# Patient Record
Sex: Female | Born: 1950 | Race: White | Hispanic: No | Smoking: Never smoker
Health system: Southern US, Community
[De-identification: ages and names within clinical notes are randomized; demographics above are authoritative.]

## PROBLEM LIST (undated history)

## (undated) DIAGNOSIS — I10 Essential (primary) hypertension: Secondary | ICD-10-CM

## (undated) DIAGNOSIS — Z8619 Personal history of other infectious and parasitic diseases: Secondary | ICD-10-CM

## (undated) DIAGNOSIS — K219 Gastro-esophageal reflux disease without esophagitis: Secondary | ICD-10-CM

## (undated) DIAGNOSIS — Z87442 Personal history of urinary calculi: Secondary | ICD-10-CM

## (undated) DIAGNOSIS — G43909 Migraine, unspecified, not intractable, without status migrainosus: Secondary | ICD-10-CM

## (undated) DIAGNOSIS — F32A Depression, unspecified: Secondary | ICD-10-CM

## (undated) HISTORY — DX: Migraine, unspecified, not intractable, without status migrainosus: G43.909

## (undated) HISTORY — PX: ABDOMINAL HYSTERECTOMY: SHX81

## (undated) HISTORY — DX: Personal history of other infectious and parasitic diseases: Z86.19

## (undated) HISTORY — PX: APPENDECTOMY: SHX54

## (undated) HISTORY — DX: Depression, unspecified: F32.A

## (undated) HISTORY — PX: OTHER SURGICAL HISTORY: SHX169

---

## 2004-12-08 LAB — HM PAP SMEAR: HM Pap smear: NEGATIVE

## 2009-04-20 ENCOUNTER — Emergency Department: Payer: Self-pay | Admitting: Emergency Medicine

## 2013-09-10 LAB — HEPATIC FUNCTION PANEL
ALT: 29 U/L (ref 7–35)
AST: 30 U/L (ref 13–35)

## 2013-09-10 LAB — CBC AND DIFFERENTIAL
HCT: 48 % — AB (ref 36–46)
HEMOGLOBIN: 16.1 g/dL — AB (ref 12.0–16.0)
Platelets: 295 10*3/uL (ref 150–399)
WBC: 8.4 10^3/mL

## 2013-09-10 LAB — LIPID PANEL
Cholesterol: 232 mg/dL — AB (ref 0–200)
HDL: 51 mg/dL (ref 35–70)
LDL Cholesterol: 136 mg/dL
Triglycerides: 224 mg/dL — AB (ref 40–160)

## 2013-09-10 LAB — BASIC METABOLIC PANEL
BUN: 17 mg/dL (ref 4–21)
CREATININE: 0.9 mg/dL (ref 0.5–1.1)
GLUCOSE: 102 mg/dL
POTASSIUM: 5.4 mmol/L — AB (ref 3.4–5.3)
SODIUM: 143 mmol/L (ref 137–147)

## 2015-01-16 ENCOUNTER — Other Ambulatory Visit: Payer: Self-pay | Admitting: Family Medicine

## 2015-01-16 MED ORDER — LOVASTATIN 40 MG PO TABS: ORAL_TABLET | ORAL | Status: DC

## 2015-01-16 NOTE — Telephone Encounter (Signed)
Pt stated that she forgot about needing to get her labs done. Pt stated she promises to get her labs done next week. Pt would like the Lovastatin 40 mg sent [place] and she would like to get the lab slip next week. Pt request a call back to let her know the medication was called in and when her slip is ready for pick up. Thanks TNP

## 2015-01-17 ENCOUNTER — Telehealth: Payer: Self-pay

## 2015-01-17 ENCOUNTER — Telehealth: Payer: Self-pay | Admitting: Family Medicine

## 2015-01-17 DIAGNOSIS — E785 Hyperlipidemia, unspecified: Secondary | ICD-10-CM

## 2015-01-17 NOTE — Telephone Encounter (Signed)
Need more info. What blood work?

## 2015-01-17 NOTE — Telephone Encounter (Signed)
Patient request a lab slip to get blood work done. Call pt when ready for pickup

## 2015-01-20 NOTE — Telephone Encounter (Signed)
Pt called to see if lab slip was ready for pick up. Pt stated her understanding was in order to continue taking Lovastatin she needed to have blood work done and she thought it was time for the blood work to be done. Pt would like to get this done tomorrow morning if possible. Thanks TNP

## 2015-01-27 NOTE — Telephone Encounter (Signed)
Has she run out of lovastatin yet? We only prescribed 3 months worth in January, If she is out then need to start back before checking blood work. If she has not run out then go ahead and order lipid panel and ALT for hyperlipidemia. Thanks.

## 2015-01-28 NOTE — Telephone Encounter (Signed)
LMTCB 01/28/2015    Thanks,   -Subhan Hoopes  

## 2015-02-05 NOTE — Telephone Encounter (Signed)
Tried calling patient. Left message to call back. 

## 2015-02-13 NOTE — Telephone Encounter (Signed)
Left message to call back. Letter mailed to patient home.

## 2015-02-13 NOTE — Telephone Encounter (Signed)
Patient returned call from 01/17/2015 message. Patient states she is requesting a lab slip to have her routine labs done. Patient states she has been taking Lovastatin, and has not been out of the medication. Per Dr. Maralyn Sago note on 01/17/2015. Lipid panel and ALT labs ordered and lab slip is at the front desk for pick up. Patient states she will have labs done next week.

## 2015-02-19 LAB — LIPID PANEL
CHOL/HDL RATIO: 4.3 ratio (ref 0.0–4.4)
Cholesterol, Total: 203 mg/dL — ABNORMAL HIGH (ref 100–199)
HDL: 47 mg/dL (ref >39)
LDL Calculated: 111 mg/dL — ABNORMAL HIGH (ref 0–99)
Triglycerides: 225 mg/dL — ABNORMAL HIGH (ref 0–149)
VLDL Cholesterol Cal: 45 mg/dL — ABNORMAL HIGH (ref 5–40)

## 2015-02-19 LAB — ALT: ALT: 26 IU/L (ref 0–32)

## 2015-04-07 ENCOUNTER — Other Ambulatory Visit: Payer: Self-pay | Admitting: Family Medicine

## 2015-04-07 NOTE — Telephone Encounter (Signed)
Please call in clonazepam  

## 2015-04-08 NOTE — Telephone Encounter (Signed)
Please see if  Clonazepam has been called in. I thought this was done yesterday. Thanks.

## 2015-04-08 NOTE — Telephone Encounter (Signed)
Rx called in to pharmacy. 

## 2015-05-19 ENCOUNTER — Other Ambulatory Visit: Payer: Self-pay | Admitting: Family Medicine

## 2015-06-17 ENCOUNTER — Encounter: Payer: Self-pay | Admitting: Family Medicine

## 2015-06-17 ENCOUNTER — Ambulatory Visit (INDEPENDENT_AMBULATORY_CARE_PROVIDER_SITE_OTHER): Payer: 59 | Admitting: Family Medicine

## 2015-06-17 VITALS — BP 134/70 | HR 66 | Temp 98.5°F | Resp 16 | Ht 60.0 in | Wt 161.0 lb

## 2015-06-17 DIAGNOSIS — R51 Headache: Secondary | ICD-10-CM

## 2015-06-17 DIAGNOSIS — J309 Allergic rhinitis, unspecified: Secondary | ICD-10-CM | POA: Insufficient documentation

## 2015-06-17 DIAGNOSIS — G4700 Insomnia, unspecified: Secondary | ICD-10-CM | POA: Diagnosis not present

## 2015-06-17 DIAGNOSIS — H409 Unspecified glaucoma: Secondary | ICD-10-CM | POA: Insufficient documentation

## 2015-06-17 DIAGNOSIS — R519 Headache, unspecified: Secondary | ICD-10-CM | POA: Insufficient documentation

## 2015-06-17 DIAGNOSIS — F329 Major depressive disorder, single episode, unspecified: Secondary | ICD-10-CM | POA: Insufficient documentation

## 2015-06-17 DIAGNOSIS — E785 Hyperlipidemia, unspecified: Secondary | ICD-10-CM

## 2015-06-17 DIAGNOSIS — F32A Depression, unspecified: Secondary | ICD-10-CM | POA: Insufficient documentation

## 2015-06-17 DIAGNOSIS — Z8669 Personal history of other diseases of the nervous system and sense organs: Secondary | ICD-10-CM | POA: Insufficient documentation

## 2015-06-17 MED ORDER — LOVASTATIN 40 MG PO TABS
ORAL_TABLET | ORAL | Status: DC
Start: 2015-06-17 — End: 2016-03-09

## 2015-06-17 MED ORDER — CLONAZEPAM 1 MG PO TABS: 1.0000 mg | ORAL_TABLET | Freq: Every evening | ORAL | Status: DC | PRN

## 2015-06-17 NOTE — Progress Notes (Signed)
Patient: Gabrielle Barnes Female    DOB: [birth year]   64 y.o.   MRN: [id number] Visit Date: 06/17/2015  Today's Provider: Lelon Huh, MD   Chief Complaint  Patient presents with  . Insomnia    follow up   Subjective:    HPI   Lipid/Cholesterol, Follow-up:   Last seen for this10 months ago.  Management changes since that visit include no changes. . Last Lipid Panel:    Component Value Date/Time   CHOL 203* 02/18/2015 0809   CHOL 232* 09/10/2013   TRIG 225* 02/18/2015 0809   HDL 47 02/18/2015 0809   HDL 51 09/10/2013   CHOLHDL 4.3 02/18/2015 0809   LDLCALC 111* 02/18/2015 0809   LDLCALC 136 09/10/2013    Risk factors for vascular disease include hypercholesterolemia  She reports good compliance with treatment. She is not having side effects.  Current symptoms include none and have been stable. Weight trend: stable Prior visit with dietician: no Current diet: in general, a "healthy" diet   Current exercise: none  Wt Readings from Last 3 Encounters:  06/17/15 161 lb (73.029 kg)  11/25/14 160 lb (72.576 kg)    ------------------------------------------------------------------- Follow up Insomnia: Last office visit was 10 months ago and no changes were made. Patient reports good compliance with treatment, good tolerance and good symptom control. She takes clonazepam most nights and states it usually works well.      No Known Allergies Previous Medications   ASPIRIN 81 MG TABLET    Take 1 tablet by mouth daily.   CALCIUM CARBONATE (CALCIUM 600 PO)    Take 1 tablet by mouth daily.   CLONAZEPAM (KLONOPIN) 1 MG TABLET    TAKE ONE TABLET BY MOUTH AT BEDTIME AS NEEDED   LOVASTATIN (MEVACOR) 40 MG TABLET    TAKE TWO TABLETS BY MOUTH EVERY NIGHT AT BEDTIME    Review of Systems  Constitutional: Negative for fever, chills, appetite change and fatigue.  Respiratory: Negative for chest tightness and shortness of breath.   Cardiovascular: Negative for chest pain  and palpitations.  Gastrointestinal: Negative for nausea, vomiting and abdominal pain.  Musculoskeletal: Positive for myalgias (shooting pain in right leg; now resolved after eating bananas).  Neurological: Negative for dizziness and weakness.    Social History  Substance Use Topics  . Smoking status: Never Smoker   . Smokeless tobacco: Not on file  . Alcohol Use: No   Objective:   BP 134/70 mmHg  Pulse 66  Temp(Src) 98.5 F (36.9 C) (Oral)  Resp 16  Ht 5' (1.524 m)  Wt 161 lb (73.029 kg)  BMI 31.44 kg/m2  SpO2 96%  Physical Exam  General appearance: alert, well developed, well nourished, cooperative and in no distress Head: Normocephalic, without obvious abnormality, atraumatic Lungs: Respirations even and unlabored Extremities: No gross deformities Skin: Skin color, texture, turgor normal. No rashes seen  Psych: Appropriate mood and affect. Neurologic: Mental status: Alert, oriented to person, place, and time, thought content appropriate.     Assessment & Plan:     1. HLD (hyperlipidemia) She is tolerating lovastatin well with no adverse effects.  Continue current medications.   - lovastatin (MEVACOR) 40 MG tablet; Take 2 tablets every evening  Dispense: 60 tablet; Refill: 8  2. Insomnia Doing well on clonazepam - clonazePAM (KLONOPIN) 1 MG tablet; Take 1 tablet (1 mg total) by mouth at bedtime as needed.  Dispense: 30 tablet; Refill: 5       Elenore Rota  Caryn Section, MD  [facility]

## 2016-03-09 ENCOUNTER — Other Ambulatory Visit: Payer: Self-pay | Admitting: Family Medicine

## 2016-03-09 DIAGNOSIS — E785 Hyperlipidemia, unspecified: Secondary | ICD-10-CM

## 2016-03-30 DIAGNOSIS — H40003 Preglaucoma, unspecified, bilateral: Secondary | ICD-10-CM | POA: Diagnosis not present

## 2016-09-09 ENCOUNTER — Other Ambulatory Visit: Payer: Self-pay | Admitting: Family Medicine

## 2016-09-09 DIAGNOSIS — E785 Hyperlipidemia, unspecified: Secondary | ICD-10-CM

## 2016-09-27 DIAGNOSIS — H2513 Age-related nuclear cataract, bilateral: Secondary | ICD-10-CM | POA: Diagnosis not present

## 2016-10-14 ENCOUNTER — Other Ambulatory Visit: Payer: Self-pay | Admitting: Family Medicine

## 2016-10-14 DIAGNOSIS — E781 Pure hyperglyceridemia: Secondary | ICD-10-CM

## 2016-10-14 MED ORDER — LOVASTATIN 40 MG PO TABS: ORAL_TABLET | ORAL | 0 refills | Status: DC

## 2016-10-14 NOTE — Telephone Encounter (Signed)
Pt contacted office for refill request on the following medications:  lovastatin (MEVACOR) 40 MG tablet.  [facility].  CB#[phone]/MW  Pt has appointment scheduled.  Pt does not want to com in until flu season is over/MW

## 2016-11-22 ENCOUNTER — Encounter: Payer: Self-pay | Admitting: Family Medicine

## 2016-11-22 ENCOUNTER — Ambulatory Visit (INDEPENDENT_AMBULATORY_CARE_PROVIDER_SITE_OTHER): Payer: PPO | Admitting: Family Medicine

## 2016-11-22 VITALS — BP 136/82 | HR 66 | Temp 99.0°F | Resp 16 | Wt 158.0 lb

## 2016-11-22 DIAGNOSIS — E2839 Other primary ovarian failure: Secondary | ICD-10-CM

## 2016-11-22 DIAGNOSIS — Z1231 Encounter for screening mammogram for malignant neoplasm of breast: Secondary | ICD-10-CM | POA: Diagnosis not present

## 2016-11-22 DIAGNOSIS — Z23 Encounter for immunization: Secondary | ICD-10-CM | POA: Diagnosis not present

## 2016-11-22 DIAGNOSIS — F5101 Primary insomnia: Secondary | ICD-10-CM

## 2016-11-22 DIAGNOSIS — E7800 Pure hypercholesterolemia, unspecified: Secondary | ICD-10-CM | POA: Diagnosis not present

## 2016-11-22 DIAGNOSIS — Z1239 Encounter for other screening for malignant neoplasm of breast: Secondary | ICD-10-CM

## 2016-11-22 MED ORDER — CLONAZEPAM 1 MG PO TABS: 1.0000 mg | ORAL_TABLET | Freq: Every evening | ORAL | 5 refills | Status: DC | PRN

## 2016-11-22 NOTE — Progress Notes (Signed)
Mammogram and bone density appointment scheduled at [facility] on 11/29/2016 at 9:10am. Patient advised.

## 2016-11-22 NOTE — Patient Instructions (Signed)
Pneumococcal Conjugate Vaccine (PCV13) What You Need to Know 1. Why get vaccinated? Vaccination can protect both children and adults from pneumococcal disease. Pneumococcal disease is caused by bacteria that can spread from person to person through close contact. It can cause ear infections, and it can also lead to more serious infections of the:  Lungs (pneumonia),  Blood (bacteremia), and  Covering of the brain and spinal cord (meningitis).  Pneumococcal pneumonia is most common among adults. Pneumococcal meningitis can cause deafness and brain damage, and it kills about 1 child in 10 who get it. Anyone can get pneumococcal disease, but children under 2 years of age and adults 65 years and older, people with certain medical conditions, and cigarette smokers are at the highest risk. Before there was a vaccine, the [place] saw:  more than 700 cases of meningitis,  about 13,000 blood infections,  about 5 million ear infections, and  about 200 deaths  in children under 5 each year from pneumococcal disease. Since vaccine became available, severe pneumococcal disease in these children has fallen by 88%. About 18,000 older adults die of pneumococcal disease each year in the [place]. Treatment of pneumococcal infections with penicillin and other drugs is not as effective as it used to be, because some strains of the disease have become resistant to these drugs. This makes prevention of the disease, through vaccination, even more important. 2. PCV13 vaccine Pneumococcal conjugate vaccine (called PCV13) protects against 13 types of pneumococcal bacteria. PCV13 is routinely given to children at 2, 4, 6, and 12-15 months of age. It is also recommended for children and adults 2 to 64 years of age with certain health conditions, and for all adults 65 years of age and older. Your doctor can give you details. 3. Some people should not get this vaccine Anyone who has ever had a  life-threatening allergic reaction to a dose of this vaccine, to an earlier pneumococcal vaccine called PCV7, or to any vaccine containing diphtheria toxoid (for example, DTaP), should not get PCV13. Anyone with a severe allergy to any component of PCV13 should not get the vaccine. Tell your doctor if the person being vaccinated has any severe allergies. If the person scheduled for vaccination is not feeling well, your healthcare provider might decide to reschedule the shot on another day. 4. Risks of a vaccine reaction With any medicine, including vaccines, there is a chance of reactions. These are usually mild and go away on their own, but serious reactions are also possible. Problems reported following PCV13 varied by age and dose in the series. The most common problems reported among children were:  About half became drowsy after the shot, had a temporary loss of appetite, or had redness or tenderness where the shot was given.  About 1 out of 3 had swelling where the shot was given.  About 1 out of 3 had a mild fever, and about 1 in 20 had a fever over 102.2F.  Up to about 8 out of 10 became fussy or irritable.  Adults have reported pain, redness, and swelling where the shot was given; also mild fever, fatigue, headache, chills, or muscle pain. Young children who get PCV13 along with inactivated flu vaccine at the same time may be at increased risk for seizures caused by fever. Ask your doctor for more information. Problems that could happen after any vaccine:  People sometimes faint after a medical procedure, including vaccination. Sitting or lying down for about 15 minutes can help prevent   fainting, and injuries caused by a fall. Tell your doctor if you feel dizzy, or have vision changes or ringing in the ears.  Some older children and adults get severe pain in the shoulder and have difficulty moving the arm where a shot was given. This happens very rarely.  Any medication can cause a  severe allergic reaction. Such reactions from a vaccine are very rare, estimated at about 1 in a million doses, and would happen within a few minutes to a few hours after the vaccination. As with any medicine, there is a very small chance of a vaccine causing a serious injury or death. The safety of vaccines is always being monitored. For more information, visit: www.cdc.gov/vaccinesafety/ 5. What if there is a serious reaction? What should I look for? Look for anything that concerns you, such as signs of a severe allergic reaction, very high fever, or unusual behavior. Signs of a severe allergic reaction can include hives, swelling of the face and throat, difficulty breathing, a fast heartbeat, dizziness, and weakness-usually within a few minutes to a few hours after the vaccination. What should I do?  If you think it is a severe allergic reaction or other emergency that can't wait, call 9-1-1 or get the person to the nearest hospital. Otherwise, call your doctor.  Reactions should be reported to the Vaccine Adverse Event Reporting System (VAERS). Your doctor should file this report, or you can do it yourself through the VAERS web site at www.vaers.hhs.gov, or by calling 1-800-822-7967. ? VAERS does not give medical advice. 6. The National Vaccine Injury Compensation Program The National Vaccine Injury Compensation Program (VICP) is a federal program that was created to compensate people who may have been injured by certain vaccines. Persons who believe they may have been injured by a vaccine can learn about the program and about filing a claim by calling 1-800-338-2382 or visiting the VICP website at www.hrsa.gov/vaccinecompensation. There is a time limit to file a claim for compensation. 7. How can I learn more?  Ask your healthcare provider. He or she can give you the vaccine package insert or suggest other sources of information.  Call your local or state health department.  Contact the  Centers for Disease Control and Prevention (CDC): ? Call 1-800-232-4636 (1-800-CDC-INFO) or ? Visit CDC's website at www.cdc.gov/vaccines Vaccine Information Statement, PCV13 Vaccine (06/13/2014) This information is not intended to replace advice given to you by your health care provider. Make sure you discuss any questions you have with your health care provider. Document Released: 05/23/2006 Document Revised: 04/15/2016 Document Reviewed: 04/15/2016 Elsevier Interactive Patient Education  2017 Elsevier Inc.  

## 2016-11-22 NOTE — Progress Notes (Signed)
Patient: Gabrielle Barnes Female    DOB: May 08, 1951   66 y.o.   MRN: [id number] Visit Date: 11/22/2016  Today's Provider: Lelon Huh, MD   Chief Complaint  Patient presents with  . Hyperlipidemia    follow up  . Insomnia    follow up   Subjective:    HPI  Lipid/Cholesterol, Follow-up:   Last seen for this more than 1 years ago.  Management changes since that visit include none. . Last Lipid Panel:    Component Value Date/Time   CHOL 203 (H) 02/18/2015 0809   TRIG 225 (H) 02/18/2015 0809   HDL 47 02/18/2015 0809   CHOLHDL 4.3 02/18/2015 0809   LDLCALC 111 (H) 02/18/2015 0809    Risk factors for vascular disease include hypercholesterolemia  She reports good compliance with treatment. She is not having side effects.  Current symptoms include none and have been stable. Weight trend: fluctuating a bit Prior visit with dietician: no Current diet: in general, a "healthy" diet   Current exercise: yard work  [employer] from Last 3 Encounters:  06/17/15 161 lb (73 kg)  11/25/14 160 lb (72.6 kg)    ------------------------------------------------------------------- Follow up Insomnia:  Patient was last seen for this problem over 1 year ago and no changes were made. Patient has been out of Clonazepam for the past 6 months. She reports that her sleeping has worsened. She wakes up several times during the night.     No Known Allergies   Current Outpatient Prescriptions:  .  aspirin 81 MG tablet, Take 1 tablet by mouth daily., Disp: , Rfl:  .  Calcium Carbonate (CALCIUM 600 PO), Take 1 tablet by mouth daily., Disp: , Rfl:  .  clonazePAM (KLONOPIN) 1 MG tablet, Take 1 tablet (1 mg total) by mouth at bedtime as needed., Disp: 30 tablet, Rfl: 5 .  lovastatin (MEVACOR) 40 MG tablet, TAKE 2 TABLETS BY MOUTH EACH EVENING, Disp: 60 tablet, Rfl: 0  Review of Systems  Constitutional: Negative for appetite change, chills, fatigue and fever.  Respiratory: Negative for  chest tightness.   Cardiovascular: Negative for palpitations.  Gastrointestinal: Negative for abdominal pain, nausea and vomiting.  Endocrine: Negative for cold intolerance, heat intolerance, polydipsia, polyphagia and polyuria.  Neurological: Negative for dizziness and weakness.    Social History  Substance Use Topics  . Smoking status: Never Smoker  . Smokeless tobacco: Never Used  . Alcohol use No   Objective:   BP 136/82 (BP Location: Left Arm, Patient Position: Sitting, Cuff Size: Normal)   Pulse 66   Temp 99 F (37.2 C) (Oral)   Resp 16   Wt 158 lb (71.7 kg)   SpO2 96% Comment: room air  BMI 30.86 kg/m  There were no vitals filed for this visit.   Physical Exam  General appearance: alert, well developed, well nourished, cooperative and in no distress Head: Normocephalic, without obvious abnormality, atraumatic Respiratory: Respirations even and unlabored, normal respiratory rate Extremities: No gross deformities Skin: Skin color, texture, turgor normal. No rashes seen  Psych: Appropriate mood and affect. Neurologic: Mental status: Alert, oriented to person, place, and time, thought content appropriate.     Assessment & Plan:     1. Pure hypercholesterolemia She is tolerating lovastatin well with no adverse effects.   - Lipid panel - Comprehensive metabolic panel  2. Need for pneumococcal vaccination She refused pneumococcal vaccine today.   3. Primary insomnia Needs refill clonazepam. counseled potential adverse effects  and avoid taking on regular basis.  - clonazePAM (KLONOPIN) 1 MG tablet; Take 1 tablet (1 mg total) by mouth at bedtime as needed.  Dispense: 30 tablet; Refill: 5  4. Estrogen deficiency  - DG Bone Density; Future  5. Breast cancer screening  - MM Digital Screening; Future       Lelon Huh, MD  [facility]

## 2016-11-23 ENCOUNTER — Other Ambulatory Visit: Payer: Self-pay

## 2016-11-23 DIAGNOSIS — E781 Pure hyperglyceridemia: Secondary | ICD-10-CM

## 2016-11-23 LAB — LIPID PANEL
CHOL/HDL RATIO: 4.2 ratio (ref 0.0–4.4)
Cholesterol, Total: 225 mg/dL — ABNORMAL HIGH (ref 100–199)
HDL: 53 mg/dL (ref >39)
LDL CALC: 141 mg/dL — AB (ref 0–99)
Triglycerides: 157 mg/dL — ABNORMAL HIGH (ref 0–149)
VLDL CHOLESTEROL CAL: 31 mg/dL (ref 5–40)

## 2016-11-23 LAB — COMPREHENSIVE METABOLIC PANEL
A/G RATIO: 1.3 (ref 1.2–2.2)
ALK PHOS: 76 IU/L (ref 39–117)
ALT: 20 IU/L (ref 0–32)
AST: 30 IU/L (ref 0–40)
Albumin: 4.4 g/dL (ref 3.6–4.8)
BILIRUBIN TOTAL: 0.3 mg/dL (ref 0.0–1.2)
BUN/Creatinine Ratio: 16 (ref 12–28)
BUN: 14 mg/dL (ref 8–27)
CHLORIDE: 102 mmol/L (ref 96–106)
CO2: 24 mmol/L (ref 18–29)
Calcium: 9.6 mg/dL (ref 8.7–10.3)
Creatinine, Ser: 0.89 mg/dL (ref 0.57–1.00)
GFR calc Af Amer: 79 mL/min/{1.73_m2} (ref >59)
GFR calc non Af Amer: 68 mL/min/{1.73_m2} (ref >59)
GLUCOSE: 114 mg/dL — AB (ref 65–99)
Globulin, Total: 3.3 g/dL (ref 1.5–4.5)
POTASSIUM: 4.7 mmol/L (ref 3.5–5.2)
Sodium: 146 mmol/L — ABNORMAL HIGH (ref 134–144)
Total Protein: 7.7 g/dL (ref 6.0–8.5)

## 2016-11-23 MED ORDER — LOVASTATIN 40 MG PO TABS: ORAL_TABLET | ORAL | 3 refills | Status: DC

## 2016-11-23 NOTE — Progress Notes (Signed)
Advised  ED 

## 2016-11-24 ENCOUNTER — Telehealth: Payer: Self-pay | Admitting: Family Medicine

## 2016-11-24 NOTE — Telephone Encounter (Signed)
Pt is requesting call to discuss labs.She wants to know what she needs to eat to lower her cholesterol

## 2016-11-25 NOTE — Telephone Encounter (Signed)
Returned call to pt. Will mail low-cholesterol diet to pt.

## 2016-11-29 DIAGNOSIS — M8588 Other specified disorders of bone density and structure, other site: Secondary | ICD-10-CM | POA: Diagnosis not present

## 2016-11-29 DIAGNOSIS — Z780 Asymptomatic menopausal state: Secondary | ICD-10-CM | POA: Diagnosis not present

## 2016-11-29 DIAGNOSIS — M810 Age-related osteoporosis without current pathological fracture: Secondary | ICD-10-CM | POA: Diagnosis not present

## 2016-11-29 DIAGNOSIS — Z1231 Encounter for screening mammogram for malignant neoplasm of breast: Secondary | ICD-10-CM | POA: Diagnosis not present

## 2016-11-29 DIAGNOSIS — E2839 Other primary ovarian failure: Secondary | ICD-10-CM | POA: Diagnosis not present

## 2016-11-29 LAB — HM MAMMOGRAPHY

## 2016-11-30 ENCOUNTER — Encounter: Payer: Self-pay | Admitting: *Deleted

## 2016-12-01 ENCOUNTER — Encounter: Payer: Self-pay | Admitting: Family Medicine

## 2016-12-01 DIAGNOSIS — M810 Age-related osteoporosis without current pathological fracture: Secondary | ICD-10-CM | POA: Insufficient documentation

## 2016-12-06 ENCOUNTER — Telehealth: Payer: Self-pay | Admitting: Family Medicine

## 2016-12-06 DIAGNOSIS — M810 Age-related osteoporosis without current pathological fracture: Secondary | ICD-10-CM

## 2016-12-06 DIAGNOSIS — M8580 Other specified disorders of bone density and structure, unspecified site: Secondary | ICD-10-CM

## 2016-12-06 NOTE — Telephone Encounter (Signed)
Pt is called to request the results from her bone density test.  CB#[phone]/MW

## 2016-12-06 NOTE — Telephone Encounter (Signed)
Called [facility] just now and requested bmd results, this was not in her chart yet-aa

## 2016-12-07 NOTE — Telephone Encounter (Signed)
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 Need to check TSH and vitamin D25-OH. Will probably need Fosamax, but need to see results of labs first    From DEXA 11-29-2016 FINDINGS: The bone mineral density in the spine measuring L1 to 4 measures 0.755 gm/cm2.TheZ score is -0.8 and the T score is -2.7.This value is below the fracture risk threshold.This represents a significant decrease of 9.1% when compared with the recent measurement of 0.830 gm/cm2 and a significant decrease of 7.7% when compared with a baseline measurement of 0.817 gm/cm2.  The total bone mineral density in the proximal left femur measures 0.893 gm/cm2.The Z score is 0.9 and the T score is -0.4.This represents a significant decrease of 4.1% when compared to the recent measurement of 0.931 gm/cm2 and an insignificant increase of 1.2% when compared with the baseline measurement of 0.883 gm/cm2.The femoral neck density is 0.621 gm/cm2, and the femoral neck T score is -2.1.The other T scores range from 0.3 to -0.4. Except for the femoral neck, these values are above the fracture risk thresholds.

## 2016-12-08 NOTE — Telephone Encounter (Signed)
Patient was busy. She will cb shortly.

## 2016-12-09 NOTE — Telephone Encounter (Signed)
Patient advised and verbally voiced understanding. Order for labs placed. Lab slip placed up front for pick up.

## 2016-12-11 ENCOUNTER — Telehealth: Payer: Self-pay | Admitting: Family Medicine

## 2016-12-11 NOTE — Telephone Encounter (Signed)
Pt called stating that she is supposed to get some lab work done,not sure what you wanted to order.She would like to get this done on Monday morning.I advised that you would not see message until Monday and slip may not be ready until later in the day

## 2016-12-13 NOTE — Telephone Encounter (Signed)
Patient was notified that her lab slip is ready to pick-up.

## 2016-12-17 ENCOUNTER — Encounter: Payer: Self-pay | Admitting: Family Medicine

## 2016-12-20 DIAGNOSIS — M810 Age-related osteoporosis without current pathological fracture: Secondary | ICD-10-CM | POA: Diagnosis not present

## 2016-12-20 DIAGNOSIS — M8580 Other specified disorders of bone density and structure, unspecified site: Secondary | ICD-10-CM | POA: Diagnosis not present

## 2016-12-21 ENCOUNTER — Telehealth: Payer: Self-pay | Admitting: *Deleted

## 2016-12-21 DIAGNOSIS — M810 Age-related osteoporosis without current pathological fracture: Secondary | ICD-10-CM

## 2016-12-21 LAB — VITAMIN D 25 HYDROXY (VIT D DEFICIENCY, FRACTURES): VIT D 25 HYDROXY: 41.3 ng/mL (ref 30.0–100.0)

## 2016-12-21 LAB — TSH: TSH: 3.46 u[IU]/mL (ref 0.450–4.500)

## 2016-12-21 MED ORDER — ALENDRONATE SODIUM 70 MG PO TABS: 70.0000 mg | ORAL_TABLET | ORAL | 11 refills | Status: DC

## 2016-12-21 NOTE — Telephone Encounter (Signed)
-----   Message from Birdie Sons, MD sent at 12/21/2016  7:49 AM EDT ----- Thyroid and vitamin d levels are normal. Need to go ahead and start alendronate 70mg  once a week for osteoporosis. #12. rf x 12. Need to take otc calcium with vitamin D supplement, such as Oscal-D every day. Need to exercise 150 minutes a week. Including some weight bearing exercises 3 days a week. Repeat BMD in 2 years.

## 2016-12-21 NOTE — Telephone Encounter (Signed)
Patient was notified of results. Expressed understanding. Rx sent to pharmacy. 

## 2017-03-28 DIAGNOSIS — H40003 Preglaucoma, unspecified, bilateral: Secondary | ICD-10-CM | POA: Diagnosis not present

## 2017-04-06 ENCOUNTER — Telehealth: Payer: Self-pay | Admitting: Family Medicine

## 2017-08-04 ENCOUNTER — Encounter: Payer: Self-pay | Admitting: Family Medicine

## 2017-08-04 ENCOUNTER — Ambulatory Visit: Payer: PPO | Admitting: Family Medicine

## 2017-08-04 VITALS — BP 168/92 | HR 70 | Temp 98.8°F | Resp 16 | Wt 160.0 lb

## 2017-08-04 DIAGNOSIS — S39012A Strain of muscle, fascia and tendon of lower back, initial encounter: Secondary | ICD-10-CM | POA: Diagnosis not present

## 2017-08-04 MED ORDER — CYCLOBENZAPRINE HCL 5 MG PO TABS: 5.0000 mg | ORAL_TABLET | Freq: Three times a day (TID) | ORAL | 0 refills | Status: DC | PRN

## 2017-08-04 NOTE — Progress Notes (Signed)
Subjective:     Patient ID: Gabrielle Barnes, female   DOB: Jan 29, 1951, 66 y.o.   MRN: [id number] Chief Complaint  Patient presents with  . Back Pain    Pt reports that 5 days ago she was in her attic getting a box out of the attic. She woke up the next day with her back hurting and feeling like it is spasming. She reports that any kind of movement makes it worse. Her husband had to put her socks and shoes on for her this morning. She has taken Tylenol and used aspercream for the pain without much relief.    HPI States pain is triggered by changing positions and is not radiating.No previous hx of back injury or surgery. Accompanied by her husband today.  Review of Systems     Objective:   Physical Exam  Constitutional: She appears well-developed and well-nourished. She appears distressed (moderate back pain when changing positions).  Musculoskeletal:  Muscle strength in lower extremities 5/5. SLR's to 90 degrees without radiation of back pain. Localized to her right SI area.       Assessment:    1. Strain of lumbar region, initial encounter; cyclobenzaprine 5 mg. 3 x day prn #21     Plan:    Discussed use of Aleve, Tylenol, and heat. Work excuse for 12/27-12/29/18.

## 2017-08-04 NOTE — Patient Instructions (Signed)
Discussed use of two Aleve twice daily with food. May also take extra strength Tylenol up to 3000 mg/day. Use warm compresses for 20 minutes several x day

## 2017-09-24 ENCOUNTER — Encounter: Payer: Self-pay | Admitting: Family Medicine

## 2017-09-24 ENCOUNTER — Ambulatory Visit (INDEPENDENT_AMBULATORY_CARE_PROVIDER_SITE_OTHER): Payer: PPO | Admitting: Family Medicine

## 2017-09-24 VITALS — BP 144/78 | HR 79 | Temp 99.2°F | Resp 16 | Wt 156.0 lb

## 2017-09-24 DIAGNOSIS — R05 Cough: Secondary | ICD-10-CM

## 2017-09-24 DIAGNOSIS — R059 Cough, unspecified: Secondary | ICD-10-CM

## 2017-09-24 DIAGNOSIS — J40 Bronchitis, not specified as acute or chronic: Secondary | ICD-10-CM

## 2017-09-24 MED ORDER — HYDROCODONE-HOMATROPINE 5-1.5 MG/5ML PO SYRP: 5.0000 mL | ORAL_SOLUTION | Freq: Three times a day (TID) | ORAL | 0 refills | Status: DC | PRN

## 2017-09-24 MED ORDER — AZITHROMYCIN 250 MG PO TABS: ORAL_TABLET | ORAL | 0 refills | Status: AC

## 2017-09-24 NOTE — Patient Instructions (Signed)

## 2017-09-24 NOTE — Progress Notes (Signed)
Patient: Gabrielle Barnes Female    DOB: [birth year]   67 y.o.   MRN: [id number] Visit Date: 09/24/2017  Today's Provider: Lelon Huh, MD   Chief Complaint  Patient presents with  . URI   Subjective:    URI   This is a new problem. Episode onset: x 1 week. The problem has been unchanged. Maximum temperature: undocumented. Associated symptoms include congestion, coughing (dry), ear pain (bilateral), headaches, a plugged ear sensation (bilateral) and sinus pain. Pertinent negatives include no abdominal pain, chest pain, diarrhea, dysuria, nausea, neck pain, rhinorrhea, sneezing, sore throat, swollen glands, vomiting or wheezing. Treatments tried: Delsym, Tylenol. The treatment provided no relief.  States cough has been keeping her up all night      No Known Allergies   Current Outpatient Medications:  .  alendronate (FOSAMAX) 70 MG tablet, Take 1 tablet (70 mg total) by mouth every 7 (seven) days. Take with a full glass of water on an empty stomach., Disp: 4 tablet, Rfl: 11 .  aspirin 81 MG tablet, Take 1 tablet by mouth daily., Disp: , Rfl:  .  Calcium Carbonate (CALCIUM 600 PO), Take 1 tablet by mouth daily., Disp: , Rfl:  .  lovastatin (MEVACOR) 40 MG tablet, TAKE 2 TABLETS BY MOUTH EACH EVENING, Disp: 180 tablet, Rfl: 3 .  clonazePAM (KLONOPIN) 1 MG tablet, Take 1 tablet (1 mg total) by mouth at bedtime as needed. (Patient not taking: Reported on 09/24/2017), Disp: 30 tablet, Rfl: 5  Review of Systems  HENT: Positive for congestion, ear pain (bilateral) and sinus pain. Negative for rhinorrhea, sneezing and sore throat.   Respiratory: Positive for cough (dry). Negative for wheezing.   Cardiovascular: Negative for chest pain.  Gastrointestinal: Negative for abdominal pain, diarrhea, nausea and vomiting.  Genitourinary: Negative for dysuria.  Musculoskeletal: Negative for neck pain.  Neurological: Positive for headaches.    Social History   Tobacco Use  . Smoking  status: Never Smoker  . Smokeless tobacco: Never Used  Substance Use Topics  . Alcohol use: No    Alcohol/week: 0.0 oz   Objective:   BP (!) 144/78 (BP Location: Right Arm, Patient Position: Sitting, Cuff Size: Large)   Pulse 79   Temp 99.2 F (37.3 C) (Oral)   Resp 16   Wt 156 lb (70.8 kg)   SpO2 95%   BMI 30.47 kg/m  Vitals:   09/24/17 1101  BP: (!) 144/78  Pulse: 79  Resp: 16  Temp: 99.2 F (37.3 C)  TempSrc: Oral  SpO2: 95%  Weight: 156 lb (70.8 kg)     Physical Exam  General Appearance:    Alert, cooperative, no distress  HENT:   left TM fluid noted, neck without nodes, throat normal without erythema or exudate, sinuses nontender and nasal mucosa pale and congested  Eyes:    PERRL, conjunctiva/corneas clear, EOM's intact       Lungs:     Occasional expiratory wheeze, no rales,  respirations unlabored  Heart:    Regular rate and rhythm  Neurologic:   Awake, alert, oriented x 3. No apparent focal neurological           defect.           Assessment & Plan:     1. Cough  - HYDROcodone-homatropine (HYCODAN) 5-1.5 MG/5ML syrup; Take 5 mLs by mouth every 8 (eight) hours as needed.  Dispense: 100 mL; Refill: 0  2. Bronchitis  - azithromycin (ZITHROMAX) 250  MG tablet; 2 by mouth today, then 1 daily for 4 days  Dispense: 6 tablet; Refill: 0  Recommended flu vaccine which she declined.       Lelon Huh, MD  [facility]

## 2017-09-26 DIAGNOSIS — H40003 Preglaucoma, unspecified, bilateral: Secondary | ICD-10-CM | POA: Diagnosis not present

## 2017-10-04 ENCOUNTER — Telehealth: Payer: Self-pay | Admitting: Family Medicine

## 2017-10-04 DIAGNOSIS — R059 Cough, unspecified: Secondary | ICD-10-CM

## 2017-10-04 DIAGNOSIS — R05 Cough: Secondary | ICD-10-CM

## 2017-10-04 MED ORDER — HYDROCODONE-HOMATROPINE 5-1.5 MG/5ML PO SYRP: 5.0000 mL | ORAL_SOLUTION | Freq: Three times a day (TID) | ORAL | 0 refills | Status: AC | PRN

## 2017-10-04 MED ORDER — DOXYCYCLINE HYCLATE 100 MG PO TABS: 100.0000 mg | ORAL_TABLET | Freq: Two times a day (BID) | ORAL | 0 refills | Status: AC

## 2017-10-04 NOTE — Telephone Encounter (Signed)
Have sent prescription for cough syrup and a different antibiotic to [facility].

## 2017-10-04 NOTE — Telephone Encounter (Signed)
Advised patient as below.  

## 2017-10-04 NOTE — Telephone Encounter (Signed)
Please review. Thanks!  

## 2017-10-04 NOTE — Telephone Encounter (Signed)
Patient states she was seen Feb. 16th.    She is still coughing "her head off" and wheezing when she goes to bed.    She wants another refill of Hycodan.  [facility] [place]  Please let patient know when or if this is done.

## 2017-10-26 ENCOUNTER — Other Ambulatory Visit: Payer: Self-pay | Admitting: Family Medicine

## 2017-10-26 DIAGNOSIS — M810 Age-related osteoporosis without current pathological fracture: Secondary | ICD-10-CM

## 2017-10-26 NOTE — Telephone Encounter (Signed)
[facility] for following medications:alendronate (FOSAMAX) 70 MG tablet      Please advise,Thanks [place]

## 2017-10-27 MED ORDER — ALENDRONATE SODIUM 70 MG PO TABS: 70.0000 mg | ORAL_TABLET | ORAL | 11 refills | Status: DC

## 2017-11-10 NOTE — Telephone Encounter (Signed)
complete

## 2017-11-23 ENCOUNTER — Other Ambulatory Visit: Payer: Self-pay | Admitting: Family Medicine

## 2017-11-23 DIAGNOSIS — E781 Pure hyperglyceridemia: Secondary | ICD-10-CM

## 2018-02-14 ENCOUNTER — Other Ambulatory Visit: Payer: Self-pay | Admitting: Family Medicine

## 2018-02-14 DIAGNOSIS — E781 Pure hyperglyceridemia: Secondary | ICD-10-CM

## 2018-02-14 NOTE — Telephone Encounter (Signed)
LM with Jeneen Rinks that patient needs to call to schedule for an appointment this month to follow-up and labs.  Thanks,  -Joseline

## 2018-02-14 NOTE — Telephone Encounter (Signed)
Have sent refill, but patient is due for follow up and labs, and needs to schedule this month.

## 2018-02-28 NOTE — Progress Notes (Signed)
Patient: Gabrielle Barnes Female    DOB: 05-Apr-1951   67 y.o.   MRN: [id number] Visit Date: 03/01/2018  Today's Provider: Lelon Huh, MD   Chief Complaint  Patient presents with  . Hyperlipidemia  . Insomnia  . Osteoporosis   Subjective:    HPI   Lipid/Cholesterol, Follow-up:   Last seen for this 3 months ago.  Management since that visit includes; labs checked. Recommended avoiding saturated fats and continue Lovastatin 40 mg.  Last Lipid Panel: Lab Results  Component Value Date   CHOL 225 (H) 11/22/2016   HDL 53 11/22/2016   LDLCALC 141 (H) 11/22/2016   TRIG 157 (H) 11/22/2016   CHOLHDL 4.2 11/22/2016    She reports good compliance with treatment. She is not having side effects.   Wt Readings from Last 3 Encounters:  03/01/18 156 lb 12.8 oz (71.1 kg)  09/24/17 156 lb (70.8 kg)  08/04/17 160 lb (72.6 kg)    ------------------------------------------------------------------------  Follow  Up insomnia Has taken clonazepam in the past, which has been effective, and well tolerated. She has not had to take recently and not had to have refill since last year.   Osteoporosis From 12/21/2016-started alendronate 70 mg weekly. Patient reports good compliance with treatment plan. Is having no adverse from alendronate.   Seborhea She reports she occasionally has very itch ears and was prescribed mometasone by her ENT a few years a go. She requests refill for medication.   She is also overdue for colon cancer screening and does not want a colonoscopy.   No Known Allergies   Current Outpatient Medications:  .  alendronate (FOSAMAX) 70 MG tablet, Take 1 tablet (70 mg total) by mouth every 7 (seven) days. Take with a full glass of water on an empty stomach., Disp: 4 tablet, Rfl: 11 .  aspirin 81 MG tablet, Take 1 tablet by mouth daily., Disp: , Rfl:  .  Calcium Carbonate (CALCIUM 600 PO), Take 1 tablet by mouth daily., Disp: , Rfl:  .  lovastatin (MEVACOR) 40 MG  tablet, TAKE 2 TABLETS BY MOUTH EVERY EVENING, Disp: 180 tablet, Rfl: 0 .  clonazePAM (KLONOPIN) 1 MG tablet, Take 1 tablet (1 mg total) by mouth at bedtime as needed. (Patient not taking: Reported on 09/24/2017), Disp: 30 tablet, Rfl: 5 .  dorzolamide-timolol (COSOPT) 22.3-6.8 MG/ML ophthalmic solution, INSTILL 1 DROP INTO BOTH EYES 2 TIMES DAILY, Disp: , Rfl: 4  Review of Systems  Constitutional: Negative for appetite change, chills, fatigue and fever.  Respiratory: Negative for chest tightness and shortness of breath.   Cardiovascular: Negative for chest pain and palpitations.  Gastrointestinal: Negative for abdominal pain, nausea and vomiting.  Neurological: Negative for dizziness and weakness.    Social History   Tobacco Use  . Smoking status: Never Smoker  . Smokeless tobacco: Never Used  Substance Use Topics  . Alcohol use: No    Alcohol/week: 0.0 oz   Objective:   BP (!) 142/74 (BP Location: Left Arm, Patient Position: Sitting, Cuff Size: Normal)   Pulse (!) 57   Temp 98.6 F (37 C) (Oral)   Resp 14   Ht 5' (1.524 m)   Wt 156 lb 12.8 oz (71.1 kg)   SpO2 99%   BMI 30.62 kg/m  Vitals:   03/01/18 0900  BP: (!) 142/74  Pulse: (!) 57  Resp: 14  Temp: 98.6 F (37 C)  TempSrc: Oral  SpO2: 99%  Weight: 156 lb 12.8 oz (71.1 kg)  Height: 5' (1.524 m)     Physical Exam  General appearance: alert, well developed, well nourished, cooperative and in no distress Head: Normocephalic, without obvious abnormality, atraumatic Respiratory: Respirations even and unlabored, normal respiratory rate Extremities: No gross deformities Skin: Skin color, texture, turgor normal. No rashes seen  Psych: Appropriate mood and affect. Neurologic: Mental status: Alert, oriented to person, place, and time, thought content appropriate.     Assessment & Plan:     1. Pure hypercholesterolemia She is tolerating lovastatin well with no adverse effects.   - Comprehensive metabolic panel -  Lipid panel  2. Osteoporosis, unspecified osteoporosis type, unspecified pathological fracture presence Tolerating alendronate well and taking consistently. BMD in 2020 - VITAMIN D 25 Hydroxy (Vit-D Deficiency, Fractures)  3. Seborrheic dermatitis Responds well to - mometasone (ELOCON) 0.1 % lotion; Apply topically daily. To ears, as needed  Dispense: 30 mL; Refill: 1, which was originally prescribed by ENT and is refilled today.   4. Need for hepatitis C screening test  - Hepatitis C Antibody  5. Colon cancer screening Counseled on various screening methods and she agrees to do Cologuard today.  - Cologuard  6. Insomnia, unspecified type Has taken clonazepam occasionally in the past, but not need to take at this time  Counseled patient on recommendations for Shingles vaccine and pneumonia vaccine, which she declined today, but advised she get from pharmacy if she changes her mind.        Lelon Huh, MD  [facility]

## 2018-03-01 ENCOUNTER — Ambulatory Visit: Payer: PPO | Admitting: Family Medicine

## 2018-03-01 ENCOUNTER — Encounter: Payer: Self-pay | Admitting: Family Medicine

## 2018-03-01 VITALS — BP 142/74 | HR 57 | Temp 98.6°F | Resp 14 | Ht 60.0 in | Wt 156.8 lb

## 2018-03-01 DIAGNOSIS — E7800 Pure hypercholesterolemia, unspecified: Secondary | ICD-10-CM | POA: Diagnosis not present

## 2018-03-01 DIAGNOSIS — M810 Age-related osteoporosis without current pathological fracture: Secondary | ICD-10-CM

## 2018-03-01 DIAGNOSIS — Z1159 Encounter for screening for other viral diseases: Secondary | ICD-10-CM | POA: Diagnosis not present

## 2018-03-01 DIAGNOSIS — Z1211 Encounter for screening for malignant neoplasm of colon: Secondary | ICD-10-CM

## 2018-03-01 DIAGNOSIS — G4700 Insomnia, unspecified: Secondary | ICD-10-CM | POA: Diagnosis not present

## 2018-03-01 DIAGNOSIS — L219 Seborrheic dermatitis, unspecified: Secondary | ICD-10-CM

## 2018-03-01 MED ORDER — MOMETASONE FUROATE 0.1 % EX SOLN: Freq: Every day | CUTANEOUS | 1 refills | Status: DC

## 2018-03-01 NOTE — Patient Instructions (Signed)
   The CDC recommends two doses of Shingrix (the shingles vaccine) separated by 2 to 6 months for adults age 67 years and older. I recommend checking with your insurance plan regarding coverage for this vaccine.    You are also due for a pneumonia vaccine which is covered 100% by your Medicare plan

## 2018-03-02 ENCOUNTER — Telehealth: Payer: Self-pay

## 2018-03-02 LAB — COMPREHENSIVE METABOLIC PANEL
ALBUMIN: 4.3 g/dL (ref 3.6–4.8)
ALT: 20 IU/L (ref 0–32)
AST: 20 IU/L (ref 0–40)
Albumin/Globulin Ratio: 1.4 (ref 1.2–2.2)
Alkaline Phosphatase: 65 IU/L (ref 39–117)
BUN / CREAT RATIO: 18 (ref 12–28)
BUN: 15 mg/dL (ref 8–27)
Bilirubin Total: 0.3 mg/dL (ref 0.0–1.2)
CALCIUM: 9.3 mg/dL (ref 8.7–10.3)
CO2: 25 mmol/L (ref 20–29)
CREATININE: 0.82 mg/dL (ref 0.57–1.00)
Chloride: 103 mmol/L (ref 96–106)
GFR calc Af Amer: 86 mL/min/{1.73_m2} (ref >59)
GFR, EST NON AFRICAN AMERICAN: 74 mL/min/{1.73_m2} (ref >59)
GLOBULIN, TOTAL: 3 g/dL (ref 1.5–4.5)
Glucose: 97 mg/dL (ref 65–99)
Potassium: 4.4 mmol/L (ref 3.5–5.2)
SODIUM: 142 mmol/L (ref 134–144)
TOTAL PROTEIN: 7.3 g/dL (ref 6.0–8.5)

## 2018-03-02 LAB — HEPATITIS C ANTIBODY: Hep C Virus Ab: <0.1 s/co ratio (ref 0.0–0.9)

## 2018-03-02 LAB — LIPID PANEL
Chol/HDL Ratio: 5.3 ratio — ABNORMAL HIGH (ref 0.0–4.4)
Cholesterol, Total: 240 mg/dL — ABNORMAL HIGH (ref 100–199)
HDL: 45 mg/dL (ref >39)
LDL Calculated: 123 mg/dL — ABNORMAL HIGH (ref 0–99)
TRIGLYCERIDES: 358 mg/dL — AB (ref 0–149)
VLDL Cholesterol Cal: 72 mg/dL — ABNORMAL HIGH (ref 5–40)

## 2018-03-02 LAB — VITAMIN D 25 HYDROXY (VIT D DEFICIENCY, FRACTURES): Vit D, 25-Hydroxy: 32.8 ng/mL (ref 30.0–100.0)

## 2018-03-02 NOTE — Telephone Encounter (Signed)
-----   Message from Birdie Sons, MD sent at 03/02/2018  9:49 AM EDT ----- Cholesterol is up a bit to 240. Rest of labs are normal. Cut back on saturated fats in diet. Continue current dose of lovastatin.  Check yearly.

## 2018-03-02 NOTE — Telephone Encounter (Signed)
Attempted to contact patient. No answer nor voicemail.  

## 2018-03-06 NOTE — Telephone Encounter (Signed)
LMTCB

## 2018-03-06 NOTE — Telephone Encounter (Signed)
Patient advised.

## 2018-03-13 DIAGNOSIS — Z1211 Encounter for screening for malignant neoplasm of colon: Secondary | ICD-10-CM | POA: Diagnosis not present

## 2018-03-13 DIAGNOSIS — Z1212 Encounter for screening for malignant neoplasm of rectum: Secondary | ICD-10-CM | POA: Diagnosis not present

## 2018-03-14 LAB — COLOGUARD: COLOGUARD: NEGATIVE

## 2018-03-27 DIAGNOSIS — H40003 Preglaucoma, unspecified, bilateral: Secondary | ICD-10-CM | POA: Diagnosis not present

## 2018-04-05 DIAGNOSIS — M4712 Other spondylosis with myelopathy, cervical region: Secondary | ICD-10-CM | POA: Diagnosis not present

## 2018-05-16 ENCOUNTER — Other Ambulatory Visit: Payer: Self-pay | Admitting: Family Medicine

## 2018-05-16 DIAGNOSIS — E781 Pure hyperglyceridemia: Secondary | ICD-10-CM

## 2018-09-25 DIAGNOSIS — H40003 Preglaucoma, unspecified, bilateral: Secondary | ICD-10-CM | POA: Diagnosis not present

## 2018-09-30 ENCOUNTER — Other Ambulatory Visit: Payer: Self-pay | Admitting: Family Medicine

## 2018-09-30 DIAGNOSIS — M810 Age-related osteoporosis without current pathological fracture: Secondary | ICD-10-CM

## 2018-10-03 ENCOUNTER — Other Ambulatory Visit: Payer: Self-pay | Admitting: Family Medicine

## 2018-10-03 DIAGNOSIS — M810 Age-related osteoporosis without current pathological fracture: Secondary | ICD-10-CM

## 2018-10-03 MED ORDER — ALENDRONATE SODIUM 70 MG PO TABS: 70.0000 mg | ORAL_TABLET | ORAL | 3 refills | Status: DC

## 2018-10-03 NOTE — Telephone Encounter (Signed)
Pt needing a refill on:  alendronate (FOSAMAX) 70 MG tablet pt is out.  Will get back to office when she gets done with all the cancer center appts with husband.  [facility] at:  [id number] [place] [place] - [address] [phone] (Phone) [phone] (Fax)   Thanks, [employer]

## 2018-10-11 ENCOUNTER — Telehealth: Payer: Self-pay

## 2018-10-11 MED ORDER — HYDROCODONE-HOMATROPINE 5-1.5 MG/5ML PO SYRP: 5.0000 mL | ORAL_SOLUTION | Freq: Three times a day (TID) | ORAL | 0 refills | Status: DC | PRN

## 2018-10-11 NOTE — Telephone Encounter (Signed)
Pt states she has had a cold for about a week.  She denies fever, chills, shortness of breath, wheezing.  She [facility].  Pt states she cannot come in for an appointment her husband was diagnosed with cancer.    Please advise.   Thanks,   -Mickel Baas

## 2019-01-02 ENCOUNTER — Other Ambulatory Visit: Payer: Self-pay

## 2019-01-02 DIAGNOSIS — M810 Age-related osteoporosis without current pathological fracture: Secondary | ICD-10-CM

## 2019-01-02 DIAGNOSIS — F5101 Primary insomnia: Secondary | ICD-10-CM

## 2019-01-03 MED ORDER — CLONAZEPAM 1 MG PO TABS: 1.0000 mg | ORAL_TABLET | Freq: Every evening | ORAL | 5 refills | Status: DC | PRN

## 2019-01-03 NOTE — Telephone Encounter (Signed)
Got an alert that Actonel is available at a lower copay than Fosamax. Please check with patient and see if she has been paying more for Fosamax, if so I can change prescription to Actonel which works just as well.

## 2019-01-09 MED ORDER — ALENDRONATE SODIUM 70 MG PO TABS: 70.0000 mg | ORAL_TABLET | ORAL | 3 refills | Status: DC

## 2019-01-09 NOTE — Telephone Encounter (Signed)
Pt states she has enough Fosamax to last three months, but would like to change to Actonel when her current prescription runs out.   Thanks,   -Mickel Baas

## 2019-05-27 ENCOUNTER — Other Ambulatory Visit: Payer: Self-pay | Admitting: Family Medicine

## 2019-05-27 DIAGNOSIS — E781 Pure hyperglyceridemia: Secondary | ICD-10-CM

## 2019-06-29 ENCOUNTER — Other Ambulatory Visit: Payer: Self-pay

## 2019-07-02 ENCOUNTER — Other Ambulatory Visit: Payer: Self-pay | Admitting: Family Medicine

## 2019-07-02 DIAGNOSIS — M810 Age-related osteoporosis without current pathological fracture: Secondary | ICD-10-CM

## 2019-07-02 DIAGNOSIS — F5101 Primary insomnia: Secondary | ICD-10-CM

## 2019-07-02 NOTE — Telephone Encounter (Signed)
Klonopin is not a delegated medication, and Fosamax has suggestions so I will forward both to practice.

## 2019-07-02 NOTE — Telephone Encounter (Signed)
Medication Refill - Medication: clonazePAM (KLONOPIN) 1 MG tablet  alendronate (FOSAMAX) 70 MG tablet     Has the patient contacted their pharmacy? Yes - no refills left.  Pt states that her spouse just died and she would like to know if she can get a 90 day supply of this so she isn't having to go in and out. (Agent: If no, request that the patient contact the pharmacy for the refill.) (Agent: If yes, when and what did the pharmacy advise?)  Preferred Pharmacy (with phone number or street name):  [id number] [place], [address] [phone] (Phone) [phone] (Fax)   Agent: Please be advised that RX refills may take up to 3 business days. We ask that you follow-up with your pharmacy.

## 2019-07-03 ENCOUNTER — Other Ambulatory Visit: Payer: Self-pay | Admitting: Family Medicine

## 2019-07-03 DIAGNOSIS — F5101 Primary insomnia: Secondary | ICD-10-CM

## 2019-07-03 MED ORDER — ALENDRONATE SODIUM 70 MG PO TABS: 70.0000 mg | ORAL_TABLET | ORAL | 3 refills | Status: DC

## 2019-07-03 MED ORDER — CLONAZEPAM 1 MG PO TABS: 1.0000 mg | ORAL_TABLET | Freq: Every evening | ORAL | 5 refills | Status: DC | PRN

## 2019-07-03 NOTE — Telephone Encounter (Signed)
Requested medication (s) are due for refill today: yes  Requested medication (s) are on the active medication list:yes  Last refill: 05/30/2019  Future visit scheduled: no  Notes to clinic: not delegated   Requested Prescriptions  Pending Prescriptions Disp Refills   clonazePAM (KLONOPIN) 1 MG tablet [Pharmacy Med Name: CLONAZEPAM 1 MG TABLET] 30 tablet     Sig: Take 1 tablet (1 mg total) by mouth at bedtime as needed.     Not Delegated - Psychiatry:  Anxiolytics/Hypnotics Failed - 07/03/2019 10:27 AM      Failed - This refill cannot be delegated      Failed - Urine Drug Screen completed in last 360 days.      Failed - Valid encounter within last 6 months    Recent Outpatient Visits          1 year ago Pure hypercholesterolemia   [name], MD   1 year ago Cough   [name], MD   1 year ago Strain of lumbar region, initial encounter   [name], [name]   2 years ago Pure hypercholesterolemia   [name], MD   4 years ago HLD (hyperlipidemia)   [name], Kirstie Peri, MD

## 2019-08-21 ENCOUNTER — Other Ambulatory Visit: Payer: Self-pay | Admitting: Family Medicine

## 2019-08-21 DIAGNOSIS — E781 Pure hyperglyceridemia: Secondary | ICD-10-CM

## 2019-08-21 NOTE — Telephone Encounter (Signed)
Requested medication (s) are due for refill today: yes  Requested medication (s) are on the active medication list: yes  Last refill:  05/27/2019  Future visit scheduled: no  Notes to clinic:  no valid encounter within last 12 months Review for refill   Requested Prescriptions  Pending Prescriptions Disp Refills   lovastatin (MEVACOR) 40 MG tablet [Pharmacy Med Name: LOVASTATIN 40 MG TABLET] 180 tablet 0    Sig: TAKE 2 TABLETS BY MOUTH EVERY EVENING      Cardiovascular:  Antilipid - Statins Failed - 08/21/2019  1:50 AM      Failed - Total Cholesterol in normal range and within 360 days    Cholesterol, Total  Date Value Ref Range Status  03/01/2018 240 (H) 100 - 199 mg/dL Final          Failed - LDL in normal range and within 360 days    LDL Calculated  Date Value Ref Range Status  03/01/2018 123 (H) 0 - 99 mg/dL Final          Failed - HDL in normal range and within 360 days    HDL  Date Value Ref Range Status  03/01/2018 45 >39 mg/dL Final          Failed - Triglycerides in normal range and within 360 days    Triglycerides  Date Value Ref Range Status  03/01/2018 358 (H) 0 - 149 mg/dL Final          Failed - Valid encounter within last 12 months    Recent Outpatient Visits           1 year ago Pure hypercholesterolemia   [facility] Birdie Sons, MD   1 year ago Cough   [facility] Birdie Sons, MD   2 years ago Strain of lumbar region, initial encounter   [facility] [place], [name]   2 years ago Pure hypercholesterolemia   [facility] Birdie Sons, MD   4 years ago HLD (hyperlipidemia)   [place], Kirstie Peri, MD              Passed - Patient is not pregnant

## 2019-08-27 ENCOUNTER — Other Ambulatory Visit: Payer: Self-pay | Admitting: Family Medicine

## 2019-08-27 DIAGNOSIS — E781 Pure hyperglyceridemia: Secondary | ICD-10-CM

## 2019-09-11 DIAGNOSIS — H40003 Preglaucoma, unspecified, bilateral: Secondary | ICD-10-CM | POA: Diagnosis not present

## 2019-09-12 ENCOUNTER — Telehealth: Payer: Self-pay | Admitting: Family Medicine

## 2019-09-12 DIAGNOSIS — F5101 Primary insomnia: Secondary | ICD-10-CM

## 2019-09-12 MED ORDER — CLONAZEPAM 1 MG PO TABS: 1.0000 mg | ORAL_TABLET | Freq: Every evening | ORAL | 1 refills | Status: DC | PRN

## 2019-09-12 NOTE — Telephone Encounter (Signed)
Pt stated her clonazePAM (KLONOPIN) 1 MG tablet Is working and would like to know if she can take more. Scheduled first available appt on 09/24/19 but would like to know what Dr. Caryn Section recommends until then. She cannot sleep

## 2019-09-12 NOTE — Telephone Encounter (Signed)
error 

## 2019-09-12 NOTE — Telephone Encounter (Signed)
Patient of Dr. Caryn Section please review. KW

## 2019-09-13 NOTE — Telephone Encounter (Signed)
Patient would like for the nurse to call her regarding how much medication she can take for sleeping.  Please call patient to discuss at [phone]

## 2019-09-13 NOTE — Telephone Encounter (Signed)
Patient wants to know if she can take more than one tablet at bedtime to help her fall asleep. She states the current dose is not effective in helping her fall alseep. Please advise.

## 2019-09-13 NOTE — Telephone Encounter (Signed)
Refill has been sent.  °

## 2019-09-14 NOTE — Telephone Encounter (Addendum)
The maximum dose is 2mg . She could take 2 of the 1mg  tablets, but it's habit forming so she shouldn't take 2 every night. She could try taking 5-10mg  melatonin every night. It's not habit forming, and it does not interact with the clonazepam, so she could still take the clonazepam prn.

## 2019-09-14 NOTE — Telephone Encounter (Signed)
Patient advised as below. Patient verbalizes understanding and is in agreement with treatment plan.  

## 2019-09-24 ENCOUNTER — Ambulatory Visit (INDEPENDENT_AMBULATORY_CARE_PROVIDER_SITE_OTHER): Payer: PPO | Admitting: Family Medicine

## 2019-09-24 ENCOUNTER — Encounter: Payer: Self-pay | Admitting: Family Medicine

## 2019-09-24 DIAGNOSIS — F321 Major depressive disorder, single episode, moderate: Secondary | ICD-10-CM | POA: Diagnosis not present

## 2019-09-24 DIAGNOSIS — L219 Seborrheic dermatitis, unspecified: Secondary | ICD-10-CM

## 2019-09-24 DIAGNOSIS — K219 Gastro-esophageal reflux disease without esophagitis: Secondary | ICD-10-CM

## 2019-09-24 DIAGNOSIS — F5102 Adjustment insomnia: Secondary | ICD-10-CM | POA: Diagnosis not present

## 2019-09-24 MED ORDER — MOMETASONE FUROATE 0.1 % EX SOLN: Freq: Every day | CUTANEOUS | 1 refills | Status: DC

## 2019-09-24 MED ORDER — FAMOTIDINE 20 MG PO TABS: 20.0000 mg | ORAL_TABLET | Freq: Two times a day (BID) | ORAL | 2 refills | Status: DC

## 2019-09-24 MED ORDER — TRAZODONE HCL 100 MG PO TABS: 100.0000 mg | ORAL_TABLET | Freq: Every day | ORAL | 1 refills | Status: DC

## 2019-09-24 NOTE — Progress Notes (Signed)
Patient: Gabrielle Barnes Female    DOB: [birth year]   69 y.o.   MRN: [id number] Visit Date: 09/24/2019  Today's Provider: Lelon Huh, MD   Chief Complaint  Patient presents with  . Hyperlipidemia   Subjective:    Virtual Visit via Telephone Note  I connected with [name] on 09/24/19 at 10:00 AM EST by telephone and verified that I am speaking with the correct person using two identifiers.  Location: Patient: home Provider: [facility]   I discussed the limitations, risks, security and privacy concerns of performing an evaluation and management service by telephone and the availability of in person appointments. I also discussed with the patient that there may be a patient responsible charge related to this service. The patient expressed understanding and agreed to proceed.      HPI  Follow up for Insomnia:  The patient was last seen for this 1 years ago. Changes made at last visit include none.  She reports good compliance with treatment. She feels that condition is Worse. She states the current dose is not effective in helping her fall asleep. Patient has tried taking Melatonin along with Clonazepam, which she states helped some. Her husband passed away a few months ago and she has been somewhat depressed wince then.  She is not having side effects.   She also reports that she has been having 'indigestion' consisting of sensation of stomach contents rising into her chest after eating.   ------------------------------------------------------------------------------------  Lipid/Cholesterol, Follow-up:   Last seen for this more than 1 year ago.  Management changes since that visit include none; patient was advised to cut back on saturated fats in diet. . Last Lipid Panel:    Component Value Date/Time   CHOL 240 (H) 03/01/2018 0929   TRIG 358 (H) 03/01/2018 0929   HDL 45 03/01/2018 0929   CHOLHDL 5.3 (H) 03/01/2018 0929   LDLCALC 123 (H) 03/01/2018 [id number]     Risk factors for vascular disease include hypercholesterolemia  She reports good compliance with treatment. She is not having side effects.  Current symptoms include none and have been stable. Weight trend: fluctuating a bit Prior visit with dietician: no Current diet: in general, an "unhealthy" diet Current exercise: none  Wt Readings from Last 3 Encounters:  03/01/18 156 lb 12.8 oz (71.1 kg)  09/24/17 156 lb (70.8 kg)  08/04/17 160 lb (72.6 kg)    -------------------------------------------------------------------  No Known Allergies   Current Outpatient Medications:  .  alendronate (FOSAMAX) 70 MG tablet, Take 1 tablet (70 mg total) by mouth once a week. Take with a full glass of water on an empty stomach., Disp: 12 tablet, Rfl: 3 .  aspirin 81 MG tablet, Take 1 tablet by mouth daily., Disp: , Rfl:  .  Calcium Carbonate (CALCIUM 600 PO), Take 1 tablet by mouth daily., Disp: , Rfl:  .  clonazePAM (KLONOPIN) 1 MG tablet, Take 1 tablet (1 mg total) by mouth at bedtime as needed., Disp: 30 tablet, Rfl: 1 .  dorzolamide-timolol (COSOPT) 22.3-6.8 MG/ML ophthalmic solution, INSTILL 1 DROP INTO BOTH EYES 2 TIMES DAILY, Disp: , Rfl: 4 .  lovastatin (MEVACOR) 40 MG tablet, TAKE 2 TABLETS BY MOUTH EVERY EVENING, Disp: 180 tablet, Rfl: 3 .  mometasone (ELOCON) 0.1 % lotion, Apply topically daily. To ears, as needed (Patient not taking: Reported on 09/24/2019), Disp: 30 mL, Rfl: 1  Review of Systems  Constitutional: Negative for appetite change, chills, fatigue and fever.  Respiratory: Negative for chest tightness and shortness of breath.   Cardiovascular: Negative for chest pain and palpitations.  Gastrointestinal: Negative for abdominal pain, nausea and vomiting.  Neurological: Negative for dizziness and weakness.    Social History   Tobacco Use  . Smoking status: Never Smoker  . Smokeless tobacco: Never Used  Substance Use Topics  . Alcohol use: No    Alcohol/week: 0.0  standard drinks      Objective:   There were no vitals taken for this visit. There were no vitals filed for this visit.There is no height or weight on file to calculate BMI.   Physical Exam  Awake, alert, oriented x 3. In no apparent distress       Assessment & Plan     1. Adjustment insomnia add- traZODone (DESYREL) 100 MG tablet; Take 1 tablet (100 mg total) by mouth at bedtime.  Dispense: 30 tablet; Refill: 1  Advised to work on weaning clonazepam once she starts sleeping betterl   2. Depression, major, single episode, moderate (HCC) start- traZODone (DESYREL) 100 MG tablet; Take 1 tablet (100 mg total) by mouth at bedtime.  Dispense: 30 tablet; Refill: 1  3. Gastroesophageal reflux disease without esophagitis start- famotidine (PEPCID) 20 MG tablet; Take 1 tablet (20 mg total) by mouth 2 (two) times daily.  Dispense: 60 tablet; Refill: 2  4. Seborrheic dermatitis refill- mometasone (ELOCON) 0.1 % lotion; Apply topically daily. To ears, as needed  Dispense: 30 mL; Refill: 1  Follow up phone visit in 3-4 weeks.   She is due for routine labs but is afraid to leave the house. Advised she can come in later in the Spring and encouraged to get Covid vaccine   Follow Up Instructions:    I discussed the assessment and treatment plan with the patient. The patient was provided an opportunity to ask questions and all were answered. The patient agreed with the plan and demonstrated an understanding of the instructions.   The patient was advised to call back or seek an in-person evaluation if the symptoms worsen or if the condition fails to improve as anticipated.  I provided 12 minutes of non-face-to-face time during this encounter.    Lelon Huh, MD  [facility]

## 2019-10-17 ENCOUNTER — Other Ambulatory Visit: Payer: Self-pay | Admitting: Family Medicine

## 2019-10-17 DIAGNOSIS — F321 Major depressive disorder, single episode, moderate: Secondary | ICD-10-CM

## 2019-10-17 DIAGNOSIS — F5102 Adjustment insomnia: Secondary | ICD-10-CM

## 2019-10-17 NOTE — Telephone Encounter (Signed)
Requested medication (s) are due for refill today: Yes  Requested medication (s) are on the active medication list: Yes  Last refill:  09/24/19  Future visit scheduled: Yes  Notes to clinic:  Pharmacy asking for diagnosis code.    Requested Prescriptions  Pending Prescriptions Disp Refills   traZODone (DESYREL) 100 MG tablet [Pharmacy Med Name: TRAZODONE 100 MG TABLET] 90 tablet 1    Sig: TAKE 1 TABLET BY MOUTH EVERYDAY AT BEDTIME      Psychiatry: Antidepressants - Serotonin Modulator Failed - 10/17/2019  2:33 PM      Failed - Completed PHQ-2 or PHQ-9 in the last 360 days.      Failed - Valid encounter within last 6 months    Recent Outpatient Visits           3 weeks ago Adjustment insomnia   [facility] Birdie Sons, MD   1 year ago Pure hypercholesterolemia   [facility] Birdie Sons, MD   2 years ago Cough   [facility] Birdie Sons, MD   2 years ago Strain of lumbar region, initial encounter   [place], [name]   2 years ago Pure hypercholesterolemia   [place], Kirstie Peri, MD       Future Appointments             In 5 days Fisher, Kirstie Peri, MD [facility], [place]

## 2019-10-22 ENCOUNTER — Ambulatory Visit (INDEPENDENT_AMBULATORY_CARE_PROVIDER_SITE_OTHER): Payer: PPO | Admitting: Family Medicine

## 2019-10-22 ENCOUNTER — Encounter: Payer: Self-pay | Admitting: Family Medicine

## 2019-10-22 DIAGNOSIS — F321 Major depressive disorder, single episode, moderate: Secondary | ICD-10-CM | POA: Diagnosis not present

## 2019-10-22 DIAGNOSIS — F5102 Adjustment insomnia: Secondary | ICD-10-CM

## 2019-10-22 MED ORDER — TRAZODONE HCL 100 MG PO TABS: 100.0000 mg | ORAL_TABLET | Freq: Every day | ORAL | Status: DC

## 2019-10-22 NOTE — Patient Instructions (Signed)
.   Please review the attached list of medications and notify my office if there are any errors.   . Please bring all of your medications to every appointment so we can make sure that our medication list is the same as yours.   

## 2019-10-22 NOTE — Progress Notes (Signed)
Virtual Visit via Telephone Note   This visit type was conducted due to national recommendations for restrictions regarding the COVID-19 Pandemic (e.g. social distancing) in an effort to limit this patient's exposure and mitigate transmission in our community.  Due to her co-morbid illnesses, this patient is at least at moderate risk for complications without adequate follow up.  This format is felt to be most appropriate for this patient at this time.  The patient did not have access to video technology or had technical difficulties with video requiring transitioning to audio format only (telephone). No physical exam could be performed with this format other than conversation with the patient.  Location: Patient: home Provider: bfp   Patient: Gabrielle Barnes   DOB: [birth year]   69 y.o. Female  MRN: [id number] Visit Date: 10/22/2019  Today's Provider: Lelon Huh, MD  Subjective:    Chief Complaint  Patient presents with  . Depression  . Insomnia   HPI  Follow up for Depression:  The patient was last seen for this 1 months ago. Changes made at last visit include started Trazodone 100 mg.  She reports good compliance with treatment. She feels that condition is waxing and waning. Patient states she has days when she cries a lot. . She is not having side effects.   ------------------------------------------------------------------------------------  Follow up for Insomnia:  The patient was last seen for this 1 months ago. Changes made at last visit include started Trazodone 100 mg.  She reports good compliance with treatment. She feels that condition is slightly improved.. Sleeps well about 4-5 nights a week, but poorly 2-3 nights a week.  She is not having side effects.   ------------------------------------------------------------------------------------       Medications: Outpatient Medications Prior to Visit  Medication Sig Dispense Refill  . alendronate  (FOSAMAX) 70 MG tablet Take 1 tablet (70 mg total) by mouth once a week. Take with a full glass of water on an empty stomach. 12 tablet 3  . aspirin 81 MG tablet Take 1 tablet by mouth daily.    . Calcium Carbonate (CALCIUM 600 PO) Take 1 tablet by mouth daily.    . clonazePAM (KLONOPIN) 1 MG tablet Take 1 tablet (1 mg total) by mouth at bedtime as needed. 30 tablet 1  . dorzolamide-timolol (COSOPT) 22.3-6.8 MG/ML ophthalmic solution INSTILL 1 DROP INTO BOTH EYES 2 TIMES DAILY  4  . famotidine (PEPCID) 20 MG tablet Take 1 tablet (20 mg total) by mouth 2 (two) times daily. 60 tablet 2  . lovastatin (MEVACOR) 40 MG tablet TAKE 2 TABLETS BY MOUTH EVERY EVENING 180 tablet 3  . mometasone (ELOCON) 0.1 % lotion Apply topically daily. To ears, as needed 30 mL 1  . traZODone (DESYREL) 100 MG tablet TAKE 1 TABLET BY MOUTH EVERYDAY AT BEDTIME 90 tablet 1   No facility-administered medications prior to visit.    Review of Systems  Constitutional: Negative.   Respiratory: Negative.   Cardiovascular: Negative.   Musculoskeletal: Negative.   Psychiatric/Behavioral: Positive for sleep disturbance.       Depression          Objective:    There were no vitals taken for this visit.   Physical Exam   Awake, alert, oriented x 3. In no apparent distress  No results found for any visits on 10/22/19.    Assessment & Plan:    1. Adjustment insomnia Slowly improving, advised may take additional 1/2 tablet as needed -  traZODone (DESYREL) 100 MG tablet; Take 1-1.5 tablets (100-150 mg total) by mouth at bedtime.  2. Depression, major, single episode, moderate (HCC) continue- traZODone (DESYREL) 100 MG tablet; Take 1-1.5 tablets (100-150 mg total) by mouth at bedtime.  Call if symptoms change or if not continued improvement.      I discussed the assessment and treatment plan with the patient. The patient was provided an opportunity to ask questions and all were answered. The patient agreed with  the plan and demonstrated an understanding of the instructions.   The patient was advised to call back or seek an in-person evaluation if the symptoms worsen or if the condition fails to improve as anticipated.  I provided 12 minutes of non-face-to-face time during this encounter.    Lelon Huh, MD  [facility] [phone] (phone) [phone] (fax)  [place]

## 2019-12-17 ENCOUNTER — Other Ambulatory Visit: Payer: Self-pay | Admitting: Family Medicine

## 2019-12-17 DIAGNOSIS — K219 Gastro-esophageal reflux disease without esophagitis: Secondary | ICD-10-CM

## 2019-12-17 NOTE — Telephone Encounter (Signed)
Requested Prescriptions  Pending Prescriptions Disp Refills  . famotidine (PEPCID) 20 MG tablet [Pharmacy Med Name: FAMOTIDINE 20 MG TABLET] 180 tablet     Sig: TAKE 1 TABLET BY MOUTH TWICE A DAY     Gastroenterology:  H2 Antagonists Passed - 12/17/2019  1:23 AM      Passed - Valid encounter within last 12 months    Recent Outpatient Visits          1 month ago Adjustment insomnia   [facility] Birdie Sons, MD   2 months ago Adjustment insomnia   [facility] Birdie Sons, MD   1 year ago Pure hypercholesterolemia   [facility] Birdie Sons, MD   2 years ago Cough   [facility] Birdie Sons, MD   2 years ago Strain of lumbar region, initial encounter   [place], [name]

## 2020-03-26 ENCOUNTER — Other Ambulatory Visit: Payer: Self-pay | Admitting: Family Medicine

## 2020-03-26 DIAGNOSIS — F5102 Adjustment insomnia: Secondary | ICD-10-CM

## 2020-03-26 DIAGNOSIS — F321 Major depressive disorder, single episode, moderate: Secondary | ICD-10-CM

## 2020-03-26 MED ORDER — TRAZODONE HCL 100 MG PO TABS: 100.0000 mg | ORAL_TABLET | Freq: Every day | ORAL | Status: DC

## 2020-03-26 NOTE — Telephone Encounter (Signed)
traZODone (DESYREL) 100 MG tablet     Patient requesting refill.    Pharmacy:  [facility] [place], [place] AT [facility] [address] [facility] Phone:  [phone]  Fax:  [phone]

## 2020-03-27 ENCOUNTER — Other Ambulatory Visit: Payer: Self-pay | Admitting: Family Medicine

## 2020-03-27 DIAGNOSIS — F321 Major depressive disorder, single episode, moderate: Secondary | ICD-10-CM

## 2020-03-27 DIAGNOSIS — F5102 Adjustment insomnia: Secondary | ICD-10-CM

## 2020-04-11 ENCOUNTER — Encounter: Payer: Self-pay | Admitting: Physician Assistant

## 2020-04-11 ENCOUNTER — Other Ambulatory Visit: Payer: Self-pay

## 2020-04-11 ENCOUNTER — Ambulatory Visit
Admission: RE | Admit: 2020-04-11 | Discharge: 2020-04-11 | Disposition: A | Discharge status: Home / Self Care | Payer: PPO | Source: Ambulatory Visit | Attending: Physician Assistant | Admitting: Physician Assistant

## 2020-04-11 ENCOUNTER — Ambulatory Visit (INDEPENDENT_AMBULATORY_CARE_PROVIDER_SITE_OTHER): Payer: PPO | Admitting: Physician Assistant

## 2020-04-11 ENCOUNTER — Ambulatory Visit
Admission: RE | Admit: 2020-04-11 | Discharge: 2020-04-11 | Disposition: A | Discharge status: Home / Self Care | Payer: PPO | Attending: Physician Assistant | Admitting: Physician Assistant

## 2020-04-11 VITALS — BP 145/80 | HR 73 | Temp 98.6°F | Resp 16 | Wt 156.6 lb

## 2020-04-11 DIAGNOSIS — M545 Low back pain, unspecified: Secondary | ICD-10-CM

## 2020-04-11 DIAGNOSIS — M62838 Other muscle spasm: Secondary | ICD-10-CM

## 2020-04-11 MED ORDER — KETOROLAC TROMETHAMINE 60 MG/2ML IM SOLN
60.0000 mg | Freq: Once | INTRAMUSCULAR | Status: AC
  Administered 2020-04-11: 60 mg via INTRAMUSCULAR

## 2020-04-11 MED ORDER — MELOXICAM 7.5 MG PO TABS: 7.5000 mg | ORAL_TABLET | Freq: Every day | ORAL | 0 refills | Status: DC

## 2020-04-11 MED ORDER — BACLOFEN 10 MG PO TABS: 10.0000 mg | ORAL_TABLET | Freq: Three times a day (TID) | ORAL | 0 refills | Status: DC

## 2020-04-11 NOTE — Patient Instructions (Addendum)
May use lidocaine patch (salonpas) OTC  Heating pad ok Menthol topical ok to use; if using lidocaine patch avoid use of menthol cream Meloxicam can be taken once or twice daily (anti-inflammatory) Baclofen (muscle relaxer) can be used up to three times per day. OK to break in half if makes drowsy   Back Exercises These exercises help to make your trunk and back strong. They also help to keep the lower back flexible. Doing these exercises can help to prevent back pain or lessen existing pain.  If you have back pain, try to do these exercises 2-3 times each day or as told by your doctor.  As you get better, do the exercises once each day. Repeat the exercises more often as told by your doctor.  To stop back pain from coming back, do the exercises once each day, or as told by your doctor. Exercises Single knee to chest Do these steps 3-5 times in a row for each leg: 1. Lie on your back on a firm bed or the floor with your legs stretched out. 2. Bring one knee to your chest. 3. Grab your knee or thigh with both hands and hold them it in place. 4. Pull on your knee until you feel a gentle stretch in your lower back or buttocks. 5. Keep doing the stretch for 10-30 seconds. 6. Slowly let go of your leg and straighten it. Pelvic tilt Do these steps 5-10 times in a row: 1. Lie on your back on a firm bed or the floor with your legs stretched out. 2. Bend your knees so they point up to the ceiling. Your feet should be flat on the floor. 3. Tighten your lower belly (abdomen) muscles to press your lower back against the floor. This will make your tailbone point up to the ceiling instead of pointing down to your feet or the floor. 4. Stay in this position for 5-10 seconds while you gently tighten your muscles and breathe evenly. Cat-cow Do these steps until your lower back bends more easily: 1. Get on your hands and knees on a firm surface. Keep your hands under your shoulders, and keep your knees  under your hips. You may put padding under your knees. 2. Let your head hang down toward your chest. Tighten (contract) the muscles in your belly. Point your tailbone toward the floor so your lower back becomes rounded like the back of a cat. 3. Stay in this position for 5 seconds. 4. Slowly lift your head. Let the muscles of your belly relax. Point your tailbone up toward the ceiling so your back forms a sagging arch like the back of a cow. 5. Stay in this position for 5 seconds.  Press-ups Do these steps 5-10 times in a row: 1. Lie on your belly (face-down) on the floor. 2. Place your hands near your head, about shoulder-width apart. 3. While you keep your back relaxed and keep your hips on the floor, slowly straighten your arms to raise the top half of your body and lift your shoulders. Do not use your back muscles. You may change where you place your hands in order to make yourself more comfortable. 4. Stay in this position for 5 seconds. 5. Slowly return to lying flat on the floor.  Bridges Do these steps 10 times in a row: 1. Lie on your back on a firm surface. 2. Bend your knees so they point up to the ceiling. Your feet should be flat on the floor. Your  arms should be flat at your sides, next to your body. 3. Tighten your butt muscles and lift your butt off the floor until your waist is almost as high as your knees. If you do not feel the muscles working in your butt and the back of your thighs, slide your feet 1-2 inches farther away from your butt. 4. Stay in this position for 3-5 seconds. 5. Slowly lower your butt to the floor, and let your butt muscles relax. If this exercise is too easy, try doing it with your arms crossed over your chest. Belly crunches Do these steps 5-10 times in a row: 1. Lie on your back on a firm bed or the floor with your legs stretched out. 2. Bend your knees so they point up to the ceiling. Your feet should be flat on the floor. 3. Cross your arms over  your chest. 4. Tip your chin a little bit toward your chest but do not bend your neck. 5. Tighten your belly muscles and slowly raise your chest just enough to lift your shoulder blades a tiny bit off of the floor. Avoid raising your body higher than that, because it can put too much stress on your low back. 6. Slowly lower your chest and your head to the floor. Back lifts Do these steps 5-10 times in a row: 1. Lie on your belly (face-down) with your arms at your sides, and rest your forehead on the floor. 2. Tighten the muscles in your legs and your butt. 3. Slowly lift your chest off of the floor while you keep your hips on the floor. Keep the back of your head in line with the curve in your back. Look at the floor while you do this. 4. Stay in this position for 3-5 seconds. 5. Slowly lower your chest and your face to the floor. Contact a doctor if:  Your back pain gets a lot worse when you do an exercise.  Your back pain does not get better 2 hours after you exercise. If you have any of these problems, stop doing the exercises. Do not do them again unless your doctor says it is okay. Get help right away if:  You have sudden, very bad back pain. If this happens, stop doing the exercises. Do not do them again unless your doctor says it is okay. This information is not intended to replace advice given to you by your health care provider. Make sure you discuss any questions you have with your health care provider. Document Revised: 04/20/2018 Document Reviewed: 04/20/2018 Elsevier Patient Education  2020 [employer].

## 2020-04-11 NOTE — Progress Notes (Signed)
Established patient visit   Patient: Gabrielle Barnes   DOB: 1951-07-26   69 y.o. Female  MRN: [id number] Visit Date: 04/11/2020  Today's healthcare provider: Mar Daring, PA-C   Chief Complaint  Patient presents with  . Back Pain   Subjective    Back Pain This is a new problem. The current episode started 1 to 4 weeks ago (Last Thursday). The problem occurs constantly. The problem has been gradually worsening since onset. The pain is present in the lumbar spine. The quality of the pain is described as aching and stabbing. The pain does not radiate. The pain is at a severity of 10/10. The pain is severe. The pain is worse during the night. The symptoms are aggravated by sitting, position, standing and lying down. Stiffness is present in the morning. Pertinent negatives include no bladder incontinence, bowel incontinence, leg pain, numbness, tingling or weakness. Risk factors include history of osteoporosis. She has tried analgesics and NSAIDs (Aleve and sore muscle pain gel) for the symptoms. The treatment provided no relief.     Patient Active Problem List   Diagnosis Date Noted  . 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 Microhematuria 04/20/2020  . Osteoporosis 12/01/2016  . Allergic rhinitis 06/17/2015  . Depression 06/17/2015  . Glaucoma 06/17/2015  . 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 Headache 06/17/2015  . HLD (hyperlipidemia) 06/17/2015  . Insomnia 06/17/2015   Past Medical History:  Diagnosis Date  . History of chicken pox   . History of measles   . Migraine        Medications: Outpatient Medications Prior to Visit  Medication Sig  . alendronate (FOSAMAX) 70 MG tablet Take 1 tablet (70 mg total) by mouth once a week. Take with a full glass of water on an empty stomach.  Marland Kitchen aspirin 81 MG tablet Take 1 tablet by mouth daily.  . Calcium Carbonate (CALCIUM 600 PO) Take 1 tablet by mouth daily.  . dorzolamide-timolol (COSOPT) 22.3-6.8 MG/ML ophthalmic solution  INSTILL 1 DROP INTO BOTH EYES 2 TIMES DAILY  . famotidine (PEPCID) 20 MG tablet TAKE 1 TABLET BY MOUTH TWICE A DAY  . lovastatin (MEVACOR) 40 MG tablet TAKE 2 TABLETS BY MOUTH EVERY EVENING  . mometasone (ELOCON) 0.1 % lotion Apply topically daily. To ears, as needed  . traZODone (DESYREL) 100 MG tablet TAKE 1 TABLET BY MOUTH EVERY NIGHT AT BEDTIME  . clonazePAM (KLONOPIN) 1 MG tablet Take 1 tablet (1 mg total) by mouth at bedtime as needed. (Patient not taking: Reported on 04/11/2020)   No facility-administered medications prior to visit.    Review of Systems  Constitutional: Negative.   Respiratory: Negative.   Cardiovascular: Negative.   Gastrointestinal: Negative for bowel incontinence.  Genitourinary: Negative.  Negative for bladder incontinence.  Musculoskeletal: Positive for back pain.  Neurological: Negative for tingling, weakness and numbness.    Last CBC Lab Results  Component Value Date   WBC 8.4 09/10/2013   HGB 16.1 (A) 09/10/2013   HCT 48 (A) 09/10/2013   PLT 295 16/60/6301   Last metabolic panel Lab Results  Component Value Date   GLUCOSE 97 03/01/2018   NA 142 03/01/2018   K 4.4 03/01/2018   CL 103 03/01/2018   CO2 25 03/01/2018   BUN 15 03/01/2018   CREATININE 0.82 03/01/2018   GFRNONAA 74 03/01/2018   GFRAA 86 03/01/2018   CALCIUM 9.3 03/01/2018   PROT 7.3 03/01/2018   ALBUMIN 4.3 03/01/2018   LABGLOB 3.0 03/01/2018  AGRATIO 1.4 03/01/2018   BILITOT 0.3 03/01/2018   ALKPHOS 65 03/01/2018   AST 20 03/01/2018   ALT 20 03/01/2018   Last lipids Lab Results  Component Value Date   CHOL 240 (H) 03/01/2018   HDL 45 03/01/2018   LDLCALC 123 (H) 03/01/2018   TRIG 358 (H) 03/01/2018   CHOLHDL 5.3 (H) 03/01/2018      Objective    BP (!) 145/80 (BP Location: Right Arm, Patient Position: Sitting, Cuff Size: Large)   Pulse 73   Temp 98.6 F (37 C) (Oral)   Resp 16   Wt 156 lb 9.6 oz (71 kg)   BMI 30.58 kg/m  BP Readings from Last 3 Encounters:    04/18/20 (!) 192/95  04/11/20 (!) 145/80  03/01/18 (!) 142/74   Wt Readings from Last 3 Encounters:  04/18/20 156 lb (70.8 kg)  04/11/20 156 lb 9.6 oz (71 kg)  03/01/18 156 lb 12.8 oz (71.1 kg)      Physical Exam Vitals reviewed.  Constitutional:      Appearance: Normal appearance. She is well-developed.  HENT:     Head: Normocephalic and atraumatic.  Pulmonary:     Effort: Pulmonary effort is normal. No respiratory distress.  Musculoskeletal:     Cervical back: Normal range of motion and neck supple.     Thoracic back: Normal.     Lumbar back: Spasms and tenderness present. No bony tenderness. Decreased range of motion. Negative right straight leg raise test and negative left straight leg raise test.  Neurological:     Mental Status: She is alert.  Psychiatric:        Mood and Affect: Mood normal.        Behavior: Behavior normal.        Thought Content: Thought content normal.        Judgment: Judgment normal.    CLINICAL DATA:  Low back pain.  Symptoms beginning 1 week ago.  EXAM: LUMBAR SPINE - COMPLETE 4+ VIEW  COMPARISON:  None.  FINDINGS: Five lumbar type vertebral bodies. Minimal convex left lumbar spine curvature. Sacroiliac joints are symmetric. Aortic atherosclerosis. Maintenance of vertebral body height and alignment. Relatively mild facet arthropathy for age most apparent at L5-S1. Hyperattenuation within both renal collecting systems.  IMPRESSION: Relatively mild lumbar spondylosis, without acute osseous abnormality.  Hyperattenuation within both renal collecting systems. Correlate with recent contrast administration. If no recent contrast administration, findings would be suspicious for bilateral staghorn type calculi.   Electronically Signed   By: Gabrielle Barnes M.D.   On: 04/12/2020 12:41   No results found for any visits on 04/11/20.  Assessment & Plan     1. Acute left-sided low back pain without sciatica Will get xray as  below to r/o bony abnormality. Will start meloxicam as below for anti-inflammatory. Baclofen for prn use for muscle spasm. Toradol shot given today in the office.   *XRAY was essentially unremarkable but did note to have bilateral staghorn renal calculi. Referral to Urology placed* - DG Lumbar Spine Complete; Future - ketorolac (TORADOL) injection 60 mg - meloxicam (MOBIC) 7.5 MG tablet; Take 1 tablet (7.5 mg total) by mouth daily.  Dispense: 30 tablet; Refill: 0  2. Muscle spasm See above medical treatment plan. - baclofen (LIORESAL) 10 MG tablet; Take 1 tablet (10 mg total) by mouth 3 (three) times daily.  Dispense: 30 each; Refill: 0   Return if symptoms worsen or fail to improve.      Artist Pais  Dorothy Puffer, PA-C, have reviewed all documentation for this visit. The documentation on 04/22/20 for the exam, diagnosis, procedures, and orders are all accurate and complete.   [place]  [facility] [phone] (phone) [phone] (fax)  [place]

## 2020-04-15 ENCOUNTER — Encounter: Payer: Self-pay | Admitting: Physician Assistant

## 2020-04-15 ENCOUNTER — Telehealth: Payer: Self-pay

## 2020-04-15 DIAGNOSIS — N200 Calculus of kidney: Secondary | ICD-10-CM

## 2020-04-15 NOTE — Telephone Encounter (Signed)
-----   Message from Mar Daring, [place] sent at 04/15/2020 12:08 PM EDT ----- Back xray is fairly normal and even shows you have only mild arthritic changes for your age. Also it mentions the kidneys appearing more prominent. Have you had any contrast for anything recently? If not it is possible you have bilateral kidney stones. Would recommend referral to urology if no contrast given recently.

## 2020-04-15 NOTE — Telephone Encounter (Signed)
Spoke with patient and daughter Gabrielle Barnes on the speaker and advised as directed below. Per patient and daughter no recent contrast given recently. Don't have a prefer urologist but will call tomorrow or message in my chart if they do have one.

## 2020-04-16 NOTE — Telephone Encounter (Signed)
See my chart message

## 2020-04-18 ENCOUNTER — Ambulatory Visit: Payer: PPO | Admitting: Urology

## 2020-04-18 ENCOUNTER — Other Ambulatory Visit: Payer: Self-pay

## 2020-04-18 ENCOUNTER — Encounter: Payer: Self-pay | Admitting: Urology

## 2020-04-18 VITALS — BP 192/95 | HR 61 | Ht 60.0 in | Wt 156.0 lb

## 2020-04-18 DIAGNOSIS — R3129 Other microscopic hematuria: Secondary | ICD-10-CM

## 2020-04-18 DIAGNOSIS — N200 Calculus of kidney: Secondary | ICD-10-CM

## 2020-04-18 LAB — URINALYSIS, COMPLETE
Bilirubin, UA: NEGATIVE
Glucose, UA: NEGATIVE
Ketones, UA: NEGATIVE
Nitrite, UA: NEGATIVE
Specific Gravity, UA: 1.02 (ref 1.005–1.030)
Urobilinogen, Ur: 0.2 mg/dL (ref 0.2–1.0)
pH, UA: 6.5 (ref 5.0–7.5)

## 2020-04-18 LAB — MICROSCOPIC EXAMINATION: WBC, UA: >30 /hpf — AB (ref 0–5)

## 2020-04-18 NOTE — Progress Notes (Signed)
04/18/2020 10:46 AM   Gabrielle Barnes 1951-07-04 [id number]  Referring provider: Mar Daring, PA-C [place],  [place] [address]  Chief Complaint  Patient presents with  . Nephrolithiasis    HPI: Gabrielle Barnes is a 69 y.o. female seen at the request of Fenton Malling, PA-C for evaluation of nephrolithiasis.   Saw her PCP 04/11/2020 with a 1 week history of right low back pain  She has noted some paresthesias of the right lower leg  Pain is located in the right low back and buttock region  Her pain was felt to be musculoskeletal in etiology and a lumbar spine series was ordered which incidentally showed bilateral staghorn calculi  Denies flank pain, history stone disease or prior history of urologic problems  No bothersome lower urinary tract symptoms or history recurrent UTI   PMH: Past Medical History:  Diagnosis Date  . History of chicken pox   . History of measles   . Migraine     Surgical History: No past surgical history on file.  Home Medications:  Allergies as of 04/18/2020   No Known Allergies     Medication List       Accurate as of April 18, 2020 10:46 AM. If you have any questions, ask your nurse or doctor.        alendronate 70 MG tablet Commonly known as: FOSAMAX Take 1 tablet (70 mg total) by mouth once a week. Take with a full glass of water on an empty stomach.   aspirin 81 MG tablet Take 1 tablet by mouth daily.   baclofen 10 MG tablet Commonly known as: LIORESAL Take 1 tablet (10 mg total) by mouth 3 (three) times daily.   CALCIUM 600 PO Take 1 tablet by mouth daily.   clonazePAM 1 MG tablet Commonly known as: KLONOPIN Take 1 tablet (1 mg total) by mouth at bedtime as needed.   dorzolamide-timolol 22.3-6.8 MG/ML ophthalmic solution Commonly known as: COSOPT INSTILL 1 DROP INTO BOTH EYES 2 TIMES DAILY   famotidine 20 MG tablet Commonly known as: PEPCID TAKE 1 TABLET BY MOUTH TWICE A  DAY   lovastatin 40 MG tablet Commonly known as: MEVACOR TAKE 2 TABLETS BY MOUTH EVERY EVENING   meloxicam 7.5 MG tablet Commonly known as: MOBIC Take 1 tablet (7.5 mg total) by mouth daily.   mometasone 0.1 % lotion Commonly known as: ELOCON Apply topically daily. To ears, as needed   traZODone 100 MG tablet Commonly known as: DESYREL TAKE 1 TABLET BY MOUTH EVERY NIGHT AT BEDTIME       Allergies: No Known Allergies  Family History: Family History  Problem Relation Age of Onset  . Dementia Mother   . Stroke Father   . Hypertension Father   . Cancer Neg Hx   . Diabetes Neg Hx     Social History:  reports that she has never smoked. She has never used smokeless tobacco. She reports that she does not drink alcohol and does not use drugs.   Physical Exam: BP (!) 192/95   Pulse 61   Ht 5' (1.524 m)   Wt 156 lb (70.8 kg)   BMI 30.47 kg/m   Constitutional:  Alert and oriented, No acute distress. HEENT: [place] AT, moist mucus membranes.  Trachea midline, no masses. Cardiovascular: No clubbing, cyanosis, or edema. Respiratory: Normal respiratory effort, no increased work of breathing. Skin: No rashes, bruises or suspicious lesions. Neurologic: Grossly intact, no focal deficits, moving all  4 extremities. Psychiatric: Normal mood and affect.  Laboratory Data:  Urinalysis Dipstick 3+ blood/3+ leukocytes/nitrite negative Microscopy >30 WBC/11-30 RBC/moderate bacteria  Pertinent Imaging: Lumbar spine series reviewed and consistent with complete, bilateral staghorn calculi  Assessment & Plan:    1.  Bilateral staghorn calculi  Discussed x-ray findings in detail  Standard treatment of PCNL was discussed and would require one side at a time with the possibility of multiple staged procedures per side  Schedule CT abdomen pelvis for further delineation of anatomy  2.  Pyuria/microhematuria  Urine culture ordered   Abbie Sons, [place] [address], [place], [place] [address] [phone]

## 2020-04-20 ENCOUNTER — Encounter: Payer: Self-pay | Admitting: Urology

## 2020-04-20 DIAGNOSIS — N200 Calculus of kidney: Secondary | ICD-10-CM | POA: Insufficient documentation

## 2020-04-20 DIAGNOSIS — R3129 Other microscopic hematuria: Secondary | ICD-10-CM | POA: Insufficient documentation

## 2020-04-22 LAB — CULTURE, URINE COMPREHENSIVE

## 2020-04-23 DIAGNOSIS — H40003 Preglaucoma, unspecified, bilateral: Secondary | ICD-10-CM | POA: Diagnosis not present

## 2020-05-02 ENCOUNTER — Other Ambulatory Visit: Payer: Self-pay

## 2020-05-02 ENCOUNTER — Ambulatory Visit
Admission: RE | Admit: 2020-05-02 | Discharge: 2020-05-02 | Disposition: A | Discharge status: Home / Self Care | Payer: PPO | Source: Ambulatory Visit | Attending: Urology | Admitting: Urology

## 2020-05-02 DIAGNOSIS — N200 Calculus of kidney: Secondary | ICD-10-CM | POA: Diagnosis not present

## 2020-05-02 DIAGNOSIS — I700 Atherosclerosis of aorta: Secondary | ICD-10-CM | POA: Diagnosis not present

## 2020-05-02 DIAGNOSIS — Z90710 Acquired absence of both cervix and uterus: Secondary | ICD-10-CM | POA: Diagnosis not present

## 2020-05-02 DIAGNOSIS — K388 Other specified diseases of appendix: Secondary | ICD-10-CM | POA: Diagnosis not present

## 2020-05-02 DIAGNOSIS — R3129 Other microscopic hematuria: Secondary | ICD-10-CM | POA: Insufficient documentation

## 2020-05-02 LAB — POCT I-STAT CREATININE: Creatinine, Ser: 1.2 mg/dL — ABNORMAL HIGH (ref 0.44–1.00)

## 2020-05-02 MED ORDER — IOHEXOL 300 MG/ML  SOLN
100.0000 mL | Freq: Once | INTRAMUSCULAR | Status: AC | PRN
  Administered 2020-05-02: 100 mL via INTRAVENOUS

## 2020-05-04 ENCOUNTER — Other Ambulatory Visit: Payer: Self-pay | Admitting: Physician Assistant

## 2020-05-04 DIAGNOSIS — M545 Low back pain, unspecified: Secondary | ICD-10-CM

## 2020-05-04 NOTE — Telephone Encounter (Signed)
Requested medications are due for refill today?  Yes  Requested medications are on active medication list? Yes  Last Refill:  04/11/2020  # 30 with no refills  Future visit scheduled? No  Notes to Clinic:  Medication failed Rx refill protocol due to no valid Hgb within the past 360 days.  Last Hgb was performed on 09/10/2013.

## 2020-05-05 ENCOUNTER — Telehealth: Payer: Self-pay | Admitting: *Deleted

## 2020-05-05 ENCOUNTER — Other Ambulatory Visit: Payer: Self-pay | Admitting: Urology

## 2020-05-05 DIAGNOSIS — K388 Other specified diseases of appendix: Secondary | ICD-10-CM

## 2020-05-05 NOTE — Telephone Encounter (Signed)
Notified patient as instructed, patient pleased. Discussed follow-up appointments, patient agrees  

## 2020-05-05 NOTE — Telephone Encounter (Signed)
-----   Message from Nori Riis, PA-C sent at 05/05/2020  8:46 AM EDT ----- I have spoken to Mrs. 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  I will be referring her to a general surgeon.  She also needs an appointment with Dr. Erlene Quan to discuss treatment of her staghorn stones.

## 2020-05-05 NOTE — Progress Notes (Unsigned)
I have placed a referral for her to general surgery for the mucocele of the appendix.  She is coming in to sign a DPR, so that we can discuss her medical information with her daughters.  She will also need an appointment to discuss treatment for her staghorns with Dr. Erlene Quan.

## 2020-05-08 ENCOUNTER — Ambulatory Visit (INDEPENDENT_AMBULATORY_CARE_PROVIDER_SITE_OTHER): Payer: PPO | Admitting: Urology

## 2020-05-08 ENCOUNTER — Encounter: Payer: Self-pay | Admitting: Urology

## 2020-05-08 ENCOUNTER — Other Ambulatory Visit: Payer: Self-pay

## 2020-05-08 VITALS — BP 176/87 | HR 69 | Ht 60.0 in | Wt 156.0 lb

## 2020-05-08 DIAGNOSIS — K388 Other specified diseases of appendix: Secondary | ICD-10-CM

## 2020-05-08 DIAGNOSIS — N200 Calculus of kidney: Secondary | ICD-10-CM | POA: Diagnosis not present

## 2020-05-08 LAB — URINALYSIS, COMPLETE
Bilirubin, UA: NEGATIVE
Glucose, UA: NEGATIVE
Ketones, UA: NEGATIVE
Nitrite, UA: POSITIVE — AB
Specific Gravity, UA: 1.02 (ref 1.005–1.030)
Urobilinogen, Ur: 0.2 mg/dL (ref 0.2–1.0)
pH, UA: 7.5 (ref 5.0–7.5)

## 2020-05-08 LAB — MICROSCOPIC EXAMINATION
RBC, Urine: >30 /hpf — AB (ref 0–2)
WBC, UA: >30 /hpf — AB (ref 0–5)

## 2020-05-08 NOTE — Patient Instructions (Signed)
Percutaneous Nephrolithotomy Percutaneous nephrolithotomy is a procedure to remove kidney stones. Kidney stones are deposits that form inside your kidneys and can cause pain. You may need this procedure if:  You have large kidney stones. Kidney stones that are bigger than 2 cm (0.78 in.) wide may require this procedure.  Your kidney stones are oddly shaped.  Other treatments have not been successful in helping the kidney stones to pass.  You have developed an infection due to the kidney stones. Tell a health care provider about:  Any allergies you have.  All medicines you are taking, including vitamins, herbs, eye drops, creams, and over-the-counter medicines.  Any problems you or family members have had with anesthetic medicines.  Any blood disorders you have.  Any surgeries you have had.  Any medical conditions you have.  Whether you are pregnant or may be pregnant.  Whether you use any tobacco products, including cigarettes, chewing tobacco, or e-cigarettes. What are the risks? Generally, this is a safe procedure. However, problems may occur, including:  Infection.  Bleeding. This may include blood in your urine.  Allergic reactions to medicines.  Damage to other structures or organs.  Kidney damage.  Holes in the kidney. These often heal on their own.  Numbness or tingling in the affected area.  Inability to remove all the stones. You may need a different procedure to complete treatment. What happens before the procedure? Staying hydrated Follow instructions from your health care provider about hydration, which may include:  Up to 2 hours before the procedure - you may continue to drink clear liquids, such as water, clear fruit juice, black coffee, and plain tea.  Eating and drinking restrictions Follow instructions from your health care provider about eating and drinking, which may include:  8 hours before the procedure - stop eating heavy meals or foods,  such as meat, fried foods, or fatty foods.  6 hours before the procedure - stop eating light meals or foods, such as toast or cereal.  6 hours before the procedure - stop drinking milk or drinks that contain milk.  2 hours before the procedure - stop drinking clear liquids. Medicines Ask your health care provider about:  Changing or stopping your regular medicines. This is especially important if you are taking diabetes medicines or blood thinners.  Taking medicines such as aspirin and ibuprofen. These medicines can thin your blood. Do not take these medicines unless your health care provider tells you to take them.  Taking over-the-counter medicines, vitamins, herbs, and supplements. Tests You may have tests, including:  Blood tests.  Urine tests.  Tests to check how your heart is working.  Imaging studies. These are used to identify: ? The size and number (stone burden) of the kidney stones. ? The position of the kidney stones. General instructions  Plan to have someone take you home from the hospital or clinic.  Plan to have a responsible adult care for you for at least 24 hours after you leave the hospital or clinic. This is important.  Ask your health care provider how your surgical site will be marked or identified.  Ask your health care provider what steps will be taken to help prevent infection. These may include: ? Removing hair at the surgery site. ? Washing skin with a germ-killing soap. ? Taking antibiotic medicine. What happens during the procedure?   An IV will be inserted into one of your veins.  The site of the procedure will be marked.  You will be   given one or more of the following: ? A medicine to help you relax (sedative). ? A medicine to numb the area (local anesthetic). ? A medicine to make you fall asleep (general anesthetic). ? A medicine that is injected into your spine to numb the area below and slightly above the injection site (spinal  anesthetic). ? A medicine that is injected into an area of your body to numb everything below the injection site (regional anesthetic).  A thin tube (urinary catheter) will be put in your bladder to drain urine during and after the procedure.  Your surgeon will make a small cut (incision) in your lower back.  A tube will be inserted through the incision into your kidney.  Each kidney stone will be removed through this tube. Larger stones may need to be broken up with a high-intensity light beam (laser) or other tools.  After all of the stones have been removed, your health care provider may put in tubes to drain your bladder. Based on your condition: ? An internal tube, called a stent, may be put in your ureter. This will help drain urine from your kidney to your bladder. ? A surgical drain (nephrostomy tube) may be put in your kidney. The tube comes out through the incision in your lower back. This will help to drain urine or any fluid that builds up while your kidney heals.  Part of the incision may be closed with stitches (sutures).  A bandage (dressing) will be placed over the incision area. The procedure may vary among health care providers and hospitals. What happens after the procedure?  Your blood pressure, heart rate, breathing rate, and blood oxygen level will be monitored until you leave the hospital or clinic.  You may be given medicine for pain.  You will be shown how to do breathing exercises, such as coughing and breathing deeply. These will help to prevent pneumonia.  You will be encouraged to walk. Walking helps to prevent blood clots.  Your stent and urinary catheter will be removed after 1-2 days if there is only a small amount of blood in your urine.  You will be taught how to care for the catheter or nephrostomy tube, if you have them.  Do not drive for 24 hours if you were given a sedative during your procedure. Summary  Percutaneous nephrolithotomy is a  procedure to remove kidney stones.  Ask your health care provider about changing or stopping your regular medicines.  Before surgery, follow instructions from your health care provider about eating and drinking.  Plan to have someone take you home from the hospital or clinic. This information is not intended to replace advice given to you by your health care provider. Make sure you discuss any questions you have with your health care provider. Document Revised: 09/21/2018 Document Reviewed: 02/15/2018 Elsevier Patient Education  2020 [employer]  

## 2020-05-08 NOTE — Progress Notes (Signed)
05/08/2020 4:36 PM   SAMANVI CUCCIA 1951/05/05 [id number]  Referring provider: Birdie Sons, [address] [place],  [place] [address]  Chief Complaint  Patient presents with  . Other    HPI: 69 year old female who presents today for further discussion of management of bilateral full staghorn renal calculi.  She was initially seen and evaluated for flank pain and gross hematuria. She underwent CT urogram which showed full bilateral staghorn renal calculi without evidence of urinary obstruction.  Notably, she is had several UTIs primarily of which is Proteus.  She is not currently having any flank pain and is otherwise asymptomatic.  No personal history of kidney stones or renal stone surgery.  No significant urinary symptoms today including no dysuria or ongoing gross hematuria. No recent flank pain episodes.  She does take 81 mg of aspirin daily for prevention. She is otherwise reasonably healthy.  She has also have incidental abdominal appendiceal mucocele for which she is scheduled to be seen and evaluated by Dr. Christian Mate next week.   PMH: Past Medical History:  Diagnosis Date  . History of chicken pox   . History of measles   . Migraine     Surgical History: No past surgical history on file.  Home Medications:  Allergies as of 05/08/2020   No Known Allergies     Medication List       Accurate as of May 08, 2020  4:36 PM. If you have any questions, ask your nurse or doctor.        alendronate 70 MG tablet Commonly known as: FOSAMAX Take 1 tablet (70 mg total) by mouth once a week. Take with a full glass of water on an empty stomach.   aspirin 81 MG tablet Take 1 tablet by mouth daily.   baclofen 10 MG tablet Commonly known as: LIORESAL Take 1 tablet (10 mg total) by mouth 3 (three) times daily.   CALCIUM 600 PO Take 1 tablet by mouth daily.   clonazePAM 1 MG tablet Commonly known as: KLONOPIN Take 1 tablet (1 mg  total) by mouth at bedtime as needed.   dorzolamide-timolol 22.3-6.8 MG/ML ophthalmic solution Commonly known as: COSOPT INSTILL 1 DROP INTO BOTH EYES 2 TIMES DAILY   famotidine 20 MG tablet Commonly known as: PEPCID TAKE 1 TABLET BY MOUTH TWICE A DAY   lovastatin 40 MG tablet Commonly known as: MEVACOR TAKE 2 TABLETS BY MOUTH EVERY EVENING   meloxicam 7.5 MG tablet Commonly known as: MOBIC TAKE 1 TABLET BY MOUTH EVERY DAY   mometasone 0.1 % lotion Commonly known as: ELOCON Apply topically daily. To ears, as needed   traZODone 100 MG tablet Commonly known as: DESYREL TAKE 1 TABLET BY MOUTH EVERY NIGHT AT BEDTIME       Allergies: No Known Allergies  Family History: Family History  Problem Relation Age of Onset  . Dementia Mother   . Stroke Father   . Hypertension Father   . Cancer Neg Hx   . Diabetes Neg Hx     Social History:  reports that she has never smoked. She has never used smokeless tobacco. She reports that she does not drink alcohol and does not use drugs.   Physical Exam: BP (!) 176/87   Pulse 69   Ht 5' (1.524 m)   Wt 156 lb (70.8 kg)   BMI 30.47 kg/m   Constitutional:  Alert and oriented, No acute distress. HEENT: [place] AT, moist mucus membranes.  Trachea  midline, no masses. Cardiovascular: No clubbing, cyanosis, or edema. Respiratory: Normal respiratory effort, no increased work of breathing. Skin: No rashes, bruises or suspicious lesions. Neurologic: Grossly intact, no focal deficits, moving all 4 extremities. Psychiatric: Normal mood and affect.  Laboratory Data: Lab Results  Component Value Date   WBC 8.4 09/10/2013   HGB 16.1 (A) 09/10/2013   HCT 48 (A) 09/10/2013   PLT 295 09/10/2013    Lab Results  Component Value Date   CREATININE 1.20 (H) 05/02/2020    Pertinent Imaging:  CT HEMATURIA WORKUP  Narrative CLINICAL DATA:  Bilateral renal staghorn calculi.  EXAM: CT ABDOMEN AND PELVIS WITHOUT AND WITH  CONTRAST  TECHNIQUE: Multidetector CT imaging of the abdomen and pelvis was performed following the standard protocol before and following the bolus administration of intravenous contrast.  CONTRAST:  196mL OMNIPAQUE IOHEXOL 300 MG/ML  SOLN  COMPARISON:  None.  FINDINGS: Lower Chest: No acute findings.  Hepatobiliary: No hepatic masses identified. Gallbladder is unremarkable. No evidence of biliary ductal dilatation.  Pancreas:  No mass or inflammatory changes.  Spleen: Within normal limits in size and appearance.  Adrenals/Urinary Tract: Staghorn calculi are seen throughout the renal collecting systems bilaterally. No evidence of ureteral calculi or hydronephrosis. No masses seen involving the kidneys, ureters, or bladder.  Stomach/Bowel: A large mucocele of the appendix is seen which measures 11.4 x 4.7 cm. No associated soft tissue masses visualized by CT. No evidence of bowel obstruction, inflammatory process or abnormal fluid collections.  Vascular/Lymphatic: No pathologically enlarged lymph nodes. No abdominal aortic aneurysm. Aortic atherosclerosis noted.  Reproductive: Prior hysterectomy noted. Adnexal regions are unremarkable in appearance.  Other:  None.  Musculoskeletal:  No suspicious bone lesions identified.  46  IMPRESSION: Bilateral staghorn calculi throughout the renal collecting systems. No evidence of ureteral calculi or hydronephrosis.  No radiographic evidence of urinary tract neoplasm.  Large mucocele of the appendix. Surgical consultation is recommended.  Aortic Atherosclerosis (ICD10-I70.0).   Electronically Signed By: Marlaine Hind M.D. On: 05/04/2020 10:29  CT scan was personally reviewed today. Agree with radiologic interpretation.   Assessment & Plan:    1. Staghorn calculus Pathophysiology as well as natural history of bilateral staghorn calculi were reviewed today. Pictures were drawn. At this point time she has no urinary  obstruction. Suspect these are likely struvite stone in the setting of Proteus.  We discussed the option of PCNL at length today. We will work on 1 renal unit at a time, arbitrarily starting on the left. She understands since the staghorn is in fact full, this may require multiple staged procedures versus dual access. We discussed the procedure at length including interventional radiology role in obtaining renal axis, preoperative intraoperative and postoperative course. We discussed the risk of the procedure including risk of needing multiple procedures, bleeding, and risk of blood transfusion, risk of damage surrounding structures, damage to the kidney, infection amongst others. All questions were answered.  She like to pursue left PCNL's. We discussed that currently Covid inpatient since this is limiting her ability to perform elective procedures which are nonemergent require overnight admission. As such, this will likely need to wait until later in October early November.  She also mentions today that she has a appendix mucocele for which she may need surgical resection. I will touch base with Dr. Christian Mate after her evaluation to discuss the urgency and timing of each of these procedures in order to most efficiently optimize her care. She is agreeable this plan. All questions  were answered today.  - Urinalysis, Complete  2. Mucocele of appendix Will discuss with Dr. Christian Mate regarding the timing and order of intervention.   Hollice Espy, MD  [facility] [address] [place], [place] [address] [phone]

## 2020-05-13 ENCOUNTER — Ambulatory Visit (INDEPENDENT_AMBULATORY_CARE_PROVIDER_SITE_OTHER): Payer: PPO | Admitting: Surgery

## 2020-05-13 ENCOUNTER — Encounter: Payer: Self-pay | Admitting: Surgery

## 2020-05-13 ENCOUNTER — Other Ambulatory Visit: Payer: Self-pay

## 2020-05-13 VITALS — BP 194/113 | HR 60 | Temp 98.2°F | Resp 12 | Ht 60.0 in | Wt 154.0 lb

## 2020-05-13 DIAGNOSIS — K388 Other specified diseases of appendix: Secondary | ICD-10-CM

## 2020-05-13 NOTE — Patient Instructions (Signed)
Our surgery scheduler will contact you to schedule your surgery. Please have the blue sheet available when she calls you.   Call the office if you have any questions or concerns.  Laparoscopic Appendectomy, Adult  A laparoscopic appendectomy is a surgery to take out the appendix. The appendix is a finger-like structure that is attached to the large intestine. In this surgery, the appendix is removed through three small incisions with the help of a thin, lighted tube that has a camera (laparoscope). This procedure may be done to prevent an inflamed appendix from bursting (rupturing). It may also be done to treat the infection from an appendix that has already ruptured. It is usually done right after inflammation of the appendix (appendicitis) is diagnosed. This is a minimally invasive surgery. It usually results in less pain, fewer problems, and a quicker recovery than surgery done through a large incision. Tell a health care provider about:  Any allergies you have.  All medicines you are taking, including vitamins, herbs, eye drops, creams, and over-the-counter medicines.  Use of steroids (by mouth or creams).  Any problems you or family members have had with anesthetic medicines.  Any blood disorders you have.  Any surgeries you have had.  Any medical conditions you have.  Whether you are pregnant or may be pregnant. What are the risks? Generally, this is a safe procedure. However, problems may occur, including:  Infection.  Bleeding.  Allergic reactions to medicines.  Damage to other structures or organs.  The formation of pus (abscesses).  Long-lasting pain or scarring at the incision sites or inside the abdomen.  Blood clots in the legs. What happens before the procedure? Eating and drinking restrictions Follow instructions from your health care provider about eating and drinking restrictions. You may be asked not to eat or drink as soon as the diagnosis of appendicitis  is made. Medicines Ask your health care provider about:  Changing or stopping your regular medicines. This is especially important if you are taking diabetes medicines or blood thinners.  Taking medicines such as aspirin and ibuprofen. These medicines can thin your blood. Do not take these medicines unless your health care provider tells you to take them.  Taking over-the-counter medicines, vitamins, herbs, and supplements. General instructions  Plan to have someone take you home from the hospital.  If you will be going home right after the procedure, plan to have someone with you for 24 hours.  You may be given antibiotic medicine to help prevent infection or to treat existing inflammation or infection.  Ask your health care provider how your surgical site will be marked or identified.  Ask your health care provider what steps will be taken to help prevent infection. These may include: ? Removing hair at the surgery site. ? Washing skin with a germ-killing soap. ? Taking antibiotic medicine. What happens during the procedure?   An IV will be inserted into one of your veins.  You will be given one or more of the following: ? A medicine to help you relax (sedative). ? A medicine to numb the area (local anesthetic). ? A medicine to make you fall asleep (general anesthetic).  A thin, flexible tube (catheter) may be put into your bladder to drain urine.  A tube may be passed through your nose and into your stomach (NG tube, or nasogastric tube) to drain any stomach contents.  Your surgeon will make three small incisions near your belly button (navel).  Air-like gas will be used to  fill your abdomen. The gas will make your abdomen expand. This helps the surgeon see clearly and gives him or her more room to work.  A laparoscope will be passed through one of the incisions.  Other long, thin surgical instruments will be passed through the other incisions.  The appendix will be  located and removed through one of the incisions.  The abdomen may be washed out to remove bacteria.  The incisions will be closed with stitches (sutures), staples, or adhesive strips.  A bandage (dressing) may be used to cover the incisions.  If a tube was inserted into your bladder or stomach, it will be removed. The procedure may vary among health care providers and hospitals. What happens after the procedure?  Your blood pressure, heart rate, breathing rate, and blood oxygen level will be monitored until you leave the hospital.  You will be given medicines as needed to control pain.  Do not drive for 24 hours if you were given a sedative during your procedure.  If your appendix did not rupture, you may be able to go home the same day after your surgery.  If your appendix ruptured: ? You will get antibiotic medicine through an IV line. ? You may be sent home with a temporary drain. Summary  A laparoscopic appendectomy is a surgery to take out the appendix. The appendix is removed through three small incisions with the help of a thin, lighted tube that has a camera.  This is a safe procedure, but there are some risks, including bleeding, infection, allergic reaction to medicines, or damage to other organs.  You may be asked not to eat or drink as soon as a diagnosis of appendicitis is made.  After the procedure, your blood pressure, heart rate, breathing rate, and blood oxygen level will be monitored until you leave the hospital. This information is not intended to replace advice given to you by your health care provider. Make sure you discuss any questions you have with your health care provider. Document Revised: 01/26/2018 Document Reviewed: 01/26/2018 Elsevier Patient Education  2020 [employer].

## 2020-05-13 NOTE — Progress Notes (Signed)
Patient ID: Gabrielle Barnes, female   DOB: March 15, 1951, 69 y.o.   MRN: [id number]  Chief Complaint: Mucocele of the appendix  History of Present Illness Gabrielle Barnes is a 69 y.o. 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  Initially had work-up for acute onset of lumbar back pain, CT urogram confirmed bilateral staghorn calculi.  The CT also identified the appendiceal mass.  Past Medical History Past Medical History:  Diagnosis Date   History of chicken pox    History of measles    Migraine       Past Surgical History:  Procedure Laterality Date   ABDOMINAL HYSTERECTOMY     CESAREAN SECTION      No Known Allergies  Current Outpatient Medications  Medication Sig Dispense Refill   alendronate (FOSAMAX) 70 MG tablet Take 1 tablet (70 mg total) by mouth once a week. Take with a full glass of water on an empty stomach. 12 tablet 3   aspirin 81 MG tablet Take 1 tablet by mouth daily.     Calcium Carbonate (CALCIUM 600 PO) Take 1 tablet by mouth daily.     dorzolamide-timolol (COSOPT) 22.3-6.8 MG/ML ophthalmic solution INSTILL 1 DROP INTO BOTH EYES 2 TIMES DAILY  4   famotidine (PEPCID) 20 MG tablet TAKE 1 TABLET BY MOUTH TWICE A DAY 180 tablet 0   lovastatin (MEVACOR) 40 MG tablet TAKE 2 TABLETS BY MOUTH EVERY EVENING 180 tablet 3   mometasone (ELOCON) 0.1 % lotion Apply topically daily. To ears, as needed 30 mL 1   traZODone (DESYREL) 100 MG tablet TAKE 1 TABLET BY MOUTH EVERY NIGHT AT BEDTIME 90 tablet 0   baclofen (LIORESAL) 10 MG tablet Take 1 tablet (10 mg total) by mouth 3 (three) times daily. (Patient not taking: Reported on 05/13/2020) 30 each 0   clonazePAM (KLONOPIN) 1 MG tablet Take 1 tablet (1 mg total) by mouth at bedtime as needed. (Patient not taking: Reported on 05/13/2020) 30 tablet 1   meloxicam (MOBIC) 7.5 MG tablet TAKE 1 TABLET BY MOUTH EVERY DAY (Patient not taking: Reported on 05/13/2020) 30 tablet 2   No current  facility-administered medications for this visit.    Family History Family History  Problem Relation Age of Onset   Dementia Mother    Stroke Father    Hypertension Father    Cancer Neg Hx    Diabetes Neg Hx       Social History Social History   Tobacco Use   Smoking status: Never Smoker   Smokeless tobacco: Never Used  Substance Use Topics   Alcohol use: No    Alcohol/week: 0.0 standard drinks   Drug use: No        Review of Systems  Constitutional: Negative.   HENT: Negative.   Eyes: Negative.   Respiratory: Negative.   Cardiovascular: Negative.   Gastrointestinal: Negative.   Genitourinary: Negative for dysuria, flank pain and hematuria.  Skin: Negative.   Neurological: Negative.   Psychiatric/Behavioral: Negative.       Physical Exam Blood pressure (!) 194/113, pulse 60, temperature 98.2 F (36.8 C), resp. rate 12, height 5' (1.524 m), weight 154 lb (69.9 kg), SpO2 94 %. Last Weight  Most recent update: 05/13/2020  1:27 PM    Weight  69.9 kg (154 lb)             CONSTITUTIONAL: Well developed, and nourished, appropriately responsive and aware without distress.   EYES: Sclera  non-icteric.   EARS, NOSE, MOUTH AND THROAT: Mask worn.  Hearing is intact to voice.  NECK: Trachea is midline, and there is no jugular venous distension.  LYMPH NODES:  Lymph nodes in the neck are not enlarged. RESPIRATORY:  Lungs are clear, and breath sounds are equal bilaterally. Normal respiratory effort without pathologic use of accessory muscles. CARDIOVASCULAR: Heart is regular in rate and rhythm. GI: The abdomen is soft, nontender, and nondistended. There were no palpable masses. I did not appreciate hepatosplenomegaly. There were normal bowel sounds. MUSCULOSKELETAL:  Symmetrical muscle tone appreciated in all four extremities.    SKIN: Skin turgor is normal. No pathologic skin lesions appreciated.  NEUROLOGIC:  Motor and sensation appear grossly normal.  Cranial nerves  are grossly without defect. PSYCH:  Alert and oriented to person, place and time. Affect is appropriate for situation.  Data Reviewed I have personally reviewed what is currently available of the patient's imaging, recent labs and medical records.   Labs:   CMP Latest Ref Rng & Units 05/02/2020 03/01/2018 11/22/2016  Glucose 65 - 99 mg/dL - 97 114(H)  BUN 8 - 27 mg/dL - 15 14  Creatinine 0.44 - 1.00 mg/dL 1.20(H) 0.82 0.89  Sodium 134 - 144 mmol/L - 142 146(H)  Potassium 3.5 - 5.2 mmol/L - 4.4 4.7  Chloride 96 - 106 mmol/L - 103 102  CO2 20 - 29 mmol/L - 25 24  Calcium 8.7 - 10.3 mg/dL - 9.3 9.6  Total Protein 6.0 - 8.5 g/dL - 7.3 7.7  Total Bilirubin 0.0 - 1.2 mg/dL - 0.3 0.3  Alkaline Phos 39 - 117 IU/L - 65 76  AST 0 - 40 IU/L - 20 30  ALT 0 - 32 IU/L - 20 20     Imaging: Radiology review:     CLINICAL DATA:  Bilateral renal staghorn calculi.   EXAM: CT ABDOMEN AND PELVIS WITHOUT AND WITH CONTRAST   TECHNIQUE: Multidetector CT imaging of the abdomen and pelvis was performed following the standard protocol before and following the bolus administration of intravenous contrast.   CONTRAST:  148mL OMNIPAQUE IOHEXOL 300 MG/ML  SOLN   COMPARISON:  None.   FINDINGS: Lower Chest: No acute findings.   Hepatobiliary: No hepatic masses identified. Gallbladder is unremarkable. No evidence of biliary ductal dilatation.   Pancreas:  No mass or inflammatory changes.   Spleen: Within normal limits in size and appearance.   Adrenals/Urinary Tract: Staghorn calculi are seen throughout the renal collecting systems bilaterally. No evidence of ureteral calculi or hydronephrosis. No masses seen involving the kidneys, ureters, or bladder.   Stomach/Bowel: A large mucocele of the appendix is seen which measures 11.4 x 4.7 cm. No associated soft tissue masses visualized by CT. No evidence of bowel obstruction, inflammatory process or abnormal fluid collections.     Vascular/Lymphatic: No pathologically enlarged lymph nodes. No abdominal aortic aneurysm. Aortic atherosclerosis noted.   Reproductive: Prior hysterectomy noted. Adnexal regions are unremarkable in appearance.   Other:  None.   Musculoskeletal:  No suspicious bone lesions identified.   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 No evidence of ureteral calculi or hydronephrosis.   No radiographic evidence of urinary tract neoplasm.   Large mucocele of the appendix. Surgical consultation is recommended.   Aortic Atherosclerosis (ICD10-I70.0).     Electronically Signed   By: Marlaine Hind M.D.   On: 05/04/2020 10:29   Within last 24 hrs: No results found.  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  Risks of laparoscopic surgery discussed with the patient and her daughter which include but not limited to anesthesia, bleeding, infection, the need for a partial colectomy, cystic rupture, recurrence etc.  I believe she understands and desires to proceed.  Believe her questions were adequately answered without any guarantees ever expressed or implied.  Face-to-face time spent with the patient and accompanying care providers(if present) was 45 minutes, with more than 50% of the time spent counseling, educating, and coordinating care of the patient.      Ronny Bacon M.D., FACS 05/13/2020, 2:27 PM

## 2020-05-15 ENCOUNTER — Telehealth: Payer: Self-pay

## 2020-05-15 NOTE — Telephone Encounter (Signed)
Patient's daughter Magda Paganini called wanting to know why there has been a change in plans as far as patient having appendectomy first now vs what was discussed at appointment that stone should be done first? She is unclear as to why this has changed. Please advise

## 2020-05-15 NOTE — Telephone Encounter (Signed)
Discussed with Dr. Christian Mate as planned (this was what was discussed during our clinic visit).  He feels that this is the best sequence of procedures.    Given the patient positioning and other factors, the cannot be done at the same time.  We will plan on tackling the abdominal surgery first and then address the kidneys in a staged fashion.  Hollice Espy, MD

## 2020-05-19 ENCOUNTER — Telehealth: Payer: Self-pay | Admitting: Surgery

## 2020-05-19 NOTE — Telephone Encounter (Signed)
Spoke with daughter, Lolita Patella.  They have been informed of Pre-Admission date/time, COVID Testing date and Surgery date.  Surgery Date: 06/13/20 Preadmission Testing Date: 06/02/20 (phone 1p-5p) Covid Testing Date: 06/11/20 - patient advised to go to the [facility] ([place]) between 8a-1p   Patient has been made aware to call [phone], between 1-3:00pm the day before surgery, to find out what time to arrive for surgery.

## 2020-05-19 NOTE — Telephone Encounter (Signed)
Dr Erlene Quan, We have the go ahead to schedule observation cases in the OR. How long should we wait to schedule a PCNL after the surgery with Dr Christian Mate which is scheduled on 06/13/2020? Does she need an appointment with you prior to scheduling surgery?

## 2020-05-21 ENCOUNTER — Inpatient Hospital Stay: Admit: 2020-05-21 | Payer: PPO | Admitting: Surgery

## 2020-05-21 ENCOUNTER — Other Ambulatory Visit: Payer: Self-pay | Admitting: Radiology

## 2020-05-21 SURGERY — APPENDECTOMY, ROBOT-ASSISTED, LAPAROSCOPIC
Anesthesia: General | Laterality: Right

## 2020-05-21 NOTE — Telephone Encounter (Signed)
Please have her follow-up with me in late November and tentatively schedule her for early to mid December.  Hollice Espy, MD

## 2020-06-02 ENCOUNTER — Encounter
Admission: RE | Admit: 2020-06-02 | Discharge: 2020-06-02 | Disposition: A | Discharge status: Home / Self Care | Payer: PPO | Source: Ambulatory Visit | Attending: Surgery | Admitting: Surgery

## 2020-06-02 ENCOUNTER — Other Ambulatory Visit: Payer: Self-pay

## 2020-06-02 DIAGNOSIS — Z01818 Encounter for other preprocedural examination: Secondary | ICD-10-CM | POA: Diagnosis not present

## 2020-06-02 HISTORY — DX: Personal history of urinary calculi: Z87.442

## 2020-06-02 HISTORY — DX: Gastro-esophageal reflux disease without esophagitis: K21.9

## 2020-06-02 NOTE — Patient Instructions (Addendum)
Your procedure is scheduled on: Friday 11/5 Report to Day Surgery. To find out your arrival time please call [phone] between 1PM - 3PM on Thurs 11/4 .  Remember: Instructions that are not followed completely may result in serious medical risk,  up to and including death, or upon the discretion of your surgeon and anesthesiologist your  surgery may need to be rescheduled.     _X__ 1. Do not eat food after midnight the night before your procedure.                 No chewing gum or hard candies. You may drink clear liquids up to 2 hours                 before you are scheduled to arrive for your surgery- DO not drink clear                 liquids within 2 hours of the start of your surgery.                 Clear Liquids include:  water, apple juice without pulp, clear Gatorade, G2 or                  Gatorade Zero (avoid Red/Purple/Blue), Black Coffee or Tea (Do not add                 anything to coffee or tea). _____2.   Complete the "Ensure Clear Pre-surgery Clear Carbohydrate Drink" provided to you, 2 hours before arrival. **If you       are diabetic you will be provided with an alternative drink, Gatorade Zero or G2.  __X__2.  On the morning of surgery brush your teeth with toothpaste and water, you                may rinse your mouth with mouthwash if you wish.  Do not swallow any toothpaste of mouthwash.     ___ 3.  No Alcohol for 24 hours before or after surgery.   ___ 4.  Do Not Smoke or use e-cigarettes For 24 Hours Prior to Your Surgery.                 Do not use any chewable tobacco products for at least 6 hours prior to                 Surgery.  ___  5.  Do not use any recreational drugs (marijuana, cocaine, heroin, ecstasy, MDMA or other)                For at least one week prior to your surgery.  Combination of these drugs with anesthesia                May have life threatening results.  ____  6.  Bring all medications with you on the day of  surgery if instructed.   _x___  7.  Notify your doctor if there is any change in your medical condition      (cold, fever, infections).     Do not wear jewelry, make-up, hairpins, clips or nail polish. Do not wear lotions, powders, or perfumes. You may wear deodorant. Do not shave 48 hours prior to surgery.  Do not bring valuables to the hospital.    [facility] is not responsible for any belongings or valuables.  Contacts, dentures or bridgework may not be worn into surgery. Leave your suitcase in the car.  After surgery it may be brought to your room. For patients admitted to the hospital, discharge time is determined by your treatment team.   Patients discharged the day of surgery will not be allowed to drive home.   Make arrangements for someone to be with you for the first 24 hours of your Same Day Discharge.    Please read over the following fact sheets that you were given:    __x__ Take these medicines the morning of surgery with A SIP OF WATER:    1. dorzolamide-timolol (COSOPT) 22.3-6.8 MG/ML ophthalmic solution  2. famotidine (PEPCID) 20 MG tablet the night before and the morning of surgery  3.   4.  5.  6.  ____ Fleet Enema (as directed)   _x___ Use CHG Soap (or wipes) as directed  ____ Use Benzoyl Peroxide Gel as instructed  ____ Use inhalers on the day of surgery  ____ Stop metformin 2 days prior to surgery    ____ Take 1/2 of usual insulin dose the night before surgery. No insulin the morning          of surgery.   __x__ Stopped aspirin already  __x__ Stop Anti-inflammatories   no ibuprofen aleve or aspirin products  After 10/29   May take tylenol    ____ Stop supplements until after surgery.    ____ Bring C-Pap to the hospital.    If you have any questions regarding your pre-procedure instructions,  Please call Pre-admit Testing at [place]

## 2020-06-03 ENCOUNTER — Encounter
Admission: RE | Admit: 2020-06-03 | Discharge: 2020-06-03 | Disposition: A | Discharge status: Home / Self Care | Payer: PPO | Source: Ambulatory Visit | Attending: Surgery | Admitting: Surgery

## 2020-06-03 ENCOUNTER — Ambulatory Visit: Payer: Self-pay | Admitting: Surgery

## 2020-06-03 DIAGNOSIS — K388 Other specified diseases of appendix: Secondary | ICD-10-CM

## 2020-06-03 DIAGNOSIS — Z01818 Encounter for other preprocedural examination: Secondary | ICD-10-CM | POA: Insufficient documentation

## 2020-06-03 DIAGNOSIS — Z136 Encounter for screening for cardiovascular disorders: Secondary | ICD-10-CM | POA: Diagnosis not present

## 2020-06-03 LAB — COMPREHENSIVE METABOLIC PANEL
ALT: 17 U/L (ref 0–44)
AST: 20 U/L (ref 15–41)
Albumin: 4.1 g/dL (ref 3.5–5.0)
Alkaline Phosphatase: 51 U/L (ref 38–126)
Anion gap: 8 (ref 5–15)
BUN: 16 mg/dL (ref 8–23)
CO2: 29 mmol/L (ref 22–32)
Calcium: 9.3 mg/dL (ref 8.9–10.3)
Chloride: 105 mmol/L (ref 98–111)
Creatinine, Ser: 0.96 mg/dL (ref 0.44–1.00)
GFR, Estimated: >60 mL/min (ref >60)
Glucose, Bld: 118 mg/dL — ABNORMAL HIGH (ref 70–99)
Potassium: 4 mmol/L (ref 3.5–5.1)
Sodium: 142 mmol/L (ref 135–145)
Total Bilirubin: 0.5 mg/dL (ref 0.3–1.2)
Total Protein: 7.9 g/dL (ref 6.5–8.1)

## 2020-06-03 LAB — CBC WITH DIFFERENTIAL/PLATELET
Abs Immature Granulocytes: 0.03 10*3/uL (ref 0.00–0.07)
Basophils Absolute: 0.1 10*3/uL (ref 0.0–0.1)
Basophils Relative: 1 %
Eosinophils Absolute: 0 10*3/uL (ref 0.0–0.5)
Eosinophils Relative: 0 %
HCT: 43.3 % (ref 36.0–46.0)
Hemoglobin: 14 g/dL (ref 12.0–15.0)
Immature Granulocytes: 0 %
Lymphocytes Relative: 25 %
Lymphs Abs: 2.2 10*3/uL (ref 0.7–4.0)
MCH: 27.9 pg (ref 26.0–34.0)
MCHC: 32.3 g/dL (ref 30.0–36.0)
MCV: 86.4 fL (ref 80.0–100.0)
Monocytes Absolute: 0.5 10*3/uL (ref 0.1–1.0)
Monocytes Relative: 6 %
Neutro Abs: 5.8 10*3/uL (ref 1.7–7.7)
Neutrophils Relative %: 68 %
Platelets: 293 10*3/uL (ref 150–400)
RBC: 5.01 MIL/uL (ref 3.87–5.11)
RDW: 12.9 % (ref 11.5–15.5)
WBC: 8.5 10*3/uL (ref 4.0–10.5)
nRBC: 0 % (ref 0.0–0.2)

## 2020-06-03 LAB — TYPE AND SCREEN
ABO/RH(D): O POS
Antibody Screen: NEGATIVE

## 2020-06-04 ENCOUNTER — Other Ambulatory Visit: Payer: PPO

## 2020-06-05 ENCOUNTER — Other Ambulatory Visit: Payer: Self-pay | Admitting: Family Medicine

## 2020-06-09 DIAGNOSIS — U071 COVID-19: Secondary | ICD-10-CM

## 2020-06-09 HISTORY — DX: COVID-19: U07.1

## 2020-06-11 ENCOUNTER — Other Ambulatory Visit: Payer: Self-pay

## 2020-06-11 ENCOUNTER — Other Ambulatory Visit
Admission: RE | Admit: 2020-06-11 | Discharge: 2020-06-11 | Disposition: A | Discharge status: Home / Self Care | Payer: PPO | Source: Ambulatory Visit | Attending: Surgery | Admitting: Surgery

## 2020-06-11 ENCOUNTER — Other Ambulatory Visit: Payer: PPO

## 2020-06-11 DIAGNOSIS — D490 Neoplasm of unspecified behavior of digestive system: Secondary | ICD-10-CM | POA: Diagnosis present

## 2020-06-11 DIAGNOSIS — Z20822 Contact with and (suspected) exposure to covid-19: Secondary | ICD-10-CM | POA: Diagnosis present

## 2020-06-11 DIAGNOSIS — Z90710 Acquired absence of both cervix and uterus: Secondary | ICD-10-CM | POA: Diagnosis not present

## 2020-06-11 DIAGNOSIS — D121 Benign neoplasm of appendix: Secondary | ICD-10-CM | POA: Diagnosis not present

## 2020-06-11 DIAGNOSIS — E785 Hyperlipidemia, unspecified: Secondary | ICD-10-CM | POA: Diagnosis present

## 2020-06-11 DIAGNOSIS — Z87442 Personal history of urinary calculi: Secondary | ICD-10-CM | POA: Diagnosis not present

## 2020-06-11 DIAGNOSIS — K219 Gastro-esophageal reflux disease without esophagitis: Secondary | ICD-10-CM | POA: Diagnosis present

## 2020-06-11 DIAGNOSIS — T8131XA Disruption of external operation (surgical) wound, not elsewhere classified, initial encounter: Secondary | ICD-10-CM | POA: Diagnosis not present

## 2020-06-11 DIAGNOSIS — M810 Age-related osteoporosis without current pathological fracture: Secondary | ICD-10-CM | POA: Diagnosis present

## 2020-06-11 DIAGNOSIS — Z8249 Family history of ischemic heart disease and other diseases of the circulatory system: Secondary | ICD-10-CM | POA: Diagnosis not present

## 2020-06-11 DIAGNOSIS — Y838 Other surgical procedures as the cause of abnormal reaction of the patient, or of later complication, without mention of misadventure at the time of the procedure: Secondary | ICD-10-CM | POA: Diagnosis not present

## 2020-06-11 DIAGNOSIS — Z01812 Encounter for preprocedural laboratory examination: Secondary | ICD-10-CM | POA: Insufficient documentation

## 2020-06-11 DIAGNOSIS — K388 Other specified diseases of appendix: Secondary | ICD-10-CM | POA: Diagnosis present

## 2020-06-11 DIAGNOSIS — H409 Unspecified glaucoma: Secondary | ICD-10-CM | POA: Diagnosis present

## 2020-06-11 DIAGNOSIS — Z823 Family history of stroke: Secondary | ICD-10-CM | POA: Diagnosis not present

## 2020-06-11 LAB — SARS CORONAVIRUS 2 (TAT 6-24 HRS): SARS Coronavirus 2: NEGATIVE

## 2020-06-12 LAB — CEA: CEA: 12.4 ng/mL — ABNORMAL HIGH (ref 0.0–4.7)

## 2020-06-12 MED ORDER — CHLORHEXIDINE GLUCONATE 0.12 % MT SOLN: 15.0000 mL | Freq: Once | OROMUCOSAL | Status: DC

## 2020-06-12 MED ORDER — CELECOXIB 200 MG PO CAPS: 200.0000 mg | ORAL_CAPSULE | ORAL | Status: AC

## 2020-06-12 MED ORDER — ALVIMOPAN 12 MG PO CAPS: 12.0000 mg | ORAL_CAPSULE | ORAL | Status: AC

## 2020-06-12 MED ORDER — SODIUM CHLORIDE 0.9 % IV SOLN
1.0000 g | INTRAVENOUS | Status: AC
  Administered 2020-06-13: 1 g via INTRAVENOUS
  Filled 2020-06-12: qty 1

## 2020-06-12 MED ORDER — LACTATED RINGERS IV SOLN: INTRAVENOUS | Status: DC

## 2020-06-12 MED ORDER — ACETAMINOPHEN 500 MG PO TABS: 1000.0000 mg | ORAL_TABLET | ORAL | Status: AC

## 2020-06-12 MED ORDER — ORAL CARE MOUTH RINSE: 15.0000 mL | Freq: Once | OROMUCOSAL | Status: DC

## 2020-06-12 MED ORDER — GABAPENTIN 300 MG PO CAPS: 300.0000 mg | ORAL_CAPSULE | ORAL | Status: AC

## 2020-06-12 MED ORDER — BUPIVACAINE LIPOSOME 1.3 % IJ SUSP: 20.0000 mL | Freq: Once | INTRAMUSCULAR | Status: DC

## 2020-06-13 ENCOUNTER — Other Ambulatory Visit: Payer: Self-pay

## 2020-06-13 ENCOUNTER — Inpatient Hospital Stay
Admission: RE | Admit: 2020-06-13 | Discharge: 2020-06-16 | DRG: 330 | Disposition: A | Discharge status: Home / Self Care | Payer: PPO | Attending: Surgery | Admitting: Surgery

## 2020-06-13 ENCOUNTER — Inpatient Hospital Stay: Payer: PPO | Admitting: Urgent Care

## 2020-06-13 ENCOUNTER — Encounter
Admission: RE | Disposition: A | Discharge status: Home / Self Care | Payer: Self-pay | Source: Home / Self Care | Attending: Surgery

## 2020-06-13 ENCOUNTER — Encounter: Payer: Self-pay | Admitting: Surgery

## 2020-06-13 DIAGNOSIS — K388 Other specified diseases of appendix: Secondary | ICD-10-CM | POA: Diagnosis not present

## 2020-06-13 DIAGNOSIS — Z90710 Acquired absence of both cervix and uterus: Secondary | ICD-10-CM

## 2020-06-13 DIAGNOSIS — Z823 Family history of stroke: Secondary | ICD-10-CM | POA: Diagnosis not present

## 2020-06-13 DIAGNOSIS — E785 Hyperlipidemia, unspecified: Secondary | ICD-10-CM | POA: Diagnosis present

## 2020-06-13 DIAGNOSIS — K219 Gastro-esophageal reflux disease without esophagitis: Secondary | ICD-10-CM | POA: Diagnosis present

## 2020-06-13 DIAGNOSIS — Z20822 Contact with and (suspected) exposure to covid-19: Secondary | ICD-10-CM | POA: Diagnosis present

## 2020-06-13 DIAGNOSIS — T8131XA Disruption of external operation (surgical) wound, not elsewhere classified, initial encounter: Secondary | ICD-10-CM | POA: Diagnosis not present

## 2020-06-13 DIAGNOSIS — Y838 Other surgical procedures as the cause of abnormal reaction of the patient, or of later complication, without mention of misadventure at the time of the procedure: Secondary | ICD-10-CM | POA: Diagnosis not present

## 2020-06-13 DIAGNOSIS — Z87442 Personal history of urinary calculi: Secondary | ICD-10-CM

## 2020-06-13 DIAGNOSIS — M810 Age-related osteoporosis without current pathological fracture: Secondary | ICD-10-CM | POA: Diagnosis present

## 2020-06-13 DIAGNOSIS — Z8249 Family history of ischemic heart disease and other diseases of the circulatory system: Secondary | ICD-10-CM

## 2020-06-13 DIAGNOSIS — H409 Unspecified glaucoma: Secondary | ICD-10-CM | POA: Diagnosis present

## 2020-06-13 DIAGNOSIS — D490 Neoplasm of unspecified behavior of digestive system: Secondary | ICD-10-CM | POA: Diagnosis present

## 2020-06-13 HISTORY — PX: XI ROBOTIC LAPAROSCOPIC ASSISTED APPENDECTOMY: SHX6877

## 2020-06-13 LAB — ABO/RH: ABO/RH(D): O POS

## 2020-06-13 SURGERY — APPENDECTOMY, ROBOT-ASSISTED, LAPAROSCOPIC
Anesthesia: General | Laterality: Right

## 2020-06-13 MED ORDER — GABAPENTIN 300 MG PO CAPS
ORAL_CAPSULE | ORAL | Status: AC
  Administered 2020-06-13: 300 mg via ORAL
  Filled 2020-06-13: qty 1

## 2020-06-13 MED ORDER — ROCURONIUM BROMIDE 100 MG/10ML IV SOLN
INTRAVENOUS | Status: DC | PRN
  Administered 2020-06-13: 10 mg via INTRAVENOUS
  Administered 2020-06-13: 50 mg via INTRAVENOUS
  Administered 2020-06-13 (×2): 20 mg via INTRAVENOUS

## 2020-06-13 MED ORDER — TRAZODONE HCL 50 MG PO TABS: 50.0000 mg | ORAL_TABLET | Freq: Every day | ORAL | Status: DC

## 2020-06-13 MED ORDER — FENTANYL CITRATE (PF) 250 MCG/5ML IJ SOLN
INTRAMUSCULAR | Status: AC
  Filled 2020-06-13: qty 5

## 2020-06-13 MED ORDER — DEXAMETHASONE SODIUM PHOSPHATE 10 MG/ML IJ SOLN
INTRAMUSCULAR | Status: DC | PRN
  Administered 2020-06-13: 6 mg via INTRAVENOUS

## 2020-06-13 MED ORDER — FENTANYL CITRATE (PF) 100 MCG/2ML IJ SOLN
INTRAMUSCULAR | Status: DC | PRN
  Administered 2020-06-13: 25 ug via INTRAVENOUS
  Administered 2020-06-13: 50 ug via INTRAVENOUS
  Administered 2020-06-13: 25 ug via INTRAVENOUS
  Administered 2020-06-13: 50 ug via INTRAVENOUS
  Administered 2020-06-13: 100 ug via INTRAVENOUS

## 2020-06-13 MED ORDER — SODIUM CHLORIDE 0.9 % IV SOLN
1.0000 g | INTRAVENOUS | Status: AC
  Administered 2020-06-13: 1000 mg via INTRAVENOUS
  Filled 2020-06-13: qty 1

## 2020-06-13 MED ORDER — ONDANSETRON 4 MG PO TBDP: 4.0000 mg | ORAL_TABLET | Freq: Four times a day (QID) | ORAL | Status: DC | PRN

## 2020-06-13 MED ORDER — CELECOXIB 200 MG PO CAPS
ORAL_CAPSULE | ORAL | Status: AC
  Administered 2020-06-13: 200 mg via ORAL
  Filled 2020-06-13: qty 1

## 2020-06-13 MED ORDER — PANTOPRAZOLE SODIUM 40 MG IV SOLR
40.0000 mg | Freq: Every day | INTRAVENOUS | Status: DC
  Administered 2020-06-13 – 2020-06-15 (×3): 40 mg via INTRAVENOUS
  Filled 2020-06-13 (×3): qty 40

## 2020-06-13 MED ORDER — HEPARIN SODIUM (PORCINE) 5000 UNIT/ML IJ SOLN
5000.0000 [IU] | Freq: Three times a day (TID) | INTRAMUSCULAR | Status: DC
  Administered 2020-06-14 – 2020-06-16 (×7): 5000 [IU] via SUBCUTANEOUS
  Filled 2020-06-13 (×7): qty 1

## 2020-06-13 MED ORDER — ONDANSETRON HCL 4 MG/2ML IJ SOLN: 4.0000 mg | Freq: Four times a day (QID) | INTRAMUSCULAR | Status: DC | PRN

## 2020-06-13 MED ORDER — DORZOLAMIDE HCL-TIMOLOL MAL 2-0.5 % OP SOLN
1.0000 [drp] | Freq: Two times a day (BID) | OPHTHALMIC | Status: DC
  Filled 2020-06-13: qty 10

## 2020-06-13 MED ORDER — LABETALOL HCL 5 MG/ML IV SOLN
INTRAVENOUS | Status: DC | PRN
  Administered 2020-06-13: 5 mg via INTRAVENOUS

## 2020-06-13 MED ORDER — SPY AGENT GREEN - (INDOCYANINE FOR INJECTION)
INTRAMUSCULAR | Status: DC | PRN
  Administered 2020-06-13 (×4): 2.5 mg via INTRAVENOUS

## 2020-06-13 MED ORDER — MORPHINE SULFATE (PF) 2 MG/ML IV SOLN
1.0000 mg | INTRAVENOUS | Status: DC | PRN
  Administered 2020-06-14: 1 mg via INTRAVENOUS
  Filled 2020-06-13: qty 1

## 2020-06-13 MED ORDER — BUPIVACAINE-EPINEPHRINE (PF) 0.25% -1:200000 IJ SOLN
INTRAMUSCULAR | Status: DC | PRN
  Administered 2020-06-13: 30 mL

## 2020-06-13 MED ORDER — SODIUM CHLORIDE 0.9 % IV SOLN
INTRAVENOUS | Status: DC | PRN
  Administered 2020-06-13: 250 mL via INTRAVENOUS

## 2020-06-13 MED ORDER — CHLORHEXIDINE GLUCONATE CLOTH 2 % EX PADS: 6.0000 | MEDICATED_PAD | Freq: Once | CUTANEOUS | Status: DC

## 2020-06-13 MED ORDER — KETOROLAC TROMETHAMINE 30 MG/ML IJ SOLN
INTRAMUSCULAR | Status: DC | PRN
  Administered 2020-06-13: 30 mg via INTRAVENOUS

## 2020-06-13 MED ORDER — MIDAZOLAM HCL 2 MG/2ML IJ SOLN
INTRAMUSCULAR | Status: DC | PRN
  Administered 2020-06-13: 1 mg via INTRAVENOUS

## 2020-06-13 MED ORDER — ALVIMOPAN 12 MG PO CAPS
ORAL_CAPSULE | ORAL | Status: AC
  Administered 2020-06-13: 12 mg via ORAL
  Filled 2020-06-13: qty 1

## 2020-06-13 MED ORDER — BUPIVACAINE LIPOSOME 1.3 % IJ SUSP
INTRAMUSCULAR | Status: DC | PRN
  Administered 2020-06-13: 20 mL

## 2020-06-13 MED ORDER — FENTANYL CITRATE (PF) 100 MCG/2ML IJ SOLN: 25.0000 ug | INTRAMUSCULAR | Status: DC | PRN

## 2020-06-13 MED ORDER — LIDOCAINE HCL (CARDIAC) PF 100 MG/5ML IV SOSY
PREFILLED_SYRINGE | INTRAVENOUS | Status: DC | PRN
  Administered 2020-06-13: 100 mg via INTRAVENOUS

## 2020-06-13 MED ORDER — ONDANSETRON HCL 4 MG/2ML IJ SOLN
INTRAMUSCULAR | Status: DC | PRN
  Administered 2020-06-13: 4 mg via INTRAVENOUS

## 2020-06-13 MED ORDER — PROPOFOL 10 MG/ML IV BOLUS
INTRAVENOUS | Status: DC | PRN
  Administered 2020-06-13: 100 mg via INTRAVENOUS

## 2020-06-13 MED ORDER — TRAZODONE HCL 100 MG PO TABS
100.0000 mg | ORAL_TABLET | Freq: Every day | ORAL | Status: DC
  Administered 2020-06-13 – 2020-06-15 (×3): 100 mg via ORAL
  Filled 2020-06-13 (×3): qty 1

## 2020-06-13 MED ORDER — HYDROCODONE-ACETAMINOPHEN 5-325 MG PO TABS
1.0000 | ORAL_TABLET | ORAL | Status: DC | PRN
  Administered 2020-06-14: 2 via ORAL
  Filled 2020-06-13: qty 2

## 2020-06-13 MED ORDER — FENTANYL CITRATE (PF) 100 MCG/2ML IJ SOLN
INTRAMUSCULAR | Status: AC
  Filled 2020-06-13: qty 2

## 2020-06-13 MED ORDER — ACETAMINOPHEN 500 MG PO TABS
ORAL_TABLET | ORAL | Status: AC
  Administered 2020-06-13: 1000 mg via ORAL
  Filled 2020-06-13: qty 2

## 2020-06-13 MED ORDER — BUPIVACAINE-EPINEPHRINE (PF) 0.25% -1:200000 IJ SOLN
INTRAMUSCULAR | Status: AC
  Filled 2020-06-13: qty 30

## 2020-06-13 MED ORDER — ONDANSETRON HCL 4 MG/2ML IJ SOLN: 4.0000 mg | Freq: Once | INTRAMUSCULAR | Status: DC | PRN

## 2020-06-13 MED ORDER — OXYCODONE HCL 5 MG/5ML PO SOLN: 5.0000 mg | Freq: Once | ORAL | Status: DC | PRN

## 2020-06-13 MED ORDER — CHLORHEXIDINE GLUCONATE 0.12 % MT SOLN
OROMUCOSAL | Status: AC
  Filled 2020-06-13: qty 15

## 2020-06-13 MED ORDER — SUGAMMADEX SODIUM 200 MG/2ML IV SOLN
INTRAVENOUS | Status: DC | PRN
  Administered 2020-06-13: 200 mg via INTRAVENOUS

## 2020-06-13 MED ORDER — PHENYLEPHRINE HCL (PRESSORS) 10 MG/ML IV SOLN
INTRAVENOUS | Status: DC | PRN
  Administered 2020-06-13: 100 ug via INTRAVENOUS

## 2020-06-13 MED ORDER — LABETALOL HCL 5 MG/ML IV SOLN
INTRAVENOUS | Status: AC
  Filled 2020-06-13: qty 4

## 2020-06-13 MED ORDER — OXYCODONE HCL 5 MG PO TABS: 5.0000 mg | ORAL_TABLET | Freq: Once | ORAL | Status: DC | PRN

## 2020-06-13 MED ORDER — SUCCINYLCHOLINE CHLORIDE 20 MG/ML IJ SOLN: INTRAMUSCULAR | Status: DC | PRN

## 2020-06-13 MED ORDER — PROPOFOL 10 MG/ML IV BOLUS
INTRAVENOUS | Status: AC
  Filled 2020-06-13: qty 20

## 2020-06-13 MED ORDER — ROCURONIUM BROMIDE 10 MG/ML (PF) SYRINGE
PREFILLED_SYRINGE | INTRAVENOUS | Status: AC
  Filled 2020-06-13: qty 10

## 2020-06-13 MED ORDER — SODIUM CHLORIDE 0.9 % IV SOLN: INTRAVENOUS | Status: DC

## 2020-06-13 MED ORDER — MIDAZOLAM HCL 2 MG/2ML IJ SOLN
INTRAMUSCULAR | Status: AC
  Filled 2020-06-13: qty 2

## 2020-06-13 MED ORDER — DORZOLAMIDE HCL-TIMOLOL MAL 2-0.5 % OP SOLN
1.0000 [drp] | Freq: Two times a day (BID) | OPHTHALMIC | Status: DC
  Administered 2020-06-14 – 2020-06-16 (×5): 1 [drp] via OPHTHALMIC
  Filled 2020-06-13: qty 10

## 2020-06-13 MED ORDER — GLYCOPYRROLATE 0.2 MG/ML IJ SOLN
INTRAMUSCULAR | Status: AC
  Filled 2020-06-13: qty 1

## 2020-06-13 MED ORDER — ALVIMOPAN 12 MG PO CAPS
12.0000 mg | ORAL_CAPSULE | Freq: Two times a day (BID) | ORAL | Status: DC
  Administered 2020-06-14 (×2): 12 mg via ORAL
  Filled 2020-06-13 (×4): qty 1

## 2020-06-13 SURGICAL SUPPLY — 67 items
BAG LAPAROSCOPIC 12 15 PORT 16 (BASKET) ×1 IMPLANT
BAG RETRIEVAL 12/15 (BASKET) ×2
BAG RETRIEVAL 12/15MM (BASKET) ×1
BLADE CLIPPER SURG (BLADE) IMPLANT
CANISTER SUCT 1200ML W/VALVE (MISCELLANEOUS) ×3 IMPLANT
CHLORAPREP W/TINT 26 (MISCELLANEOUS) ×3 IMPLANT
CLIP VESOLOCK MED LG 6/CT (CLIP) IMPLANT
COVER TIP SHEARS 8 DVNC (MISCELLANEOUS) ×1 IMPLANT
COVER TIP SHEARS 8MM DA VINCI (MISCELLANEOUS) ×2
COVER WAND RF STERILE (DRAPES) ×3 IMPLANT
DECANTER SPIKE VIAL GLASS SM (MISCELLANEOUS) ×3 IMPLANT
DEFOGGER SCOPE WARMER CLEARIFY (MISCELLANEOUS) ×3 IMPLANT
DERMABOND ADVANCED (GAUZE/BANDAGES/DRESSINGS) ×2
DERMABOND ADVANCED .7 DNX12 (GAUZE/BANDAGES/DRESSINGS) ×1 IMPLANT
DRAPE ARM DVNC X/XI (DISPOSABLE) ×3 IMPLANT
DRAPE COLUMN DVNC XI (DISPOSABLE) ×1 IMPLANT
DRAPE DA VINCI XI ARM (DISPOSABLE) ×6
DRAPE DA VINCI XI COLUMN (DISPOSABLE) ×2
GLOVE ORTHO TXT STRL SZ7.5 (GLOVE) ×6 IMPLANT
GOWN STRL REUS W/ TWL LRG LVL3 (GOWN DISPOSABLE) ×4 IMPLANT
GOWN STRL REUS W/TWL LRG LVL3 (GOWN DISPOSABLE) ×8
GRASPER SUT TROCAR 14GX15 (MISCELLANEOUS) IMPLANT
INFUSOR MANOMETER BAG 3000ML (MISCELLANEOUS) IMPLANT
IRRIGATION STRYKERFLOW (MISCELLANEOUS) ×1 IMPLANT
IRRIGATOR STRYKERFLOW (MISCELLANEOUS) ×3
IRRIGATOR SUCT 8 DISP DVNC XI (IRRIGATION / IRRIGATOR) IMPLANT
IRRIGATOR SUCTION 8MM XI DISP (IRRIGATION / IRRIGATOR)
IV NS IRRIG 3000ML ARTHROMATIC (IV SOLUTION) IMPLANT
KIT PINK PAD W/HEAD ARE REST (MISCELLANEOUS) ×3
KIT PINK PAD W/HEAD ARM REST (MISCELLANEOUS) ×1 IMPLANT
KIT TURNOVER KIT A (KITS) ×3 IMPLANT
LABEL OR SOLS (LABEL) ×3 IMPLANT
MANIFOLD NEPTUNE II (INSTRUMENTS) ×3 IMPLANT
NEEDLE HYPO 22GX1.5 SAFETY (NEEDLE) ×3 IMPLANT
NEEDLE INSUFFLATION 14GA 120MM (NEEDLE) IMPLANT
NS IRRIG 500ML POUR BTL (IV SOLUTION) ×3 IMPLANT
PACK LAP CHOLECYSTECTOMY (MISCELLANEOUS) ×3 IMPLANT
POUCH SPECIMEN RETRIEVAL 10MM (ENDOMECHANICALS) IMPLANT
RELOAD STAPLER 3.5X45 BLU DVNC (STAPLE) IMPLANT
RELOAD STAPLER 3.5X60 BLU DVNC (STAPLE) ×4 IMPLANT
SEAL CANN UNIV 5-8 DVNC XI (MISCELLANEOUS) ×3 IMPLANT
SEAL XI 5MM-8MM UNIVERSAL (MISCELLANEOUS) ×6
SEALER VESSEL DA VINCI XI (MISCELLANEOUS) ×2
SEALER VESSEL EXT DVNC XI (MISCELLANEOUS) ×1 IMPLANT
SET TUBE SMOKE EVAC HIGH FLOW (TUBING) ×3 IMPLANT
SOLUTION ELECTROLUBE (MISCELLANEOUS) ×3 IMPLANT
SPONGE LAP 4X18 RFD (DISPOSABLE) ×3 IMPLANT
STAPLER 45 DA VINCI SURE FORM (STAPLE)
STAPLER 45 SUREFORM DVNC (STAPLE) IMPLANT
STAPLER 60 DA VINCI SURE FORM (STAPLE) ×2
STAPLER 60 SUREFORM DVNC (STAPLE) ×1 IMPLANT
STAPLER RELOAD 3.5X45 BLU DVNC (STAPLE)
STAPLER RELOAD 3.5X45 BLUE (STAPLE)
STAPLER RELOAD 3.5X60 BLU DVNC (STAPLE) ×4
STAPLER RELOAD 3.5X60 BLUE (STAPLE) ×8
SUT MNCRL 4-0 (SUTURE) ×2
SUT MNCRL 4-0 27XMFL (SUTURE) ×1
SUT PDS AB 0 CT1 27 (SUTURE) ×6 IMPLANT
SUT VICRYL 0 AB UR-6 (SUTURE) ×3 IMPLANT
SUT VLOC 90 6 CV-15 VIOLET (SUTURE) ×2 IMPLANT
SUT VLOC 90 6" CV-15 VIOLET (SUTURE) ×1
SUTURE MNCRL 4-0 27XMF (SUTURE) ×1 IMPLANT
SYS TROCAR 1.5-3 SLV ABD GEL (ENDOMECHANICALS) ×3
SYSTEM TROCR 1.5-3 SLV ABD GEL (ENDOMECHANICALS) ×1 IMPLANT
TRAY FOLEY SLVR 16FR LF STAT (SET/KITS/TRAYS/PACK) ×3 IMPLANT
TROCAR Z-THREAD FIOS 11X100 BL (TROCAR) ×3 IMPLANT
WATER STERILE IRR 3000ML UROMA (IV SOLUTION) ×3 IMPLANT

## 2020-06-13 NOTE — Anesthesia Preprocedure Evaluation (Signed)
Anesthesia Evaluation  Patient identified by MRN, date of birth, ID band Patient awake    Reviewed: Allergy & Precautions, NPO status , Patient's Chart, lab work & pertinent test results  History of Anesthesia Complications Negative for: history of anesthetic complications  Airway Mallampati: II  TM Distance: >3 FB Neck ROM: Full    Dental no notable dental hx. (+) Partial Upper, Teeth Intact   Pulmonary neg pulmonary ROS, neg sleep apnea, neg COPD, Patient abstained from smoking.Not current smoker,    Pulmonary exam normal breath sounds clear to auscultation       Cardiovascular Exercise Tolerance: Good METS(-) hypertension(-) CAD and (-) Past MI negative cardio ROS  (-) dysrhythmias  Rhythm:Regular Rate:Normal - Systolic murmurs    Neuro/Psych  Headaches, PSYCHIATRIC DISORDERS Depression    GI/Hepatic GERD  Medicated and Controlled,(+)     (-) substance abuse  ,   Endo/Other  neg diabetes  Renal/GU Renal diseaseStaghorn calculus     Musculoskeletal   Abdominal   Peds  Hematology   Anesthesia Other Findings Past Medical History: No date: Depression     Comment:  after husbands death No date: GERD (gastroesophageal reflux disease) No date: History of chicken pox No date: History of kidney stones No date: History of measles No date: Migraine  Reproductive/Obstetrics                             Anesthesia Physical Anesthesia Plan  ASA: II  Anesthesia Plan: General   Post-op Pain Management:    Induction: Intravenous  PONV Risk Score and Plan: 4 or greater and Ondansetron, Dexamethasone and Treatment may vary due to age or medical condition  Airway Management Planned: Oral ETT  Additional Equipment: None  Intra-op Plan:   Post-operative Plan: Extubation in OR  Informed Consent: I have reviewed the patients History and Physical, chart, labs and discussed the procedure  including the risks, benefits and alternatives for the proposed anesthesia with the patient or authorized representative who has indicated his/her understanding and acceptance.     Dental advisory given  Plan Discussed with: CRNA and Surgeon  Anesthesia Plan Comments: (Discussed risks of anesthesia with patient, including PONV, sore throat, lip/dental damage. Rare risks discussed as well, such as cardiorespiratory and neurological sequelae. Patient understands.)        Anesthesia Quick Evaluation

## 2020-06-13 NOTE — Anesthesia Procedure Notes (Signed)
Procedure Name: Intubation Date/Time: 06/13/2020 9:30 AM Performed by: Allean Found, CRNA Pre-anesthesia Checklist: Patient identified, Patient being monitored, Timeout performed, Emergency Drugs available and Suction available Patient Re-evaluated:Patient Re-evaluated prior to induction Oxygen Delivery Method: Circle system utilized Preoxygenation: Pre-oxygenation with 100% oxygen Induction Type: IV induction Ventilation: Mask ventilation without difficulty Laryngoscope Size: 3 and McGraph Grade View: Grade I Tube type: Oral Tube size: 6.5 mm Number of attempts: 1 Airway Equipment and Method: Stylet Placement Confirmation: ETT inserted through vocal cords under direct vision,  positive ETCO2 and breath sounds checked- equal and bilateral Secured at: 21 cm Tube secured with: Tape Dental Injury: Teeth and Oropharynx as per pre-operative assessment

## 2020-06-13 NOTE — H&P (Signed)
Patient ID: Gabrielle Barnes, female   DOB: 09-30-1950, 69 y.o.   MRN: [id number]  Chief Complaint:  Mucocele of the appendix  History of Present Illness Gabrielle Barnes is a 69 y.o. 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  Initially had work-up for acute onset of lumbar back pain, CT urogram confirmed bilateral staghorn calculi.  The CT also identified the appendiceal mass.  Past Medical History Past Medical History:  Diagnosis Date  . Depression    after husbands death  . GERD (gastroesophageal reflux disease)   . History of chicken pox   . History of kidney stones   . History of measles   . Migraine       Past Surgical History:  Procedure Laterality Date  . ABDOMINAL HYSTERECTOMY    . CESAREAN SECTION    . colonoscopy      No Known Allergies  Current Facility-Administered Medications  Medication Dose Route Frequency Provider Last Rate Last Admin  . acetaminophen (TYLENOL) 500 MG tablet           . acetaminophen (TYLENOL) tablet 1,000 mg  1,000 mg Oral On Call to OR Ronny Bacon, MD      . alvimopan (ENTEREG) 12 MG capsule           . alvimopan (ENTEREG) capsule 12 mg  12 mg Oral On Call to OR Ronny Bacon, MD      . bupivacaine liposome (EXPAREL) 1.3 % injection 266 mg  20 mL Infiltration Once Ronny Bacon, MD      . celecoxib (CELEBREX) 200 MG capsule           . celecoxib (CELEBREX) capsule 200 mg  200 mg Oral On Call to OR Ronny Bacon, MD      . chlorhexidine (PERIDEX) 0.12 % solution 15 mL  15 mL Mouth/Throat Once Tera Mater, MD       Or  . MEDLINE mouth rinse  15 mL Mouth Rinse Once Tera Mater, MD      . chlorhexidine (PERIDEX) 0.12 % solution           . Chlorhexidine Gluconate Cloth 2 % PADS 6 each  6 each Topical Once Ronny Bacon, MD       And  . Chlorhexidine Gluconate Cloth 2 % PADS 6 each  6 each Topical Once Ronny Bacon, MD      . ertapenem [facility]) 1,000 mg in sodium chloride  0.9 % 100 mL IVPB  1 g Intravenous On Call to OR Ronny Bacon, MD      . gabapentin (NEURONTIN) 300 MG capsule           . gabapentin (NEURONTIN) capsule 300 mg  300 mg Oral On Call to OR Ronny Bacon, MD      . lactated ringers infusion   Intravenous Continuous Lubertha Basque Brett Canales, MD        Family History Family History  Problem Relation Age of Onset  . Dementia Mother   . Stroke Father   . Hypertension Father   . Cancer Neg Hx   . Diabetes Neg Hx       Social History Social History   Tobacco Use  . Smoking status: Never Smoker  . Smokeless tobacco: Never Used  Vaping Use  . Vaping Use: Never used  Substance Use Topics  . Alcohol use: No    Alcohol/week: 0.0 standard drinks  . Drug use: No  ROS Constitutional: Negative.   HENT: Negative.   Eyes: Negative.   Respiratory: Negative.   Cardiovascular: Negative.   Gastrointestinal: Negative.   Genitourinary: Negative for dysuria, flank pain and hematuria.  Skin: Negative.   Neurological: Negative.   Psychiatric/Behavioral: Negative.     Physical Exam Blood pressure (!) 169/78, pulse 67, temperature 97.9 F (36.6 C), temperature source Temporal, resp. rate 16, weight 69.9 kg, SpO2 99 %. Last Weight  Most recent update: 06/13/2020  8:50 AM   Weight  69.9 kg (154 lb 1.6 oz)           CONSTITUTIONAL: Well developed, and nourished, appropriately responsive and aware without distress.   EYES: Sclera non-icteric.   EARS, NOSE, MOUTH AND THROAT: Mask worn.  Hearing is intact to voice.  NECK: Trachea is midline, and there is no jugular venous distension.  LYMPH NODES:  Lymph nodes in the neck are not enlarged. RESPIRATORY:  Lungs are clear, and breath sounds are equal bilaterally. Normal respiratory effort without pathologic use of accessory muscles. CARDIOVASCULAR: Heart is regular in rate and rhythm. GI: The abdomen is soft, nontender, and nondistended. There were no palpable masses. I did not  appreciate hepatosplenomegaly. There were normal bowel sounds. MUSCULOSKELETAL:  Symmetrical muscle tone appreciated in all four extremities.    SKIN: Skin turgor is normal. No pathologic skin lesions appreciated.  NEUROLOGIC:  Motor and sensation appear grossly normal.  Cranial nerves are grossly without defect. PSYCH:  Alert and oriented to person, place and time. Affect is appropriate for situation.   Data Reviewed I have personally reviewed what is currently available of the patient's imaging, recent labs and medical records.   Labs:  CBC Latest Ref Rng & Units 06/03/2020 09/10/2013  WBC 4.0 - 10.5 K/uL 8.5 8.4  Hemoglobin 12.0 - 15.0 g/dL 14.0 16.1(A)  Hematocrit 36 - 46 % 43.3 48(A)  Platelets 150 - 400 K/uL 293 295   CMP Latest Ref Rng & Units 06/03/2020 05/02/2020 03/01/2018  Glucose 70 - 99 mg/dL 118(H) - 97  BUN 8 - 23 mg/dL 16 - 15  Creatinine 0.44 - 1.00 mg/dL 0.96 1.20(H) 0.82  Sodium 135 - 145 mmol/L 142 - 142  Potassium 3.5 - 5.1 mmol/L 4.0 - 4.4  Chloride 98 - 111 mmol/L 105 - 103  CO2 22 - 32 mmol/L 29 - 25  Calcium 8.9 - 10.3 mg/dL 9.3 - 9.3  Total Protein 6.5 - 8.1 g/dL 7.9 - 7.3  Total Bilirubin 0.3 - 1.2 mg/dL 0.5 - 0.3  Alkaline Phos 38 - 126 U/L 51 - 65  AST 15 - 41 U/L 20 - 20  ALT 0 - 44 U/L 17 - 20   Imaging: Radiology review:     CLINICAL DATA: Bilateral renal staghorn calculi.  EXAM: CT ABDOMEN AND PELVIS WITHOUT AND WITH CONTRAST  TECHNIQUE: Multidetector CT imaging of the abdomen and pelvis was performed following the standard protocol before and following the bolus administration of intravenous contrast.  CONTRAST: 165mL OMNIPAQUE IOHEXOL 300 MG/ML SOLN  COMPARISON: None.  FINDINGS: Lower Chest: No acute findings.  Hepatobiliary: No hepatic masses identified. Gallbladder is unremarkable. No evidence of biliary ductal dilatation.  Pancreas: No mass or inflammatory changes.  Spleen: Within normal limits in size and  appearance.  Adrenals/Urinary Tract: Staghorn calculi are seen throughout the renal collecting systems bilaterally. No evidence of ureteral calculi or hydronephrosis. No masses seen involving the kidneys, ureters, or bladder.  Stomach/Bowel: A large mucocele of the appendix is seen which  measures 11.4 x 4.7 cm. No associated soft tissue masses visualized by CT. No evidence of bowel obstruction, inflammatory process or abnormal fluid collections.  Vascular/Lymphatic: No pathologically enlarged lymph nodes. No abdominal aortic aneurysm. Aortic atherosclerosis noted.  Reproductive: Prior hysterectomy noted. Adnexal regions are unremarkable in appearance.  Other: None.  Musculoskeletal: No suspicious bone lesions identified.  35  IMPRESSION: Bilateral staghorn calculi throughout the renal collecting systems. No evidence of ureteral calculi or hydronephrosis.  No radiographic evidence of urinary tract neoplasm.  Large mucocele of the appendix. Surgical consultation is recommended.  Aortic Atherosclerosis (ICD10-I70.0).   Electronically Signed By: Marlaine Hind M.D. On: 05/04/2020 10:29  Patient Active Problem List   Diagnosis Date Noted  . 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 Microhematuria 04/20/2020  . Osteoporosis 12/01/2016  . Allergic rhinitis 06/17/2015  . Depression 06/17/2015  . Glaucoma 06/17/2015  . 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 Headache 06/17/2015  . HLD (hyperlipidemia) 06/17/2015  . Insomnia 06/17/2015    Plan    Robotic appendectomy, possible right hemicolectomy.  Risks of laparoscopic surgery discussed with the patient and her daughter which include but not limited to anesthesia, bleeding, infection, the need for a partial colectomy, cystic rupture, recurrence etc.  I believe she understands and desires to proceed.  Believe her questions were adequately answered without any guarantees ever  expressed or implied.    Ronny Bacon M.D., FACS 06/13/2020, 9:04 AM

## 2020-06-13 NOTE — Transfer of Care (Signed)
Immediate Anesthesia Transfer of Care Note  Patient: Gabrielle Barnes  Procedure(s) Performed: XI ROBOTIC LAPAROSCOPIC ASSISTED APPENDECTOMY, RIGHT COLECTOMY (Right )  Patient Location: PACU  Anesthesia Type:General  Level of Consciousness: sedated  Airway & Oxygen Therapy: Patient Spontanous Breathing and Patient connected to face mask oxygen  Post-op Assessment: Report given to RN and Post -op Vital signs reviewed and stable  Post vital signs: Reviewed and stable  Last Vitals:  Vitals Value Taken Time  BP 171/74 06/13/20 1414  Temp 36.3 C 06/13/20 1414  Pulse 47 06/13/20 1415  Resp 17 06/13/20 1415  SpO2 100 % 06/13/20 1415  Vitals shown include unvalidated device data.  Last Pain:  Vitals:   06/13/20 0848  TempSrc: Temporal  PainSc: 0-No pain         Complications: No complications documented.

## 2020-06-13 NOTE — Op Note (Signed)
Right hemicolectomy, robotic assisted/laparoscopic  Pre-operative Diagnosis: Mucocele of the appendix  Post-operative Diagnosis: same.    Surgeon: Ronny Bacon, M.D., [facility]  Anesthesia: General endotracheal  Findings: As anticipated, large tubular appendiceal mass, no other intraperitoneal findings of suspicion.  Estimated Blood Loss: 50 mL         Specimens: Right colon          Complications: none              Procedure Details  The patient was seen again in the Holding Room. The benefits, complications, treatment options, and expected outcomes were discussed with the patient. The risks of bleeding, infection, recurrence of symptoms, failure to resolve symptoms, unanticipated injury, prosthetic placement, prosthetic infection, any of which could require further surgery were reviewed with the patient. The likelihood of improving the patient's symptoms with return to their baseline status is good.  The patient and/or family concurred with the proposed plan, giving informed consent.  The patient was taken to Operating Room, identified and the procedure verified.    Prior to the induction of general anesthesia, antibiotic prophylaxis was administered. VTE prophylaxis was in place.  General anesthesia was then administered and tolerated well. After the induction, the patient was positioned in the supine position and the abdomen was prepped with Chloraprep and draped in the sterile fashion.  Foley catheter was placed. A Time Out was held and the above information confirmed.  I utilized the lowest aspect of the infraumbilical scar to enter the abdomen.  Then placed a gel point retractor, mini.  We then insufflated the abdomen.  A 12 mm robotic trocar was placed through the gel point, and under direct visualization we were able to appreciate the appendiceal mass.  I proceeded with anticipating a right colectomy and placed two 8.5 millimeters robotic trochars on the left abdominal wall. Then  proceeded with mobilizing the appendix via its lateral abdominal wall adhesions, dividing Toltz line and mobilizing the proximal right colon.  The distal ileum appeared to be somewhat adherent to the appendiceal mass, raising concerns that there may not be a point of resection between the appendiceal mass, cecum right colon.  Felt prudent to proceed with dividing the distal ileal mesentery with the vessel sealer, to a point where I could resect the distal ileum. 60 mm robotic stapler was utilized to divide the distal ileum, this allowed further mobilization of the appendiceal mass with the distal ileum and cecum to further mobilize the right colon. Utilizing the retromesenteric plane we mobilized the right colon from medial to lateral.  I chose a point on the right colon to proceed with division. 2 firings of the 60 mm stapler were utilized to completely transect the distal right colon.  The remaining hepatic flexure was then mobilized further. Specimen was somewhat in the way in regard to proceeding with the anastomosis so I proceeded with extraction of the specimen through the suprapubic gel point. During transfer the specimen to the back a small hole was made within the cyst, but no spillage was noted. The instrument involved was removed and cleansed.  Then brought the distal ileum up to the proximal transverse colon and did an isoperistaltic side-to-side anastomosis.  Enterotomies were created on both the transverse colon and the distal ileum, the 60 mm stapler was utilized.  The common channel was then closed with 2 layers of 3-0 V-Loc.  Firefly was utilized to confirm adequate vascularity to the anastomosis. Then proceeded to undocked the robot and complete the  procedure laparoscopically. I decided to irrigate the abdomen with 3 L of sterile water.  Confirming adequate hemostasis and minimizing contamination whatsoever. We then removed the gel point, closed the fascia with a running 0 PDS.  Then  confirmed under laparoscopy the closure.  We then desufflated the abdomen remove the ports.  Irrigated subcutaneous tissues and closed the skin with running and interrupted 4-0 Monocryl.  Dermabond was applied.  She tolerated procedure well; specimen was sent directly to pathology.      Ronny Bacon M.D., [facility] 06/13/2020 2:15 PM

## 2020-06-13 NOTE — Interval H&P Note (Signed)
History and Physical Interval Note:  06/13/2020 9:08 AM  Gabrielle Barnes  has presented today for surgery, with the diagnosis of Mucocele of appendix.  The various methods of treatment have been discussed with the patient and family. After consideration of risks, benefits and other options for treatment, the patient has consented to  Procedure(s): XI ROBOTIC LAPAROSCOPIC ASSISTED APPENDECTOMY, possible right colectomy (N/A) as a surgical intervention.  The patient's history has been reviewed, patient examined, no change in status, stable for surgery.  I have reviewed the patient's chart and labs.  Questions were answered to the patient's satisfaction.     Ronny Bacon

## 2020-06-14 LAB — CBC
HCT: 36.2 % (ref 36.0–46.0)
Hemoglobin: 11.8 g/dL — ABNORMAL LOW (ref 12.0–15.0)
MCH: 27.8 pg (ref 26.0–34.0)
MCHC: 32.6 g/dL (ref 30.0–36.0)
MCV: 85.2 fL (ref 80.0–100.0)
Platelets: 268 10*3/uL (ref 150–400)
RBC: 4.25 MIL/uL (ref 3.87–5.11)
RDW: 12.9 % (ref 11.5–15.5)
WBC: 15.3 10*3/uL — ABNORMAL HIGH (ref 4.0–10.5)
nRBC: 0 % (ref 0.0–0.2)

## 2020-06-14 LAB — BASIC METABOLIC PANEL
Anion gap: 7 (ref 5–15)
BUN: 11 mg/dL (ref 8–23)
CO2: 24 mmol/L (ref 22–32)
Calcium: 8 mg/dL — ABNORMAL LOW (ref 8.9–10.3)
Chloride: 107 mmol/L (ref 98–111)
Creatinine, Ser: 0.73 mg/dL (ref 0.44–1.00)
GFR, Estimated: >60 mL/min (ref >60)
Glucose, Bld: 120 mg/dL — ABNORMAL HIGH (ref 70–99)
Potassium: 3.6 mmol/L (ref 3.5–5.1)
Sodium: 138 mmol/L (ref 135–145)

## 2020-06-14 MED ORDER — KETOROLAC TROMETHAMINE 15 MG/ML IJ SOLN
15.0000 mg | Freq: Four times a day (QID) | INTRAMUSCULAR | Status: DC
  Administered 2020-06-14 – 2020-06-16 (×8): 15 mg via INTRAVENOUS
  Filled 2020-06-14 (×8): qty 1

## 2020-06-14 MED ORDER — BOOST / RESOURCE BREEZE PO LIQD CUSTOM
1.0000 | Freq: Three times a day (TID) | ORAL | Status: DC
  Administered 2020-06-14 – 2020-06-15 (×2): 1 via ORAL

## 2020-06-14 MED ORDER — OXYCODONE HCL 5 MG PO TABS: 5.0000 mg | ORAL_TABLET | ORAL | Status: DC | PRN

## 2020-06-14 MED ORDER — ACETAMINOPHEN 500 MG PO TABS
1000.0000 mg | ORAL_TABLET | Freq: Four times a day (QID) | ORAL | Status: DC
  Administered 2020-06-14 – 2020-06-16 (×8): 1000 mg via ORAL
  Filled 2020-06-14 (×8): qty 2

## 2020-06-14 NOTE — Progress Notes (Signed)
CC: Mucocele of appendix Subjective: Postoperative day #1 for right colectomy.  Doing well.  No flatus.  Pain is well controlled.  We noticed a superficial dehiscence of the suprapubic port Reactive leukocytosis. Objective: Vital signs in last 24 hours: Temp:  [97.6 F (36.4 C)-98.6 F (37 C)] 98 F (36.7 C) (11/06 1119) Pulse Rate:  [54-72] 63 (11/06 1119) Resp:  [10-16] 15 (11/06 1119) BP: (121-183)/(55-95) 121/58 (11/06 1119) SpO2:  [94 %-100 %] 98 % (11/06 1119) Weight:  [72.9 kg] 72.9 kg (11/05 2000) Last BM Date: 06/13/20  Intake/Output from previous day: 11/05 0701 - 11/06 0700 In: 2125 [I.V.:2025; IV Piggyback:100] Out: 250 [Urine:200; Blood:50] Intake/Output this shift: Total I/O In: 1387.9 [I.V.:1387.9] Out: -   Physical exam: NAD, alert Abd: soft, incisions c/d/i there is superficial dehiscence of suprapubic wound.  No evidence of dehiscence of the fascia.  No peritonitis  Lab Results: CBC  Recent Labs    06/14/20 0422  WBC 15.3*  HGB 11.8*  HCT 36.2  PLT 268   BMET Recent Labs    06/14/20 0422  NA 138  K 3.6  CL 107  CO2 24  GLUCOSE 120*  BUN 11  CREATININE 0.73  CALCIUM 8.0*   PT/INR No results for input(s): LABPROT, INR in the last 72 hours. ABG No results for input(s): PHART, HCO3 in the last 72 hours.  Invalid input(s): PCO2, PO2  Studies/Results: No results found.  Anti-infectives: Anti-infectives (From admission, onward)   Start     Dose/Rate Route Frequency Ordered Stop   06/13/20 1800  ertapenem (INVANZ) 1,000 mg in sodium chloride 0.9 % 50 mL IVPB        1 g 100 mL/hr over 30 Minutes Intravenous Every 24 hours 06/13/20 1706 06/13/20 1936   06/13/20 0600  ertapenem (INVANZ) 1,000 mg in sodium chloride 0.9 % 100 mL IVPB        1 g 200 mL/hr over 30 Minutes Intravenous On call to O.R. 06/12/20 2242 06/13/20 0954      Assessment/Plan:  Doing well after right colectomy. Superficial dehiscence of suprapubic wound, daily  wound care. May ambulate Continue clear liquid diet for now Labs in the morning  Caroleen Hamman, MD, [facility]  06/14/2020

## 2020-06-14 NOTE — Progress Notes (Signed)
Noted this morning lower abdominal incision with small amount bloody drainage with small opening, dressing applied. Notified oncoming nurse.

## 2020-06-14 NOTE — Progress Notes (Signed)
Initial Nutrition Assessment  DOCUMENTATION CODES:   Obesity unspecified  INTERVENTION:   Boost Breeze po TID, each supplement provides 250 kcal and 9 grams of protein  Ensure Enlive po BID with diet advancement, each supplement provides 350 kcal and 20 grams of protein  NUTRITION DIAGNOSIS:   Increased nutrient needs related to post-op healing as evidenced by increased estimated needs.  GOAL:   Patient will meet greater than or equal to 90% of their needs  MONITOR:   PO intake, Supplement acceptance, Labs, Diet advancement, Weight trends, Skin, I & O's  REASON FOR ASSESSMENT:   Malnutrition Screening Tool    ASSESSMENT:   69 y.o. female with h/o depression and GERD who is admitted with an incidentally discovered 11 cm x 5 cm mucocele of the appendix, bilateral staghorn calculi and appendiceal mass now s/p robotic appendectomy and right hemicolectomy 11/5  RD working remotely.  Spoke with pt and pt's daughter via phone. Daughter reports pt with fairly good appetite and oral intake; pt eating lunch at time of RD visit. Pt currently on clear liquids. Pt with good appetite and oral intake at baseline. RD discussed with pt's daughter the importance of adequate protein needed to preserve lean muscle. Pt is willing to drink orange Boost Breeze and vanilla Ensure once her diet is advanced. RD will add supplements to help pt meet her estimated needs and to support post-op healing. Per chart, pt appears fairly weight stable at baseline.   Medications reviewed and include: heparin, protonix, NaCl @100ml /hr  Labs reviewed: wbc- 15.3(H)  NUTRITION - FOCUSED PHYSICAL EXAM: Unable to perform at this time   Diet Order:   Diet Order            Diet clear liquid Room service appropriate? Yes; Fluid consistency: Thin  Diet effective now                EDUCATION NEEDS:   Education needs have been addressed  Skin:  Skin Assessment: Reviewed RN Assessment (incision  abdomen)  Last BM:  11/5  Height:   Ht Readings from Last 1 Encounters:  06/13/20 5' (1.524 m)    Weight:   Wt Readings from Last 1 Encounters:  06/13/20 72.9 kg    Ideal Body Weight:  45.45 kg  BMI:  Body mass index is 31.39 kg/m.  Estimated Nutritional Needs:   Kcal:  1500-1700kcal/day  Protein:  75-85g/day  Fluid:  1.2-1.4L/day  Koleen Distance MS, RD, LDN Please refer to [facility] for RD and/or RD on-call/weekend/after hours pager

## 2020-06-15 LAB — BASIC METABOLIC PANEL
Anion gap: 4 — ABNORMAL LOW (ref 5–15)
BUN: 8 mg/dL (ref 8–23)
CO2: 25 mmol/L (ref 22–32)
Calcium: 8.1 mg/dL — ABNORMAL LOW (ref 8.9–10.3)
Chloride: 109 mmol/L (ref 98–111)
Creatinine, Ser: 0.83 mg/dL (ref 0.44–1.00)
GFR, Estimated: >60 mL/min (ref >60)
Glucose, Bld: 91 mg/dL (ref 70–99)
Potassium: 3.8 mmol/L (ref 3.5–5.1)
Sodium: 138 mmol/L (ref 135–145)

## 2020-06-15 LAB — CBC
HCT: 35.2 % — ABNORMAL LOW (ref 36.0–46.0)
Hemoglobin: 11.3 g/dL — ABNORMAL LOW (ref 12.0–15.0)
MCH: 28.4 pg (ref 26.0–34.0)
MCHC: 32.1 g/dL (ref 30.0–36.0)
MCV: 88.4 fL (ref 80.0–100.0)
Platelets: 241 10*3/uL (ref 150–400)
RBC: 3.98 MIL/uL (ref 3.87–5.11)
RDW: 13.4 % (ref 11.5–15.5)
WBC: 11 10*3/uL — ABNORMAL HIGH (ref 4.0–10.5)
nRBC: 0 % (ref 0.0–0.2)

## 2020-06-15 MED ORDER — ENSURE ENLIVE PO LIQD
237.0000 mL | Freq: Two times a day (BID) | ORAL | Status: DC
  Administered 2020-06-15 – 2020-06-16 (×2): 237 mL via ORAL

## 2020-06-15 NOTE — Anesthesia Postprocedure Evaluation (Signed)
Anesthesia Post Note  Patient: Nyoka Cowden  Procedure(s) Performed: XI ROBOTIC LAPAROSCOPIC ASSISTED APPENDECTOMY, RIGHT COLECTOMY (Right )  Patient location during evaluation: PACU Anesthesia Type: General Level of consciousness: awake and alert Pain management: pain level controlled Vital Signs Assessment: post-procedure vital signs reviewed and stable Respiratory status: spontaneous breathing, nonlabored ventilation, respiratory function stable and patient connected to nasal cannula oxygen Cardiovascular status: blood pressure returned to baseline and stable Postop Assessment: no apparent nausea or vomiting Anesthetic complications: no   No complications documented.   Last Vitals:  Vitals:   06/15/20 0430 06/15/20 0739  BP: (!) 161/79 (!) 189/83  Pulse: 61 60  Resp: 16 16  Temp: 36.5 C 36.8 C  SpO2: 95% 98%    Last Pain:  Vitals:   06/15/20 0739  TempSrc: Oral  PainSc:                  Arita Miss

## 2020-06-15 NOTE — Progress Notes (Signed)
POD # 2 Doing well + flatus and BM Taking clears AVSS  PE NAD Abd: soft, superficial dehiscence suprapubic incision, no infection, other incisions c/d/i. No peritonitis  A/p Doing well Full liquids Mobilize heplock ivf Daily wet/dry to wound

## 2020-06-15 NOTE — Progress Notes (Addendum)
Order received from Dr Dahlia Byes to change diet to full liquid, discontinue entereg since the patient is having BM's and wet to dry dressing to abdominal surgical dressing. Order also given to discontinue IVF

## 2020-06-16 ENCOUNTER — Encounter: Payer: Self-pay | Admitting: Surgery

## 2020-06-16 MED ORDER — OXYCODONE HCL 5 MG PO TABS: 5.0000 mg | ORAL_TABLET | Freq: Four times a day (QID) | ORAL | 0 refills | Status: DC | PRN

## 2020-06-16 MED ORDER — IBUPROFEN 800 MG PO TABS: 800.0000 mg | ORAL_TABLET | Freq: Three times a day (TID) | ORAL | 0 refills | Status: DC | PRN

## 2020-06-16 NOTE — Discharge Summary (Signed)
[facility] SURGICAL DISCHARGE SUMMARY  Gabrielle Barnes MRN: [id number] DOB/AGE: [birth year] 69 y.o.  Admit date: 06/13/2020 Discharge date: 06/16/2020  Discharge Diagnoses Gabrielle Active Problem List   Diagnosis Date Noted   Mucocele of appendix 05/13/2020    Consultants None  Procedures Robotic assisted right hemicolectomy  HPI: Gabrielle Barnes is a 69 y.o. female with incidentally discovered 11 cm x 5 cm mucocele of the appendix who presents for robotic assisted right hemicolectomy with Dr Christian Mate.   Hospital Course: Informed consent was obtained and documented, and Gabrielle underwent uneventful robotic assisted right hemicolectomy (Dr Christian Mate, 06/13/2020).  Post-operatively, Gabrielle did very well. She did have superficila dehiscence of suprapubic wound which was managed with packing. Advancement of Gabrielle's diet and ambulation were well-tolerated. The remainder of Gabrielle's hospital course was essentially unremarkable, and discharge planning was initiated accordingly with Gabrielle safely able to be discharged home with appropriate discharge instructions, pain control, and outpatient follow-up after all of her and her family's questions were answered to their expressed satisfaction.   Discharge Condition: Good   Physical Examination:  Constitutional: Well appearing female, NAD Pulmonary: Normal effort, no respiratory distress  Gastrointestinal: Soft, incisional soreness, non-distended, non-tender Skin: Suprapubic incision healing via secondary intention, no erythema or drainage. Remaining laparoscopic incisions are CDI with dermabond.    Allergies as of 06/16/2020   No Known Allergies     Medication List    STOP taking these medications   mometasone 0.1 % lotion Commonly known as: ELOCON     TAKE these medications   alendronate 70 MG tablet Commonly known as: FOSAMAX Take 1 tablet (70 mg total) by mouth once a week. Take with a full  glass of water on an empty stomach.   aspirin 81 MG tablet Take 81 tablets by mouth daily.   CALCIUM 600/VITAMIN D3 PO Take 1 tablet by mouth daily.   calcium carbonate 500 MG chewable tablet Commonly known as: TUMS - dosed in mg elemental calcium Chew 1 tablet by mouth daily as needed for indigestion or heartburn.   dorzolamide-timolol 22.3-6.8 MG/ML ophthalmic solution Commonly known as: COSOPT Place 1 drop into both eyes 2 (two) times daily.   famotidine 20 MG tablet Commonly known as: PEPCID TAKE 1 TABLET BY MOUTH TWICE A DAY What changed:   when to take this  reasons to take this   ibuprofen 800 MG tablet Commonly known as: ADVIL Take 1 tablet (800 mg total) by mouth every 8 (eight) hours as needed.   lovastatin 40 MG tablet Commonly known as: MEVACOR TAKE 2 TABLETS BY MOUTH EVERY EVENING   oxyCODONE 5 MG immediate release tablet Commonly known as: Oxy IR/ROXICODONE Take 1 tablet (5 mg total) by mouth every 6 (six) hours as needed for severe pain or breakthrough pain.   traZODone 100 MG tablet Commonly known as: DESYREL TAKE 1 TABLET BY MOUTH EVERY NIGHT AT BEDTIME            Discharge Care Instructions  (From admission, onward)         Start     Ordered   06/16/20 0000  Change dressing (specify)       Comments: Dressing change: 1 times per day using saline moistened gauze, cover with dry gauze, secure.   06/16/20 8768            Follow-up Information    Ronny Bacon, MD. Schedule an appointment as soon as possible for a visit in 2 week(s).  Specialty: General Surgery Why: s/p robotic assisted laparoscopic right colectomy  Contact information: [address] [place] [address] [phone]                Time spent on discharge management including discussion of hospital course, clinical condition, outpatient instructions, prescriptions, and follow up with the Gabrielle and members of the medical team: >30  minutes  -- Edison Simon , PA-C [facility]  06/16/2020, 9:03 AM [phone] M-F: 7am - 4pm

## 2020-06-16 NOTE — Care Management Important Message (Signed)
Important Message  Patient Details  Name: Gabrielle Barnes MRN: [id number] Date of Birth: 1950/10/15   Medicare Important Message Given:  N/A - LOS <3 / Initial given by admissions     Gabrielle Barnes 06/16/2020, 7:50 AM

## 2020-06-16 NOTE — Plan of Care (Signed)

## 2020-06-16 NOTE — Care Plan (Signed)
Pt given AVS at this time. Educated patient and family member about dressing change and s/s of infection. Pt educated about medications upon d/c. Supplies given to patient at this time. Denies questions, IV removed.

## 2020-06-16 NOTE — Discharge Instructions (Signed)
In addition to included general post-operative instructions for robotic assisted laparoscopic right colectomy,  Diet: Resume home diet.   Activity: No heavy lifting >20 pounds (children, pets, laundry, garbage)  For 6 weeks, but light activity and walking are encouraged. Do not drive or drink alcohol if taking narcotic pain medications or having pain that might distract from driving.  Wound care: Continue daily wound packing with saline moistened gauze. Cover with dry gauze and secure with tape or Tegaderm. You may shower/get incision wet with soapy water and pat dry (do not rub incisions), but no baths or submerging incision underwater until follow-up.   Medications: Resume all home medications. For mild to moderate pain: acetaminophen (Tylenol) or ibuprofen/naproxen (if no kidney disease). Combining Tylenol with alcohol can substantially increase your risk of causing liver disease. Narcotic pain medications, if prescribed, can be used for severe pain, though may cause nausea, constipation, and drowsiness. Do not combine Tylenol and Percocet (or similar) within a 6 hour period as Percocet (and similar) contain(s) Tylenol. If you do not need the narcotic pain medication, you do not need to fill the prescription.  Call office [phone] / [phone]) at any time if any questions, worsening pain, fevers/chills, bleeding, drainage from incision site, or other concerns.

## 2020-06-18 ENCOUNTER — Other Ambulatory Visit: Payer: Self-pay | Admitting: Radiology

## 2020-06-18 DIAGNOSIS — N200 Calculus of kidney: Secondary | ICD-10-CM

## 2020-06-19 LAB — SURGICAL PATHOLOGY

## 2020-06-20 ENCOUNTER — Other Ambulatory Visit: Payer: Self-pay | Admitting: Radiology

## 2020-06-20 DIAGNOSIS — N200 Calculus of kidney: Secondary | ICD-10-CM

## 2020-06-23 ENCOUNTER — Other Ambulatory Visit: Payer: Self-pay | Admitting: Family Medicine

## 2020-06-23 DIAGNOSIS — F321 Major depressive disorder, single episode, moderate: Secondary | ICD-10-CM

## 2020-06-23 DIAGNOSIS — F5102 Adjustment insomnia: Secondary | ICD-10-CM

## 2020-06-24 ENCOUNTER — Telehealth: Payer: Self-pay

## 2020-06-24 NOTE — Telephone Encounter (Signed)
Patient's daughter, Magda Paganini, called and states that her mother tested positive for Covid today. She is post op laparoscopic appendectomy, right colectomy on 06/13/20. She was concerned as to what to watch out for. I let her know the biggest risks were breathing problems and blood clots post surgically. The patient has an incentive spirometer and was instructed to use this several times a day. They are aware to report to the ER if any signs of shortness or breath or chest pain.

## 2020-06-30 ENCOUNTER — Encounter: Payer: PPO | Admitting: Physician Assistant

## 2020-07-01 ENCOUNTER — Ambulatory Visit: Payer: Self-pay

## 2020-07-01 ENCOUNTER — Ambulatory Visit: Payer: PPO | Admitting: Urology

## 2020-07-01 MED ORDER — BENZONATATE 100 MG PO CAPS: 100.0000 mg | ORAL_CAPSULE | Freq: Three times a day (TID) | ORAL | 0 refills | Status: DC | PRN

## 2020-07-01 NOTE — Telephone Encounter (Signed)
Pt. Reports she did a home COVID 19 test last Tuesday and it was positive. States she feels a little better. Has a cough,productive with clear mucus, weakness, chills.No other symptoms. Offered a virtual visit. Declines. States she would like Best boy sent to her [employer].States she recently had an appendectomy and "The coughing hurts my incision." Please advise.  Answer Assessment - Initial Assessment Questions 1. COVID-19 DIAGNOSIS: "Who made your Coronavirus (COVID-19) diagnosis?" "Was it confirmed by a positive lab test?" If not diagnosed by a HCP, ask "Are there lots of cases (community spread) where you live?" (See public health department website, if unsure)     Home test 2. COVID-19 EXPOSURE: "Was there any known exposure to COVID before the symptoms began?" CDC Definition of close contact: within 6 feet (2 meters) for a total of 15 minutes or more over a 24-hour period.      No 3. ONSET: "When did the COVID-19 symptoms start?"      Last week - Tuesday 4. WORST SYMPTOM: "What is your worst symptom?" (e.g., cough, fever, shortness of breath, muscle aches)     Cough 5. COUGH: "Do you have a cough?" If Yes, ask: "How bad is the cough?"       Yes 6. FEVER: "Do you have a fever?" If Yes, ask: "What is your temperature, how was it measured, and when did it start?"     Yes 7. RESPIRATORY STATUS: "Describe your breathing?" (e.g., shortness of breath, wheezing, unable to speak)      No 8. BETTER-SAME-WORSE: "Are you getting better, staying the same or getting worse compared to yesterday?"  If getting worse, ask, "In what way?"     Better 9. HIGH RISK DISEASE: "Do you have any chronic medical problems?" (e.g., asthma, heart or lung disease, weak immune system, obesity, etc.)     No 10. PREGNANCY: "Is there any chance you are pregnant?" "When was your last menstrual period?"       No 11. OTHER SYMPTOMS: "Do you have any other symptoms?"  (e.g., chills, fatigue, headache, loss of  smell or taste, muscle pain, sore throat; new loss of smell or taste especially support the diagnosis of COVID-19)       Chills, fever,weak  Protocols used: CORONAVIRUS (COVID-19) DIAGNOSED OR SUSPECTED-A-AH

## 2020-07-01 NOTE — Telephone Encounter (Signed)
Please review. Thanks!  

## 2020-07-02 ENCOUNTER — Encounter: Payer: PPO | Admitting: Physician Assistant

## 2020-07-07 ENCOUNTER — Telehealth: Payer: Self-pay | Admitting: Radiology

## 2020-07-07 DIAGNOSIS — J189 Pneumonia, unspecified organism: Secondary | ICD-10-CM | POA: Diagnosis not present

## 2020-07-07 DIAGNOSIS — R5383 Other fatigue: Secondary | ICD-10-CM | POA: Diagnosis not present

## 2020-07-07 DIAGNOSIS — N200 Calculus of kidney: Secondary | ICD-10-CM | POA: Insufficient documentation

## 2020-07-07 DIAGNOSIS — R058 Other specified cough: Secondary | ICD-10-CM | POA: Diagnosis not present

## 2020-07-07 DIAGNOSIS — E538 Deficiency of other specified B group vitamins: Secondary | ICD-10-CM | POA: Diagnosis not present

## 2020-07-07 DIAGNOSIS — U071 COVID-19: Secondary | ICD-10-CM | POA: Diagnosis not present

## 2020-07-07 DIAGNOSIS — J1282 Pneumonia due to coronavirus disease 2019: Secondary | ICD-10-CM | POA: Diagnosis not present

## 2020-07-07 DIAGNOSIS — R739 Hyperglycemia, unspecified: Secondary | ICD-10-CM | POA: Diagnosis not present

## 2020-07-07 NOTE — Telephone Encounter (Signed)
Daughter called stating patient has COVID pneumonia and needs to postpone surgery for kidney stone. Will follow up in 2 weeks.

## 2020-07-08 ENCOUNTER — Ambulatory Visit: Payer: PPO | Admitting: Urology

## 2020-07-14 ENCOUNTER — Encounter: Payer: PPO | Admitting: Physician Assistant

## 2020-07-15 DIAGNOSIS — E782 Mixed hyperlipidemia: Secondary | ICD-10-CM | POA: Diagnosis not present

## 2020-07-15 DIAGNOSIS — R739 Hyperglycemia, unspecified: Secondary | ICD-10-CM | POA: Diagnosis not present

## 2020-07-15 DIAGNOSIS — R945 Abnormal results of liver function studies: Secondary | ICD-10-CM | POA: Diagnosis not present

## 2020-07-15 DIAGNOSIS — E538 Deficiency of other specified B group vitamins: Secondary | ICD-10-CM | POA: Insufficient documentation

## 2020-07-15 DIAGNOSIS — N3090 Cystitis, unspecified without hematuria: Secondary | ICD-10-CM | POA: Diagnosis not present

## 2020-07-21 ENCOUNTER — Ambulatory Visit: Payer: PPO

## 2020-07-22 ENCOUNTER — Other Ambulatory Visit: Payer: Self-pay | Admitting: Radiology

## 2020-07-22 ENCOUNTER — Other Ambulatory Visit: Payer: Self-pay

## 2020-07-22 ENCOUNTER — Ambulatory Visit (INDEPENDENT_AMBULATORY_CARE_PROVIDER_SITE_OTHER): Payer: PPO | Admitting: Urology

## 2020-07-22 ENCOUNTER — Ambulatory Visit (INDEPENDENT_AMBULATORY_CARE_PROVIDER_SITE_OTHER): Payer: PPO | Admitting: Surgery

## 2020-07-22 ENCOUNTER — Encounter: Payer: Self-pay | Admitting: Surgery

## 2020-07-22 VITALS — BP 115/73 | HR 69 | Temp 98.4°F | Ht 60.0 in | Wt 140.0 lb

## 2020-07-22 DIAGNOSIS — D121 Benign neoplasm of appendix: Secondary | ICD-10-CM

## 2020-07-22 DIAGNOSIS — E782 Mixed hyperlipidemia: Secondary | ICD-10-CM | POA: Diagnosis not present

## 2020-07-22 DIAGNOSIS — R8271 Bacteriuria: Secondary | ICD-10-CM | POA: Diagnosis not present

## 2020-07-22 DIAGNOSIS — N200 Calculus of kidney: Secondary | ICD-10-CM | POA: Diagnosis not present

## 2020-07-22 DIAGNOSIS — K388 Other specified diseases of appendix: Secondary | ICD-10-CM

## 2020-07-22 DIAGNOSIS — N3090 Cystitis, unspecified without hematuria: Secondary | ICD-10-CM | POA: Diagnosis not present

## 2020-07-22 MED ORDER — DIPHENOXYLATE-ATROPINE 2.5-0.025 MG PO TABS: 1.0000 | ORAL_TABLET | Freq: Four times a day (QID) | ORAL | 0 refills | Status: DC | PRN

## 2020-07-22 NOTE — Progress Notes (Signed)
07/22/2020 3:03 PM   Gabrielle Barnes [birth year] [id number]  Referring provider: Birdie Sons, [address] [place],  [place] [address]  Chief Complaint  Patient presents with  . Nephrolithiasis    HPI: 69 year old female with a personal history of bilateral staghorn nephrolithiasis as well as a large mucocele of the appendix who returns today for preoperative counseling.  Since her last visit, she underwent robotic right hemicolectomy with Dr. Christian Mate.  She had a somewhat rocky postoperative course with a wound dehiscence and developed Covid shortly after discharge.  She since recovered from this.  Initially, she is felt to have suspicion for Covid pneumonia but ultimately this was ruled out and her steroids were stopped.  Her breathing is nonlabored and she is not coughing.  She reports that she was also diagnosed with a "really bad" UTI last week by her primary care physician.  Her urine was grossly positive.  Her only symptom was confusion and fevers to 102.  She just completed cefuroxime.  Urine today is frankly positive again consistent with known staghorn renal calculi.  She denies any urinary symptoms or flank pain.  No further fevers.  She is accompanied today by her daughter.   PMH: Past Medical History:  Diagnosis Date  . Depression    after husbands death  . GERD (gastroesophageal reflux disease)   . History of chicken pox   . History of kidney stones   . History of measles   . Migraine     Surgical History: Past Surgical History:  Procedure Laterality Date  . ABDOMINAL HYSTERECTOMY    . CESAREAN SECTION    . colonoscopy    . XI ROBOTIC LAPAROSCOPIC ASSISTED APPENDECTOMY Right 06/13/2020   Procedure: XI ROBOTIC LAPAROSCOPIC ASSISTED APPENDECTOMY, RIGHT COLECTOMY;  Surgeon: Ronny Bacon, MD;  Location: [facility] ORS;  Service: General;  Laterality: Right;    Home Medications:  Allergies as of 07/22/2020   No Known Allergies      Medication List       Accurate as of July 22, 2020  3:03 PM. If you have any questions, ask your nurse or doctor.        STOP taking these medications   benzonatate 100 MG capsule Commonly known as: TESSALON Stopped by: Hollice Espy, MD   oxyCODONE 5 MG immediate release tablet Commonly known as: Oxy IR/ROXICODONE Stopped by: Ronny Bacon, MD     TAKE these medications   alendronate 70 MG tablet Commonly known as: FOSAMAX Take 1 tablet (70 mg total) by mouth once a week. Take with a full glass of water on an empty stomach.   aspirin 81 MG tablet Take 81 tablets by mouth daily.   CALCIUM 600/VITAMIN D3 PO Take 1 tablet by mouth daily.   calcium carbonate 500 MG chewable tablet Commonly known as: TUMS - dosed in mg elemental calcium Chew 1 tablet by mouth daily as needed for indigestion or heartburn.   diphenoxylate-atropine 2.5-0.025 MG tablet Commonly known as: LOMOTIL Take 1 tablet by mouth 4 (four) times daily as needed for diarrhea or loose stools. Started by: Ronny Bacon, MD   dorzolamide-timolol 22.3-6.8 MG/ML ophthalmic solution Commonly known as: COSOPT Place 1 drop into both eyes 2 (two) times daily.   famotidine 20 MG tablet Commonly known as: PEPCID TAKE 1 TABLET BY MOUTH TWICE A DAY What changed:   when to take this  reasons to take this   ibuprofen 800 MG tablet Commonly known as: ADVIL Take  1 tablet (800 mg total) by mouth every 8 (eight) hours as needed.   losartan-hydrochlorothiazide 50-12.5 MG tablet Commonly known as: HYZAAR Take 1 tablet by mouth daily.   lovastatin 40 MG tablet Commonly known as: MEVACOR TAKE 2 TABLETS BY MOUTH EVERY EVENING   traZODone 100 MG tablet Commonly known as: DESYREL TAKE 1 TABLET BY MOUTH EVERY NIGHT AT BEDTIME       Allergies: No Known Allergies  Family History: Family History  Problem Relation Age of Onset  . Dementia Mother   . Stroke Father   . Hypertension Father   . Cancer  Neg Hx   . Diabetes Neg Hx     Social History:  reports that she has never smoked. She has never used smokeless tobacco. She reports that she does not drink alcohol and does not use drugs.   Physical Exam: Constitutional:  Alert and oriented, No acute distress. HEENT: [place] AT, moist mucus membranes.  Trachea midline, no masses. Cardiovascular: No clubbing, cyanosis, or edema. Respiratory: Normal respiratory effort, no increased work of breathing. GI: Abdomen is soft, wounds healing well. Skin: No rashes, bruises or suspicious lesions. Neurologic: Grossly intact, no focal deficits, moving all 4 extremities. Psychiatric: Normal mood and affect.  Laboratory Data: Lab Results  Component Value Date   WBC 11.0 (H) 06/15/2020   HGB 11.3 (L) 06/15/2020   HCT 35.2 (L) 06/15/2020   MCV 88.4 06/15/2020   PLT 241 06/15/2020    Lab Results  Component Value Date   CREATININE 0.83 06/15/2020    Urinalysis UA today is frankly positive for nitrates, white blood cells, red blood cells, many bacteria  Pertinent Imaging:  Assessment & Plan:    1. Staghorn calculus Bilateral staghorn calculi without ureteral obstruction  As per previous conversation, will plan for staged PCNL, starting in the left side.  We reviewed the risks again with this which including risk of bleeding, infection, damage surrounding structures, transfusion risk, need for multiple procedures.  Anticipate she will in fact likely need staged procedures bilaterally.  She understands this.  We discussed the postoperative course including at least overnight admission, drain, wound care and other expectations.  All questions were answered today.  Preoperative UA/urine culture today. - Urinalysis, Complete - CULTURE, URINE COMPREHENSIVE  2. Chronic bacteriuria Recurrent "UTIs" with Proteus likely secondary to colonization from bilateral staghorn calculi, do not suspect acute cystitis today  Unclear whether episode last  week was related to true cystitis or as above.  We discussed the the risk associated with PCNL including the risk of sepsis.  We will plan to pretreat her with appropriate antibiotics starting 3 days before the procedure as well as cover her intraoperatively with dual antibiotics to try to reduce her bacterial load.  She is agreeable this plan.   Hollice Espy, MD  [facility] [address] [place], [place] [address] [phone]

## 2020-07-22 NOTE — Patient Instructions (Addendum)
You should take a bulking fiber supplement like Metamucil or Citrucel daily. Fiber helps hold water. Also be sure to drink plenty of water. We have sent you in a prescription for Lomotil. You may use this if the frequency of bowel movements is bothersome.   Call us if you do not show improvement with your bowel movements in about 1-2 months.   Follow up in 3 months.   GENERAL POST-OPERATIVE PATIENT INSTRUCTIONS   WOUND CARE INSTRUCTIONS: Try to keep the wound dry and avoid ointments on the wound unless directed to do so.  If the wound becomes bright red and painful or starts to drain infected material that is not clear, please contact your physician immediately.  If the wound is mildly pink and has a thick firm ridge underneath it, this is normal, and is referred to as a healing ridge.  This will resolve over the next 4-6 weeks.  BATHING: You may shower if you have been informed of this by your surgeon. However, Please do not submerge in a tub, hot tub, or pool until incisions are completely sealed or have been told by your surgeon that you may do so.  DIET:  You may eat any foods that you can tolerate.  It is a good idea to eat a high fiber diet and take in plenty of fluids to prevent constipation.  If you do become constipated you may want to take a mild laxative or take ducolax tablets on a daily basis until your bowel habits are regular.  Constipation can be very uncomfortable, along with straining, after recent surgery.  ACTIVITY:  You may want to hug a pillow when coughing and sneezing to add additional support to the surgical area, if you had abdominal or chest surgery, which will decrease pain during these times.  You are encouraged to walk and engage in light activity for the next two weeks.  You should not lift more than 20 pounds for 4 weeks after surgery as it could put you at increased risk for complications. Twenty pounds is roughly equivalent to a plastic bag of groceries. At that  time- Listen to your body when lifting, if you have pain when lifting, stop and then try again in a few days. Soreness after doing exercises or activities of daily living is normal as you get back in to your normal routine.  MEDICATIONS:  Try to take narcotic medications and anti-inflammatory medications, such as tylenol, ibuprofen, naprosyn, etc., with food.  This will minimize stomach upset from the medication.  Should you develop nausea and vomiting from the pain medication, or develop a rash, please discontinue the medication and contact your physician.  You should not drive, make important decisions, or operate machinery when taking narcotic pain medication.  SUNBLOCK Use sun block to incision area over the next year if this area will be exposed to sun. This helps decrease scarring and will allow you avoid a permanent darkened area over your incision.  QUESTIONS:  Please feel free to call our office if you have any questions, and we will be glad to assist you.

## 2020-07-22 NOTE — H&P (View-Only) (Signed)
07/22/2020 3:03 PM   Gabrielle Barnes 1950/10/16 [id number]  Referring provider: Birdie Sons, [place],  [place] [address]  Chief Complaint  Patient presents with  . Nephrolithiasis    HPI: 69 year old female with a personal history of bilateral staghorn nephrolithiasis as well as a large mucocele of the appendix who returns today for preoperative counseling.  Since her last visit, she underwent robotic right hemicolectomy with Dr. Christian Mate.  She had a somewhat rocky postoperative course with a wound dehiscence and developed Covid shortly after discharge.  She since recovered from this.  Initially, she is felt to have suspicion for Covid pneumonia but ultimately this was ruled out and her steroids were stopped.  Her breathing is nonlabored and she is not coughing.  She reports that she was also diagnosed with a "really bad" UTI last week by her primary care physician.  Her urine was grossly positive.  Her only symptom was confusion and fevers to 102.  She just completed cefuroxime.  Urine today is frankly positive again consistent with known staghorn renal calculi.  She denies any urinary symptoms or flank pain.  No further fevers.  She is accompanied today by her daughter.   PMH: Past Medical History:  Diagnosis Date  . Depression    after husbands death  . GERD (gastroesophageal reflux disease)   . History of chicken pox   . History of kidney stones   . History of measles   . Migraine     Surgical History: Past Surgical History:  Procedure Laterality Date  . ABDOMINAL HYSTERECTOMY    . CESAREAN SECTION    . colonoscopy    . XI ROBOTIC LAPAROSCOPIC ASSISTED APPENDECTOMY Right 06/13/2020   Procedure: XI ROBOTIC LAPAROSCOPIC ASSISTED APPENDECTOMY, RIGHT COLECTOMY;  Surgeon: Ronny Bacon, MD;  Location: [facility] ORS;  Service: General;  Laterality: Right;    Home Medications:  Allergies as of 07/22/2020   No Known Allergies      Medication List       Accurate as of July 22, 2020  3:03 PM. If you have any questions, ask your nurse or doctor.        STOP taking these medications   benzonatate 100 MG capsule Commonly known as: TESSALON Stopped by: Hollice Espy, MD   oxyCODONE 5 MG immediate release tablet Commonly known as: Oxy IR/ROXICODONE Stopped by: Ronny Bacon, MD     TAKE these medications   alendronate 70 MG tablet Commonly known as: FOSAMAX Take 1 tablet (70 mg total) by mouth once a week. Take with a full glass of water on an empty stomach.   aspirin 81 MG tablet Take 81 tablets by mouth daily.   CALCIUM 600/VITAMIN D3 PO Take 1 tablet by mouth daily.   calcium carbonate 500 MG chewable tablet Commonly known as: TUMS - dosed in mg elemental calcium Chew 1 tablet by mouth daily as needed for indigestion or heartburn.   diphenoxylate-atropine 2.5-0.025 MG tablet Commonly known as: LOMOTIL Take 1 tablet by mouth 4 (four) times daily as needed for diarrhea or loose stools. Started by: Ronny Bacon, MD   dorzolamide-timolol 22.3-6.8 MG/ML ophthalmic solution Commonly known as: COSOPT Place 1 drop into both eyes 2 (two) times daily.   famotidine 20 MG tablet Commonly known as: PEPCID TAKE 1 TABLET BY MOUTH TWICE A DAY What changed:   when to take this  reasons to take this   ibuprofen 800 MG tablet Commonly known as: ADVIL Take  1 tablet (800 mg total) by mouth every 8 (eight) hours as needed.   losartan-hydrochlorothiazide 50-12.5 MG tablet Commonly known as: HYZAAR Take 1 tablet by mouth daily.   lovastatin 40 MG tablet Commonly known as: MEVACOR TAKE 2 TABLETS BY MOUTH EVERY EVENING   traZODone 100 MG tablet Commonly known as: DESYREL TAKE 1 TABLET BY MOUTH EVERY NIGHT AT BEDTIME       Allergies: No Known Allergies  Family History: Family History  Problem Relation Age of Onset  . Dementia Mother   . Stroke Father   . Hypertension Father   . Cancer  Neg Hx   . Diabetes Neg Hx     Social History:  reports that she has never smoked. She has never used smokeless tobacco. She reports that she does not drink alcohol and does not use drugs.   Physical Exam: Constitutional:  Alert and oriented, No acute distress. HEENT: [place] AT, moist mucus membranes.  Trachea midline, no masses. Cardiovascular: No clubbing, cyanosis, or edema. Respiratory: Normal respiratory effort, no increased work of breathing. GI: Abdomen is soft, wounds healing well. Skin: No rashes, bruises or suspicious lesions. Neurologic: Grossly intact, no focal deficits, moving all 4 extremities. Psychiatric: Normal mood and affect.  Laboratory Data: Lab Results  Component Value Date   WBC 11.0 (H) 06/15/2020   HGB 11.3 (L) 06/15/2020   HCT 35.2 (L) 06/15/2020   MCV 88.4 06/15/2020   PLT 241 06/15/2020    Lab Results  Component Value Date   CREATININE 0.83 06/15/2020    Urinalysis UA today is frankly positive for nitrates, white blood cells, red blood cells, many bacteria  Pertinent Imaging:  Assessment & Plan:    1. Staghorn calculus Bilateral staghorn calculi without ureteral obstruction  As per previous conversation, will plan for staged PCNL, starting in the left side.  We reviewed the risks again with this which including risk of bleeding, infection, damage surrounding structures, transfusion risk, need for multiple procedures.  Anticipate she will in fact likely need staged procedures bilaterally.  She understands this.  We discussed the postoperative course including at least overnight admission, drain, wound care and other expectations.  All questions were answered today.  Preoperative UA/urine culture today. - Urinalysis, Complete - CULTURE, URINE COMPREHENSIVE  2. Chronic bacteriuria Recurrent "UTIs" with Proteus likely secondary to colonization from bilateral staghorn calculi, do not suspect acute cystitis today  Unclear whether episode last  week was related to true cystitis or as above.  We discussed the the risk associated with PCNL including the risk of sepsis.  We will plan to pretreat her with appropriate antibiotics starting 3 days before the procedure as well as cover her intraoperatively with dual antibiotics to try to reduce her bacterial load.  She is agreeable this plan.   Hollice Espy, MD  [facility] [address] [place], [place] [address] [phone]

## 2020-07-22 NOTE — Progress Notes (Signed)
[facility] POST-OP OFFICE VISIT  07/22/2020  HPI: Gabrielle Barnes is a 69 y.o. 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  Biggest concerns today remains with frequency of bowel movements.  She reports multiple movements daily.  Denies seeing any undigested food within the movement.  Reports the usual brown-green coloration.  Depending upon what she eats she may have some associated abdominal discomfort.  She has not utilized anything over-the-counter, they blamed frozen dinners.  She denies fevers and chills.  Denies seeing any blood in the stool.  Vital signs: BP 115/73   Pulse 69   Temp 98.4 F (36.9 C)   Ht 5' (1.524 m)   Wt 140 lb (63.5 kg)   SpO2 97%   BMI 27.34 kg/m    Physical Exam: Constitutional: She appears well.  She holds her head up while evaluating her abdomen. Abdomen: Benign, soft nontender.  Dry crust I peeled off the extraction site.  All fully healed without evidence of masses or fascial defect. Skin: Intact.  PATH:  SURGICAL PATHOLOGY  CASE: [id number]  PATIENT: Gabrielle Barnes  Surgical Pathology Report      Specimen Submitted:  A. Right colon   Clinical History: Mucocele of appendix on CT for nephrolithiasis.  Operative findings: large tubular appendiceal mass, no other  intraperitoneal findings of suspicion.      DIAGNOSIS:  A. TERMINAL ILEUM, APPENDIX, AND RIGHT COLON; RIGHT COLECTOMY:  - LOW-GRADE APPENDICEAL MUCINOUS NEOPLASM (LAMN), SEE SUMMARY BELOW.   CASE SUMMARY: (APPENDIX: Resection)  Standard(s): AJCC-UICC 8   SPECIMEN  Procedure: Appendectomy and right colectomy   TUMOR  Tumor site: Diffusely involving appendix  Histologic type: Low-grade appendiceal mucinous neoplasm (LAMN)  Histologic Grade: Well-differentiated (G1)  Tumor Size: Greatest dimension: 10.3 cm  Tumor Deposits: Not identified  Tumor Extent: Acellular mucin invades subserosa but  does not extend to  the serosal surface  Lymphovascular Invasion: Not identified   MARGINS  Margin status for Non-invasive Tumor and Mucin:    All margins negative for noninvasive tumor and mucin   REGIONAL LYMPH NODES  Regional Lymph Node Status: All regional lymph nodes negative for tumor     Number of lymph nodes examined: 10   DISTANT METASTASIS  Distant Site(s) Involved, if applicable: Not applicable   PATHOLOGIC STAGE CLASSIFICATION (pTNM, AJCC 8th Edition):  TNM Descriptors: Not applicable  pT3  pN0  pM - Not applicable   Comment:  The small appendiceal defect identified on gross examination appears to  correlate with description of a hole created at the time of surgery,  with no spillage reported. No extra-appendiceal mucin was identified on  gross or microscopic examination.   Assessment/Plan: This is a 69 y.o. female 40 days s/p robotic right hemicolectomy.  Patient Active Problem List   Diagnosis Date Noted  . 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 Microhematuria 04/20/2020  . Osteoporosis 12/01/2016  . Allergic rhinitis 06/17/2015  . Depression 06/17/2015  . Glaucoma 06/17/2015  . 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 Headache 06/17/2015  . HLD (hyperlipidemia) 06/17/2015  . Insomnia 06/17/2015    -She has urological surgery planned.  In the interim we will try to address her diarrhea with Lomotil, Pepto-Bismol, fiber supplementation and what ever seems to work well for her. She reports she consumes lots of water and stays well-hydrated. We will trial these things get her through her next operation and see what kind of progress  she makes over the next 2 months.   Ronny Bacon M.D., FACS 07/22/2020, 12:15 PM

## 2020-07-23 ENCOUNTER — Other Ambulatory Visit: Payer: Self-pay | Admitting: Radiology

## 2020-07-23 DIAGNOSIS — N200 Calculus of kidney: Secondary | ICD-10-CM

## 2020-07-23 LAB — MICROSCOPIC EXAMINATION: WBC, UA: >30 /hpf — AB (ref 0–5)

## 2020-07-23 LAB — URINALYSIS, COMPLETE
Bilirubin, UA: NEGATIVE
Glucose, UA: NEGATIVE
Ketones, UA: NEGATIVE
Nitrite, UA: POSITIVE — AB
Specific Gravity, UA: 1.015 (ref 1.005–1.030)
Urobilinogen, Ur: 0.2 mg/dL (ref 0.2–1.0)
pH, UA: 7 (ref 5.0–7.5)

## 2020-07-25 ENCOUNTER — Other Ambulatory Visit: Payer: Self-pay | Admitting: Radiology

## 2020-07-25 ENCOUNTER — Telehealth: Payer: Self-pay | Admitting: Radiology

## 2020-07-25 MED ORDER — SULFAMETHOXAZOLE-TRIMETHOPRIM 800-160 MG PO TABS: 1.0000 | ORAL_TABLET | Freq: Two times a day (BID) | ORAL | 0 refills | Status: DC

## 2020-07-25 NOTE — Telephone Encounter (Signed)
LMOM that script was sent to pharmacy & preoperative antibiotics changed as requested by Dr Erlene Quan.

## 2020-07-25 NOTE — Telephone Encounter (Signed)
-----   Message from Hollice Espy, MD sent at 07/24/2020  8:10 AM EST ----- I think no need to repeat culture.  Yes, send bactrim to start 3 days before surgery for 3 days twice daily (6 tabs).  Hollice Espy, MD  ----- Message ----- From: Ranell Patrick, RN Sent: 07/23/2020   1:15 PM EST To: Ranell Patrick, RN, Hollice Espy, MD  Just to be sure, do you want to wait for the ucx from yesterday to result before I send in the Bactrim? It's nitrite positive. How many total days does she need to take? Her surgery is on 1/10. I had asked her to come in on 12/30 for a ua & ucx.  ----- Message ----- From: Hollice Espy, MD Sent: 07/22/2020   3:10 PM EST To: Ranell Patrick, RN  I would like to change her perioperative antibiotics.  Like to treat her with Bactrim DS starting 3 days before the procedure twice daily.  Intraoperatively, would like her to receive gentamicin with dosing by the pharmacy and ampicillin 1 g.

## 2020-08-01 LAB — CULTURE, URINE COMPREHENSIVE

## 2020-08-04 ENCOUNTER — Other Ambulatory Visit: Payer: Self-pay | Admitting: Family Medicine

## 2020-08-04 DIAGNOSIS — N200 Calculus of kidney: Secondary | ICD-10-CM

## 2020-08-07 ENCOUNTER — Other Ambulatory Visit: Payer: PPO

## 2020-08-07 ENCOUNTER — Other Ambulatory Visit: Payer: Self-pay

## 2020-08-07 ENCOUNTER — Encounter
Admission: RE | Admit: 2020-08-07 | Discharge: 2020-08-07 | Disposition: A | Discharge status: Home / Self Care | Payer: PPO | Source: Ambulatory Visit | Attending: Urology | Admitting: Urology

## 2020-08-07 DIAGNOSIS — N200 Calculus of kidney: Secondary | ICD-10-CM | POA: Diagnosis not present

## 2020-08-07 DIAGNOSIS — Z01812 Encounter for preprocedural laboratory examination: Secondary | ICD-10-CM | POA: Insufficient documentation

## 2020-08-07 LAB — BASIC METABOLIC PANEL
Anion gap: 11 (ref 5–15)
BUN: 17 mg/dL (ref 8–23)
CO2: 29 mmol/L (ref 22–32)
Calcium: 10 mg/dL (ref 8.9–10.3)
Chloride: 97 mmol/L — ABNORMAL LOW (ref 98–111)
Creatinine, Ser: 1.08 mg/dL — ABNORMAL HIGH (ref 0.44–1.00)
GFR, Estimated: 56 mL/min — ABNORMAL LOW (ref >60)
Glucose, Bld: 114 mg/dL — ABNORMAL HIGH (ref 70–99)
Potassium: 3.5 mmol/L (ref 3.5–5.1)
Sodium: 137 mmol/L (ref 135–145)

## 2020-08-07 LAB — APTT: aPTT: 33 seconds (ref 24–36)

## 2020-08-07 LAB — CBC
HCT: 40.4 % (ref 36.0–46.0)
Hemoglobin: 13.2 g/dL (ref 12.0–15.0)
MCH: 28 pg (ref 26.0–34.0)
MCHC: 32.7 g/dL (ref 30.0–36.0)
MCV: 85.6 fL (ref 80.0–100.0)
Platelets: 335 10*3/uL (ref 150–400)
RBC: 4.72 MIL/uL (ref 3.87–5.11)
RDW: 14.6 % (ref 11.5–15.5)
WBC: 9.8 10*3/uL (ref 4.0–10.5)
nRBC: 0 % (ref 0.0–0.2)

## 2020-08-07 LAB — TYPE AND SCREEN
ABO/RH(D): O POS
Antibody Screen: NEGATIVE

## 2020-08-07 LAB — URINALYSIS, COMPLETE
Bilirubin, UA: NEGATIVE
Glucose, UA: NEGATIVE
Ketones, UA: NEGATIVE
Nitrite, UA: POSITIVE — AB
Specific Gravity, UA: 1.015 (ref 1.005–1.030)
Urobilinogen, Ur: 0.2 mg/dL (ref 0.2–1.0)
pH, UA: 7.5 (ref 5.0–7.5)

## 2020-08-07 LAB — MICROSCOPIC EXAMINATION: WBC, UA: >30 /hpf — AB (ref 0–5)

## 2020-08-07 LAB — PROTIME-INR
INR: 0.9 (ref 0.8–1.2)
Prothrombin Time: 11.3 seconds — ABNORMAL LOW (ref 11.4–15.2)

## 2020-08-07 NOTE — Progress Notes (Signed)
Per Sam urine culture was cancelled today, this drop off was an error and was to be cancelled previously. Antibiotics have previously been called in for patient, culture done prior

## 2020-08-07 NOTE — Patient Instructions (Signed)
Your procedure is scheduled on:08-18-20 MONDAY Report to the Registration Desk on the 1st floor of the [facility]-Then proceed to the 2nd floor Surgery Desk in the [facility] To find out your arrival time, please call [phone] between 1PM - 3PM on: 08-15-20 FRIDAY  REMEMBER: Instructions that are not followed completely may result in serious medical risk, up to and including death; or upon the discretion of your surgeon and anesthesiologist your surgery may need to be rescheduled.  Do not eat food after midnight the night before surgery.  No gum chewing, lozengers or hard candies.  You may however, drink CLEAR liquids up to 2 hours before you are scheduled to arrive for your surgery. Do not drink anything within 2 hours of your scheduled arrival time.  Clear liquids include: - water  - apple juice without pulp - gatorade  - black coffee or tea (Do NOT add milk or creamers to the coffee or tea) Do NOT drink anything that is not on this list.  TAKE THESE MEDICATIONS THE MORNING OF SURGERY WITH A SIP OF WATER: -PEPCID (FAMOTIDINE)-take one the night before and one on the morning of surgery - helps to prevent nausea after surgery.)  Follow recommendations from Cardiologist, Pulmonologist or PCP regarding stopping Aspirin, Coumadin, Plavix, Eliquis, Pradaxa, or Pletal-ASPIRIN WAS ALREADY STOPPED WEEKS AGO  One week prior to surgery: Stop Anti-inflammatories (NSAIDS) such as Advil, Aleve, Ibuprofen, Motrin, Naproxen, Naprosyn and Aspirin based products such as Excedrin, Goodys Powder, BC Powder-OK TO TAKE TYLENOL IF NEEDED  Stop ANY OVER THE COUNTER supplements until after surgery. (However, you may continue taking Calcium-Vitamin D up until the day before surgery.)  No Alcohol for 24 hours before or after surgery.  No Smoking including e-cigarettes for 24 hours prior to surgery.  No chewable tobacco products for at least 6 hours prior to surgery.  No nicotine patches on the day of  surgery.  Do not use any "recreational" drugs for at least a week prior to your surgery.  Please be advised that the combination of cocaine and anesthesia may have negative outcomes, up to and including death. If you test positive for cocaine, your surgery will be cancelled.  On the morning of surgery brush your teeth with toothpaste and water, you may rinse your mouth with mouthwash if you wish. Do not swallow any toothpaste or mouthwash.  Do not wear jewelry, make-up, hairpins, clips or nail polish.  Do not wear lotions, powders, or perfumes.   Do not shave body from the neck down 48 hours prior to surgery just in case you cut yourself which could leave a site for infection.  Also, freshly shaved skin may become irritated if using the CHG soap.  Contact lenses, hearing aids and dentures may not be worn into surgery.  Do not bring valuables to the hospital. [facility] is not responsible for any missing/lost belongings or valuables.   Use CHG Soap as directed on instruction sheet  Notify your doctor if there is any change in your medical condition (cold, fever, infection).  Wear comfortable clothing (specific to your surgery type) to the hospital.  Plan for stool softeners for home use; pain medications have a tendency to cause constipation. You can also help prevent constipation by eating foods high in fiber such as fruits and vegetables and drinking plenty of fluids as your diet allows.  After surgery, you can help prevent lung complications by doing breathing exercises.  Take deep breaths and cough every 1-2 hours.  Your doctor may order a device called an Incentive Spirometer to help you take deep breaths. When coughing or sneezing, hold a pillow firmly against your incision with both hands. This is called "splinting." Doing this helps protect your incision. It also decreases belly discomfort.  If you are being admitted to the hospital overnight, leave your suitcase in the  car. After surgery it may be brought to your room.  If you are being discharged the day of surgery, you will not be allowed to drive home. You will need a responsible adult (18 years or older) to drive you home and stay with you that night.   If you are taking public transportation, you will need to have a responsible adult (18 years or older) with you. Please confirm with your physician that it is acceptable to use public transportation.   Please call the Pre-admissions Testing Dept. at [phone] if you have any questions about these instructions.  Visitation Policy:  Patients undergoing a surgery or procedure may have one family member or support person with them as long as that person is not COVID-19 positive or experiencing its symptoms.  That person may remain in the waiting area during the procedure.  Inpatient Visitation Update:   In an effort to ensure the safety of our team members and our patients, we are implementing a change to our visitation policy:  Effective Monday, Aug. 9, at 7 a.m., inpatients will be allowed one support person.  o The support person may change daily.  o The support person must pass our screening, gel in and out, and wear a mask at all times, including in the patient's room.  o Patients must also wear a mask when staff or their support person are in the room.  o Masking is required regardless of vaccination status.  Systemwide, no visitors 17 or younger.

## 2020-08-12 NOTE — Progress Notes (Signed)
Patient on schedule for PCNL 08/18/2020, called and spoke with patient on phone. Made aware to be here @ 0630 for SDS to get ready for  Or part of procedure, to [place] @ 0730 for PCNL, then back to SDS for OR procedure. Made aware to be NPO after MN prior to procedures, stated understanding.

## 2020-08-14 ENCOUNTER — Other Ambulatory Visit: Payer: PPO

## 2020-08-15 ENCOUNTER — Other Ambulatory Visit: Payer: Self-pay | Admitting: Radiology

## 2020-08-15 ENCOUNTER — Other Ambulatory Visit: Payer: Self-pay | Admitting: Student

## 2020-08-15 ENCOUNTER — Other Ambulatory Visit
Admission: RE | Admit: 2020-08-15 | Discharge: 2020-08-15 | Disposition: A | Discharge status: Home / Self Care | Payer: PPO | Source: Ambulatory Visit | Attending: Urology | Admitting: Urology

## 2020-08-15 DIAGNOSIS — Z01812 Encounter for preprocedural laboratory examination: Secondary | ICD-10-CM | POA: Diagnosis not present

## 2020-08-15 DIAGNOSIS — Z20822 Contact with and (suspected) exposure to covid-19: Secondary | ICD-10-CM | POA: Insufficient documentation

## 2020-08-16 LAB — SARS CORONAVIRUS 2 (TAT 6-24 HRS): SARS Coronavirus 2: NEGATIVE

## 2020-08-18 ENCOUNTER — Encounter: Payer: Self-pay | Admitting: Urology

## 2020-08-18 ENCOUNTER — Ambulatory Visit: Payer: PPO

## 2020-08-18 ENCOUNTER — Ambulatory Visit
Admission: RE | Admit: 2020-08-18 | Discharge: 2020-08-18 | Disposition: A | Discharge status: Home / Self Care | Payer: PPO | Source: Ambulatory Visit | Attending: Urology | Admitting: Urology

## 2020-08-18 ENCOUNTER — Other Ambulatory Visit: Payer: Self-pay

## 2020-08-18 ENCOUNTER — Encounter
Admission: RE | Disposition: A | Discharge status: Home / Self Care | Payer: Self-pay | Source: Home / Self Care | Attending: Urology

## 2020-08-18 ENCOUNTER — Ambulatory Visit: Payer: PPO | Admitting: Anesthesiology

## 2020-08-18 ENCOUNTER — Observation Stay
Admission: RE | Admit: 2020-08-18 | Discharge: 2020-08-19 | Disposition: A | Discharge status: Home / Self Care | Payer: PPO | Attending: Urology | Admitting: Urology

## 2020-08-18 DIAGNOSIS — N200 Calculus of kidney: Secondary | ICD-10-CM

## 2020-08-18 DIAGNOSIS — N202 Calculus of kidney with calculus of ureter: Principal | ICD-10-CM | POA: Insufficient documentation

## 2020-08-18 DIAGNOSIS — R8271 Bacteriuria: Secondary | ICD-10-CM | POA: Insufficient documentation

## 2020-08-18 HISTORY — PX: NEPHROLITHOTOMY: SHX5134

## 2020-08-18 HISTORY — PX: IR NEPHROSTOMY PLACEMENT LEFT: IMG6063

## 2020-08-18 HISTORY — PX: CYSTOSCOPY WITH HOLMIUM LASER LITHOTRIPSY: SHX6639

## 2020-08-18 HISTORY — DX: Essential (primary) hypertension: I10

## 2020-08-18 SURGERY — NEPHROLITHOTOMY PERCUTANEOUS
Anesthesia: General | Laterality: Left

## 2020-08-18 MED ORDER — FENTANYL CITRATE (PF) 100 MCG/2ML IJ SOLN
INTRAMUSCULAR | Status: AC | PRN
  Administered 2020-08-18: 50 ug via INTRAVENOUS

## 2020-08-18 MED ORDER — SODIUM CHLORIDE 0.9 % IV SOLN: INTRAVENOUS | Status: DC

## 2020-08-18 MED ORDER — OXYBUTYNIN CHLORIDE 5 MG PO TABS: 5.0000 mg | ORAL_TABLET | Freq: Three times a day (TID) | ORAL | Status: DC | PRN

## 2020-08-18 MED ORDER — ACETAMINOPHEN 325 MG PO TABS: 650.0000 mg | ORAL_TABLET | ORAL | Status: DC | PRN

## 2020-08-18 MED ORDER — GENTAMICIN SULFATE 40 MG/ML IJ SOLN
5.0000 mg/kg | Freq: Once | INTRAVENOUS | Status: AC
  Administered 2020-08-18: 320 mg via INTRAVENOUS
  Filled 2020-08-18: qty 8

## 2020-08-18 MED ORDER — EPHEDRINE 5 MG/ML INJ
INTRAVENOUS | Status: AC
  Filled 2020-08-18: qty 10

## 2020-08-18 MED ORDER — IODIXANOL 320 MG/ML IV SOLN: 50.0000 mL | Freq: Once | INTRAVENOUS | Status: DC

## 2020-08-18 MED ORDER — LOSARTAN POTASSIUM 50 MG PO TABS
50.0000 mg | ORAL_TABLET | ORAL | Status: DC
  Filled 2020-08-18: qty 1

## 2020-08-18 MED ORDER — ORAL CARE MOUTH RINSE: 15.0000 mL | Freq: Once | OROMUCOSAL | Status: AC

## 2020-08-18 MED ORDER — OXYCODONE-ACETAMINOPHEN 5-325 MG PO TABS
1.0000 | ORAL_TABLET | ORAL | Status: DC | PRN
  Administered 2020-08-18: 1 via ORAL

## 2020-08-18 MED ORDER — DIPHENHYDRAMINE HCL 50 MG/ML IJ SOLN: 12.5000 mg | Freq: Four times a day (QID) | INTRAMUSCULAR | Status: DC | PRN

## 2020-08-18 MED ORDER — FENTANYL CITRATE (PF) 100 MCG/2ML IJ SOLN
INTRAMUSCULAR | Status: AC
  Filled 2020-08-18: qty 2

## 2020-08-18 MED ORDER — HYDROCHLOROTHIAZIDE 25 MG PO TABS
12.5000 mg | ORAL_TABLET | Freq: Every day | ORAL | Status: DC
  Administered 2020-08-19: 12.5 mg via ORAL
  Filled 2020-08-18 (×2): qty 0.5

## 2020-08-18 MED ORDER — OXYCODONE-ACETAMINOPHEN 5-325 MG PO TABS
ORAL_TABLET | ORAL | Status: AC
  Administered 2020-08-18: 2 via ORAL
  Filled 2020-08-18: qty 2

## 2020-08-18 MED ORDER — OXYCODONE-ACETAMINOPHEN 5-325 MG PO TABS
ORAL_TABLET | ORAL | Status: AC
  Filled 2020-08-18: qty 1

## 2020-08-18 MED ORDER — KETAMINE HCL 10 MG/ML IJ SOLN
INTRAMUSCULAR | Status: DC | PRN
  Administered 2020-08-18 (×2): 10 mg via INTRAVENOUS
  Administered 2020-08-18 (×2): 20 mg via INTRAVENOUS

## 2020-08-18 MED ORDER — ONDANSETRON HCL 4 MG/2ML IJ SOLN
INTRAMUSCULAR | Status: AC
  Filled 2020-08-18: qty 2

## 2020-08-18 MED ORDER — LIDOCAINE HCL (PF) 2 % IJ SOLN
INTRAMUSCULAR | Status: AC
  Filled 2020-08-18: qty 5

## 2020-08-18 MED ORDER — ROCURONIUM BROMIDE 10 MG/ML (PF) SYRINGE
PREFILLED_SYRINGE | INTRAVENOUS | Status: AC
  Filled 2020-08-18: qty 10

## 2020-08-18 MED ORDER — MIDAZOLAM HCL 5 MG/5ML IJ SOLN
INTRAMUSCULAR | Status: AC
  Filled 2020-08-18: qty 5

## 2020-08-18 MED ORDER — LOSARTAN POTASSIUM 50 MG PO TABS
50.0000 mg | ORAL_TABLET | Freq: Every day | ORAL | Status: DC
Start: 2020-08-19 — End: 2020-08-18

## 2020-08-18 MED ORDER — HEPARIN SODIUM (PORCINE) 5000 UNIT/ML IJ SOLN: 5000.0000 [IU] | Freq: Three times a day (TID) | INTRAMUSCULAR | Status: DC

## 2020-08-18 MED ORDER — SODIUM CHLORIDE 0.9 % IV SOLN
1.0000 g | INTRAVENOUS | Status: AC
  Administered 2020-08-18: 1 g via INTRAVENOUS
  Filled 2020-08-18 (×2): qty 1

## 2020-08-18 MED ORDER — EPHEDRINE SULFATE 50 MG/ML IJ SOLN
INTRAMUSCULAR | Status: DC | PRN
  Administered 2020-08-18: 10 mg via INTRAVENOUS
  Administered 2020-08-18: 5 mg via INTRAVENOUS

## 2020-08-18 MED ORDER — LOSARTAN POTASSIUM 50 MG PO TABS
50.0000 mg | ORAL_TABLET | Freq: Every day | ORAL | Status: DC
  Administered 2020-08-19: 50 mg via ORAL
  Filled 2020-08-18 (×2): qty 1

## 2020-08-18 MED ORDER — FENTANYL CITRATE (PF) 100 MCG/2ML IJ SOLN: 25.0000 ug | INTRAMUSCULAR | Status: DC | PRN

## 2020-08-18 MED ORDER — OXYBUTYNIN CHLORIDE 5 MG PO TABS
ORAL_TABLET | ORAL | Status: AC
  Administered 2020-08-18: 5 mg via ORAL
  Filled 2020-08-18: qty 1

## 2020-08-18 MED ORDER — SODIUM CHLORIDE 0.9 % IV SOLN
1.0000 g | INTRAVENOUS | Status: DC
  Filled 2020-08-18: qty 1

## 2020-08-18 MED ORDER — PHENYLEPHRINE HCL (PRESSORS) 10 MG/ML IV SOLN
INTRAVENOUS | Status: DC | PRN
  Administered 2020-08-18: 50 ug via INTRAVENOUS
  Administered 2020-08-18: 150 ug via INTRAVENOUS
  Administered 2020-08-18: 200 ug via INTRAVENOUS
  Administered 2020-08-18 (×4): 50 ug via INTRAVENOUS
  Administered 2020-08-18: 200 ug via INTRAVENOUS
  Administered 2020-08-18 (×2): 50 ug via INTRAVENOUS

## 2020-08-18 MED ORDER — HYDROCHLOROTHIAZIDE 12.5 MG PO CAPS: 12.5000 mg | ORAL_CAPSULE | ORAL | Status: DC

## 2020-08-18 MED ORDER — LOSARTAN POTASSIUM 50 MG PO TABS
50.0000 mg | ORAL_TABLET | ORAL | Status: DC
Start: 2020-08-19 — End: 2020-08-18

## 2020-08-18 MED ORDER — LACTATED RINGERS IV SOLN
INTRAVENOUS | Status: DC
  Administered 2020-08-18: 50 mL/h via INTRAVENOUS

## 2020-08-18 MED ORDER — LOSARTAN POTASSIUM-HCTZ 50-12.5 MG PO TABS: 1.0000 | ORAL_TABLET | ORAL | Status: DC

## 2020-08-18 MED ORDER — CHLORHEXIDINE GLUCONATE 0.12 % MT SOLN
15.0000 mL | Freq: Once | OROMUCOSAL | Status: AC
  Administered 2020-08-18: 15 mL via OROMUCOSAL

## 2020-08-18 MED ORDER — ROCURONIUM BROMIDE 100 MG/10ML IV SOLN
INTRAVENOUS | Status: DC | PRN
  Administered 2020-08-18: 50 mg via INTRAVENOUS
  Administered 2020-08-18 (×2): 10 mg via INTRAVENOUS

## 2020-08-18 MED ORDER — MIDAZOLAM HCL 5 MG/5ML IJ SOLN
INTRAMUSCULAR | Status: AC | PRN
  Administered 2020-08-18 (×2): 1 mg via INTRAVENOUS

## 2020-08-18 MED ORDER — HEPARIN SODIUM (PORCINE) 5000 UNIT/ML IJ SOLN
INTRAMUSCULAR | Status: AC
  Administered 2020-08-18: 5000 [IU] via SUBCUTANEOUS
  Filled 2020-08-18: qty 1

## 2020-08-18 MED ORDER — MORPHINE SULFATE (PF) 4 MG/ML IV SOLN: 2.0000 mg | INTRAVENOUS | Status: DC | PRN

## 2020-08-18 MED ORDER — PRAVASTATIN SODIUM 40 MG PO TABS
40.0000 mg | ORAL_TABLET | Freq: Every day | ORAL | Status: DC
  Administered 2020-08-18: 40 mg via ORAL
  Filled 2020-08-18 (×2): qty 1

## 2020-08-18 MED ORDER — CHLORHEXIDINE GLUCONATE 0.12 % MT SOLN
OROMUCOSAL | Status: AC
  Filled 2020-08-18: qty 15

## 2020-08-18 MED ORDER — FENTANYL CITRATE (PF) 100 MCG/2ML IJ SOLN
INTRAMUSCULAR | Status: DC | PRN
  Administered 2020-08-18: 25 ug via INTRAVENOUS
  Administered 2020-08-18: 50 ug via INTRAVENOUS

## 2020-08-18 MED ORDER — HYDROCHLOROTHIAZIDE 12.5 MG PO CAPS
12.5000 mg | ORAL_CAPSULE | ORAL | Status: DC
  Filled 2020-08-18: qty 1

## 2020-08-18 MED ORDER — KETAMINE HCL 50 MG/5ML IJ SOSY
PREFILLED_SYRINGE | INTRAMUSCULAR | Status: AC
  Filled 2020-08-18: qty 5

## 2020-08-18 MED ORDER — IOHEXOL 180 MG/ML  SOLN
INTRAMUSCULAR | Status: DC | PRN
  Administered 2020-08-18: 100 mL

## 2020-08-18 MED ORDER — PROPOFOL 10 MG/ML IV BOLUS
INTRAVENOUS | Status: DC | PRN
  Administered 2020-08-18: 100 mg via INTRAVENOUS

## 2020-08-18 MED ORDER — FAMOTIDINE 20 MG PO TABS: 40.0000 mg | ORAL_TABLET | Freq: Every day | ORAL | Status: DC | PRN

## 2020-08-18 MED ORDER — DIPHENHYDRAMINE HCL 12.5 MG/5ML PO ELIX
12.5000 mg | ORAL_SOLUTION | Freq: Four times a day (QID) | ORAL | Status: DC | PRN
  Filled 2020-08-18: qty 5

## 2020-08-18 MED ORDER — DEXAMETHASONE SODIUM PHOSPHATE 10 MG/ML IJ SOLN
INTRAMUSCULAR | Status: AC
  Filled 2020-08-18: qty 1

## 2020-08-18 MED ORDER — DOCUSATE SODIUM 100 MG PO CAPS
100.0000 mg | ORAL_CAPSULE | Freq: Two times a day (BID) | ORAL | Status: DC
  Administered 2020-08-18 – 2020-08-19 (×2): 100 mg via ORAL
  Filled 2020-08-18 (×3): qty 1

## 2020-08-18 MED ORDER — ACETAMINOPHEN 10 MG/ML IV SOLN
INTRAVENOUS | Status: DC | PRN
  Administered 2020-08-18: 1000 mg via INTRAVENOUS

## 2020-08-18 MED ORDER — HYDROCHLOROTHIAZIDE 12.5 MG PO CAPS
12.5000 mg | ORAL_CAPSULE | Freq: Every day | ORAL | Status: DC
Start: 2020-08-19 — End: 2020-08-18

## 2020-08-18 MED ORDER — ACETAMINOPHEN 10 MG/ML IV SOLN
INTRAVENOUS | Status: AC
  Filled 2020-08-18: qty 100

## 2020-08-18 MED ORDER — SODIUM CHLORIDE 0.9 % IV SOLN
1.0000 g | Freq: Four times a day (QID) | INTRAVENOUS | Status: AC
  Administered 2020-08-18 – 2020-08-19 (×3): 1 g via INTRAVENOUS
  Filled 2020-08-18 (×5): qty 1000

## 2020-08-18 MED ORDER — ONDANSETRON HCL 4 MG/2ML IJ SOLN: 4.0000 mg | Freq: Once | INTRAMUSCULAR | Status: DC | PRN

## 2020-08-18 MED ORDER — SUGAMMADEX SODIUM 200 MG/2ML IV SOLN
INTRAVENOUS | Status: DC | PRN
  Administered 2020-08-18: 200 mg via INTRAVENOUS

## 2020-08-18 MED ORDER — CEFAZOLIN SODIUM-DEXTROSE 2-4 GM/100ML-% IV SOLN
2.0000 g | INTRAVENOUS | Status: AC
  Administered 2020-08-18: 2 g via INTRAVENOUS

## 2020-08-18 MED ORDER — DEXAMETHASONE SODIUM PHOSPHATE 10 MG/ML IJ SOLN
INTRAMUSCULAR | Status: DC | PRN
  Administered 2020-08-18: 6 mg via INTRAVENOUS

## 2020-08-18 MED ORDER — DORZOLAMIDE HCL-TIMOLOL MAL 2-0.5 % OP SOLN
1.0000 [drp] | Freq: Two times a day (BID) | OPHTHALMIC | Status: DC
  Administered 2020-08-18 – 2020-08-19 (×2): 1 [drp] via OPHTHALMIC
  Filled 2020-08-18: qty 10

## 2020-08-18 MED ORDER — ONDANSETRON HCL 4 MG/2ML IJ SOLN
INTRAMUSCULAR | Status: DC | PRN
  Administered 2020-08-18: 4 mg via INTRAVENOUS

## 2020-08-18 MED ORDER — LIDOCAINE HCL (CARDIAC) PF 100 MG/5ML IV SOSY
PREFILLED_SYRINGE | INTRAVENOUS | Status: DC | PRN
  Administered 2020-08-18: 30 mg via INTRAVENOUS
  Administered 2020-08-18: 60 mg via INTRAVENOUS
  Administered 2020-08-18: 40 mg via INTRAVENOUS

## 2020-08-18 MED ORDER — ONDANSETRON HCL 4 MG/2ML IJ SOLN: 4.0000 mg | INTRAMUSCULAR | Status: DC | PRN

## 2020-08-18 SURGICAL SUPPLY — 64 items
ADAPTER IRRIG TUBE 2 SPIKE SOL (ADAPTER) ×6 IMPLANT
ADAPTER SCOPE UROLOK II (MISCELLANEOUS) IMPLANT
BAG URINE DRAIN 2000ML AR STRL (UROLOGICAL SUPPLIES) IMPLANT
BALLN NEPHROSTOMY 10X15 (UROLOGICAL SUPPLIES) ×3 IMPLANT
BASKET ZERO TIP 1.9FR (BASKET) ×3 IMPLANT
BLADE SURG 15 STRL LF DISP TIS (BLADE) ×2 IMPLANT
BLADE SURG 15 STRL SS (BLADE) ×1
CATH COUNCIL 22FR (CATHETERS) IMPLANT
CATH FOLEY 2W COUNCIL 20FR 5CC (CATHETERS) ×3 IMPLANT
CATH FOLEY 2W COUNCIL 5CC 18FR (CATHETERS) IMPLANT
CATH STENT KAYE NEPHR TAMP (CATHETERS) IMPLANT
CATH URETL 5X70 OPEN END (CATHETERS) ×3 IMPLANT
CATH/STENT KAYE NEPHR TAMP (CATHETERS)
CHLORAPREP W/TINT 26 (MISCELLANEOUS) ×3 IMPLANT
CNTNR SPEC 2.5X3XGRAD LEK (MISCELLANEOUS)
CONT SPEC 4OZ STER OR WHT (MISCELLANEOUS)
CONTAINER SPEC 2.5X3XGRAD LEK (MISCELLANEOUS) IMPLANT
COVER WAND RF STERILE (DRAPES) ×3 IMPLANT
DRAPE 3/4 80X56 (DRAPES) ×3 IMPLANT
DRAPE C-ARM XRAY 36X54 (DRAPES) ×3 IMPLANT
DRAPE SURG 17X11 SM STRL (DRAPES) ×12 IMPLANT
GAUZE SPONGE 4X4 12PLY STRL (GAUZE/BANDAGES/DRESSINGS) ×3 IMPLANT
GLIDEWIRE STIFF .35X180X3 HYDR (WIRE) IMPLANT
GLOVE SURG ENC MOIS LTX SZ6.5 (GLOVE) ×9 IMPLANT
GLOVE SURG UNDER POLY LF SZ6.5 (GLOVE) ×6 IMPLANT
GOWN STRL REUS W/ TWL LRG LVL3 (GOWN DISPOSABLE) ×4 IMPLANT
GOWN STRL REUS W/TWL LRG LVL3 (GOWN DISPOSABLE) ×2
GUIDEWIRE GREEN .038 145CM (MISCELLANEOUS) ×3 IMPLANT
GUIDEWIRE INTRO SET STRAIGHT (WIRE) ×3 IMPLANT
GUIDEWIRE STR DUAL SENSOR (WIRE) ×3 IMPLANT
GUIDEWIRE STR ZIPWIRE 035X150 (MISCELLANEOUS) IMPLANT
GUIDEWIRE SUPER STIFF (WIRE) ×3 IMPLANT
HOLDER FOLEY CATH W/STRAP (MISCELLANEOUS) ×3 IMPLANT
INTRODUCER DILATOR DOUBLE (INTRODUCER) ×3 IMPLANT
JELLY LUB 2OZ STRL (MISCELLANEOUS)
JELLY LUBE 2OZ STRL (MISCELLANEOUS) IMPLANT
KIT PROBE TRILOGY 3.9X350 (MISCELLANEOUS) ×3 IMPLANT
MANIFOLD NEPTUNE II (INSTRUMENTS) ×3 IMPLANT
MAT ABSORB  FLUID 56X50 GRAY (MISCELLANEOUS) ×1
MAT ABSORB FLUID 56X50 GRAY (MISCELLANEOUS) ×2 IMPLANT
NDL FASCIA INCISION 18GA (NEEDLE) ×3 IMPLANT
PACK BASIN MINOR ARMC (MISCELLANEOUS) ×3 IMPLANT
PAD ABD DERMACEA PRESS 5X9 (GAUZE/BANDAGES/DRESSINGS) ×6 IMPLANT
PAD PREP 24X41 OB/GYN DISP (PERSONAL CARE ITEMS) ×3 IMPLANT
SET IRRIGATING DISP (SET/KITS/TRAYS/PACK) ×3 IMPLANT
SHEET NEURO XL SOL CTL (MISCELLANEOUS) ×3 IMPLANT
SOL .9 NS 3000ML IRR  AL (IV SOLUTION) ×4
SOL .9 NS 3000ML IRR UROMATIC (IV SOLUTION) ×8 IMPLANT
SPONGE DRAIN TRACH 4X4 STRL 2S (GAUZE/BANDAGES/DRESSINGS) ×3 IMPLANT
STENT URET 6FRX24 CONTOUR (STENTS) IMPLANT
STENT URET 6FRX26 CONTOUR (STENTS) ×3 IMPLANT
STRAP SAFETY 5IN WIDE (MISCELLANEOUS) ×9 IMPLANT
SUT SILK 0 SH 30 (SUTURE) ×3 IMPLANT
SYR 10ML LL (SYRINGE) ×3 IMPLANT
SYR 20ML LL LF (SYRINGE) ×3 IMPLANT
SYR 30ML LL (SYRINGE) ×3 IMPLANT
SYR TOOMEY IRRIG 70ML (MISCELLANEOUS) ×3
SYRINGE TOOMEY IRRIG 70ML (MISCELLANEOUS) ×2 IMPLANT
TAPE CLOTH 3X10 WHT NS LF (GAUZE/BANDAGES/DRESSINGS) ×3 IMPLANT
TAPE MICROFOAM 4IN (TAPE) ×3 IMPLANT
TRAY FOLEY MTR SLVR 16FR STAT (SET/KITS/TRAYS/PACK) ×3 IMPLANT
TUBING CONNECTING 10 (TUBING) ×3 IMPLANT
VALVE UROSEAL ADJ ENDO (VALVE) ×3 IMPLANT
WATER STERILE IRR 1000ML POUR (IV SOLUTION) ×3 IMPLANT

## 2020-08-18 NOTE — Transfer of Care (Signed)
Immediate Anesthesia Transfer of Care Note  Patient: Gabrielle Barnes  Procedure(s) Performed: NEPHROLITHOTOMY PERCUTANEOUS (Left ) HOLMIUM LASER LITHOTRIPSY  Patient Location: PACU  Anesthesia Type:General  Level of Consciousness: drowsy and patient cooperative  Airway & Oxygen Therapy: Patient Spontanous Breathing and Patient connected to face mask oxygen  Post-op Assessment: Report given to RN and Post -op Vital signs reviewed and stable  Post vital signs: Reviewed and stable  Last Vitals:  Vitals Value Taken Time  BP 127/65 08/18/20 1455  Temp    Pulse 69 08/18/20 1500  Resp 18 08/18/20 1500  SpO2 100 % 08/18/20 1500  Vitals shown include unvalidated device data.  Last Pain:  Vitals:   08/18/20 1032  TempSrc:   PainSc: 4       Patients Stated Pain Goal: 1 (14/97/02 6378)  Complications: No complications documented.

## 2020-08-18 NOTE — Progress Notes (Signed)
PHARMACY -  BRIEF ANTIBIOTIC NOTE   Pharmacy has received consult(s) for gentamicin from an OR provider.  The patient's profile has been reviewed for ht/wt/allergies/indication/available labs.    One time order(s) placed for Gentamicin 5mg /kg (320mg ) pre-op.  Further antibiotics/pharmacy consults should be ordered by admitting physician if indicated.        Renda Rolls, PharmD, [facility] 08/18/2020 6:57 AM

## 2020-08-18 NOTE — Anesthesia Preprocedure Evaluation (Addendum)
Anesthesia Evaluation  Patient identified by MRN, date of birth, ID band Patient awake    Reviewed: Allergy & Precautions, H&P , NPO status , Patient's Chart, lab work & pertinent test results, reviewed documented beta blocker date and time   Airway Mallampati: II  TM Distance: >3 FB Neck ROM: full    Dental  (+) Teeth Intact   Pulmonary neg pulmonary ROS,    Pulmonary exam normal        Cardiovascular Exercise Tolerance: Good hypertension, On Medications negative cardio ROS Normal cardiovascular exam Rhythm:regular Rate:Normal     Neuro/Psych  Headaches, PSYCHIATRIC DISORDERS Depression    GI/Hepatic Neg liver ROS, GERD  Medicated,  Endo/Other  negative endocrine ROS  Renal/GU Renal disease  negative genitourinary   Musculoskeletal   Abdominal   Peds  Hematology negative hematology ROS (+)   Anesthesia Other Findings Past Medical History: 06/2020: COVID-19 No date: Depression     Comment:  after husbands death No date: GERD (gastroesophageal reflux disease) No date: History of chicken pox No date: History of kidney stones No date: History of measles No date: Hypertension No date: Migraine Past Surgical History: No date: ABDOMINAL HYSTERECTOMY No date: APPENDECTOMY No date: CESAREAN SECTION No date: colonoscopy 06/13/2020: XI ROBOTIC LAPAROSCOPIC ASSISTED APPENDECTOMY; Right     Comment:  Procedure: XI ROBOTIC LAPAROSCOPIC ASSISTED               APPENDECTOMY, RIGHT COLECTOMY;  Surgeon: Ronny Bacon, MD;  Location: [facility] ORS;  Service: General;                Laterality: Right; BMI    Body Mass Index: 27.79 kg/m     Reproductive/Obstetrics negative OB ROS                             Anesthesia Physical Anesthesia Plan  ASA: II  Anesthesia Plan: General ETT   Post-op Pain Management:    Induction: Intravenous  PONV Risk Score and Plan: 3 and  Ondansetron and Dexamethasone  Airway Management Planned: Oral ETT  Additional Equipment:   Intra-op Plan:   Post-operative Plan:   Informed Consent: I have reviewed the patients History and Physical, chart, labs and discussed the procedure including the risks, benefits and alternatives for the proposed anesthesia with the patient or authorized representative who has indicated his/her understanding and acceptance.     Dental Advisory Given  Plan Discussed with: CRNA, Anesthesiologist and Surgeon  Anesthesia Plan Comments:        Anesthesia Quick Evaluation

## 2020-08-18 NOTE — Interval H&P Note (Signed)
History and Physical Interval Note:  08/18/2020 7:27 AM  Gabrielle Barnes  has presented today for surgery, with the diagnosis of left staghorn calculus.  The various methods of treatment have been discussed with the patient and family. After consideration of risks, benefits and other options for treatment, the patient has consented to  Procedure(s) with comments: NEPHROLITHOTOMY PERCUTANEOUS (Left) - possibly appropriate for [name] 3B as a surgical intervention.  The patient's history has been reviewed, patient examined, no change in status, stable for surgery.  I have reviewed the patient's chart and labs.  Questions were answered to the patient's satisfaction.    RRR CTAB  Hollice Espy

## 2020-08-18 NOTE — Procedures (Signed)
  Procedure: L antegrade 11f nephroureteral catheter placed for nephrolithotomy EBL:   minimal Complications:  none immediate  See full dictation in [facility].  Dillard Cannon MD Main # [phone] Pager  [phone] Mobile [phone]

## 2020-08-18 NOTE — Anesthesia Procedure Notes (Signed)
Procedure Name: Intubation Date/Time: 08/18/2020 11:26 AM Performed by: Lia Foyer, CRNA Pre-anesthesia Checklist: Patient identified, Emergency Drugs available, Suction available and Patient being monitored Patient Re-evaluated:Patient Re-evaluated prior to induction Oxygen Delivery Method: Circle system utilized Preoxygenation: Pre-oxygenation with 100% oxygen Induction Type: IV induction Ventilation: Mask ventilation without difficulty Laryngoscope Size: McGraph and 3 Grade View: Grade I Tube type: Oral Number of attempts: 1 Airway Equipment and Method: Stylet,  Oral airway and Video-laryngoscopy Placement Confirmation: ETT inserted through vocal cords under direct vision,  positive ETCO2 and breath sounds checked- equal and bilateral Secured at: 20 cm Tube secured with: Tape Dental Injury: Teeth and Oropharynx as per pre-operative assessment

## 2020-08-18 NOTE — Op Note (Addendum)
Date of procedure: 08/18/20  Preoperative diagnosis:  1. Left staghorn calculus,  6 cm  Postoperative diagnosis:  1. Same as above  Procedure: 1. Left percutaneous nephrolithotomy 2. Left antegrade nephrostogram 3. Left antegrade ureteral stent placement 4. Left nephrostomy tube placement 5. Laser lithotripsy 6. Basket extraction of stone fragment 7. Interpretation of fluoroscopy less than 30 minutes  Surgeon: Hollice Espy, MD  Anesthesia: General  Complications: None  Intraoperative findings: Left renal unit cleared of all lower/mid pole stone as well as renal pelvic stone.  Unable to angle scope to access upper pole.  Procedure otherwise uncomplicated.  EBL: 100 cc  Specimens: Stone fragment  Drains: 6 x 26 French double-J ureteral stent on left, 31 [place] council tip Foley catheter is a left percutaneous nephrostomy tube, 44 French Foley catheter per bladder  Indication: Gabrielle Barnes is a 70 y.o. patient with bilateral full staghorn who is elected left PCNL via staged procedure to start treatment of her large stone burden.  After reviewing the management options for treatment, she elected to proceed with the above surgical procedure(s). We have discussed the potential benefits and risks of the procedure, side effects of the proposed treatment, the likelihood of the patient achieving the goals of the procedure, and any potential problems that might occur during the procedure or recuperation. Informed consent has been obtained.  Description of procedure:  The patient was taken to the operating room and general anesthesia was induced.  A Foley catheter was placed on the stretcher and she was intubated.  She was then repositioned on the operative table in a prone position onto gel pad bolsters with extreme care taken to ensure that all pressure points were padded.  She was then prepped and draped in standard sterile fashion.  Scout imaging revealed a left nephroureteral catheter  traversing the lower pole calyx into the renal pelvis and down the ureter.  Notably, she had a full staghorn calculus which appeared to occupy each calyx along with the entirety of the renal pelvis beaking at the UPJ.  I placed a Super Stiff wire through the nephroureteral catheter down to the level of the bladder.  The wire was left in place and the nephroureteral catheter was removed.  I used a dual-lumen access sheath then to introduce a second sensor wire uses a safety wire.  A fascial incising needle was then used over the wire to make a cruciate incision in the lumbodorsal fascia.  A NephroMax balloon was then inserted over the Super Stiff working wire to the level of the stone.  There is some tactile feedback with the tip of the balloon at the stone I was unable to advance the the balloon beyond this whatsoever.  The balloon was then inflated with Optiray up to 18 cm of water and allowed to remain in place for several minutes.  I then advanced the access sheath over top of the balloon ensuring proper positioning by reangling the C arm.  Finally, after the nephroscope was assembled, the balloon was taken down.  There is a small amount of bleeding but not profound.  I advanced the scope along the Super Stiff wire at which time the lower pole stone burden was immediately encountered.  The lithoclast was then brought in using both pneumatic and ultrasonic components, was used to fragment the lower pole stone and aspirated.  Was able to break the stone into a few larger pieces few of which were basket extracted.  Had some difficulty initially navigating beyond  this lower pole calyx as the sheath had backed out some and had to redilate the tract and readvanced the sheath.  Ultimately, I was able to navigate into the renal pelvis as well as an additional mid and lower pole calyx and clear all of this stone burden.  At this point I could finally see the wires traversing down the UPJ.  Unfortunately, was not able to  angle the scope in order to access any of the upper pole stone burden as this is a relatively acute angle double backing both superiorly and posteriorly which is not not achievable without torquing significantly.  I then brought in the flexible cystoscope and was able to advance the scope down to the proximal ureter where a larger fragment was identified and extracted using 1.9 tipless nitinol basket.  Conray was injected through the cystoscope which revealed no contrast extravasation within the collecting system other than that the junction of the access sheath with the kidney which was expected.  There is no extravasation from the collecting system itself.  I then was able to angle the flexible cystoscope in order to access the relatively narrow infundibulum up to the upper pole moiety.  The stone at the tip of this infundibulum could be seen.  I attempted to use a 242 laser and using dusting settings of 0.2 J and 40 Hz, I was able to clear a small amount of the stone burden entering this portion of the kidney but again due to the angulation, was not terribly successful or efficient at doing this there is a large amount of torque on the scope as well as the laser fiber.  I also was creating some trauma on the mucosa on the anterior aspect of this infundibulum and this after attempting this for almost an hour, I elected to abort this effort.  I considered to having interventional radiology attempt a second access point in the operating room however on palpation, this seemed to be overlying her rib cage and this accessing the mid upper pole seemed unlikely and relatively difficult.  Notably, in looking at the remainder of the stone burden, the ureteroscopic approach in a retrograde fashion seem to be most favorable despite being a significant overall stone burden.  As such, elected to place a double-J ureteral stent and return for staged procedure ureteroscopically down the road.  We discussed this option preop  based on her anatomy and overall stone burden.  A 6 x 26 double-J ureteral stent was advanced over the previously placed already in position Super Stiff wire fluoroscopically advanced to the level of the bladder.  The wires partially drawn till full entire coil was noted within the renal pelvis.  And then used the nephroscope to ensure that a full appropriate coil was created within the renal pelvis itself with excellent positioning of the stent.  Next, a new wire was replaced and coiled in the renal pelvis.  I advanced a 45 [place] council tip over this with the tip in the renal pelvis and filled the balloon with approximately 1.5 cc of diluted Conray.  And then performed a antegrade nephrostogram again outlining now widely patent and renal pelvis which filled with contrast nicely and traversed down the course of the ureter without any obvious filling defects or large stone burden in the ureter itself.  Finally, the sheath was removed leaving the Foley catheter in place is not nephrostomy tube.  The sheath was freed from the Foley and the Foley was secured to the patient's  left flank using silk ties x2.  She was then cleaned and dried.  A dressing of 4 x 4's, and ABD pad and foam tape was applied and the nephrostomy tube was left to open drainage.  She was then repositioned back in the supine position on the stretcher and reversed from anesthesia and extubated.  She remained hemodynamically stable throughout the entirety of the procedure.  Plan: Should be admitted overnight for observation.  We will plan to clamp her nephrostomy tube in the morning along with removing her Foley for labs are stable.  We will remove her nephrostomy tube later in the day if she tolerates this clamp trial.  She should do well with a ureteral stent in place we will tentatively plan to bring her back for ureteroscopy in 3 to 4 weeks to address her remaining stone burden.  A 21 Kurtis Bushman, M.D.

## 2020-08-18 NOTE — OR Nursing (Signed)
Patient returned from specials recovery after tube placement.  Patient denies any pain, just the urge to urinate.  PureWic in place. Lights turned down and encouraged to rest until surgery.

## 2020-08-18 NOTE — Sedation Documentation (Signed)
Total conscious sedation: Versed 2 mg IV, Fentanyl 50 mcg IV. Ancef 2 GM IVPB. Pt. Tolerated procedure well. Stable for tx to [facility].

## 2020-08-19 ENCOUNTER — Encounter: Payer: Self-pay | Admitting: Urology

## 2020-08-19 DIAGNOSIS — N200 Calculus of kidney: Secondary | ICD-10-CM

## 2020-08-19 DIAGNOSIS — N202 Calculus of kidney with calculus of ureter: Secondary | ICD-10-CM | POA: Diagnosis not present

## 2020-08-19 LAB — CBC
HCT: 28.7 % — ABNORMAL LOW (ref 36.0–46.0)
Hemoglobin: 9.1 g/dL — ABNORMAL LOW (ref 12.0–15.0)
MCH: 28 pg (ref 26.0–34.0)
MCHC: 31.7 g/dL (ref 30.0–36.0)
MCV: 88.3 fL (ref 80.0–100.0)
Platelets: 227 10*3/uL (ref 150–400)
RBC: 3.25 MIL/uL — ABNORMAL LOW (ref 3.87–5.11)
RDW: 15.6 % — ABNORMAL HIGH (ref 11.5–15.5)
WBC: 22.7 10*3/uL — ABNORMAL HIGH (ref 4.0–10.5)
nRBC: 0 % (ref 0.0–0.2)

## 2020-08-19 LAB — BASIC METABOLIC PANEL
Anion gap: 9 (ref 5–15)
BUN: 14 mg/dL (ref 8–23)
CO2: 23 mmol/L (ref 22–32)
Calcium: 7 mg/dL — ABNORMAL LOW (ref 8.9–10.3)
Chloride: 104 mmol/L (ref 98–111)
Creatinine, Ser: 1.07 mg/dL — ABNORMAL HIGH (ref 0.44–1.00)
GFR, Estimated: 56 mL/min — ABNORMAL LOW (ref >60)
Glucose, Bld: 133 mg/dL — ABNORMAL HIGH (ref 70–99)
Potassium: 4.6 mmol/L (ref 3.5–5.1)
Sodium: 136 mmol/L (ref 135–145)

## 2020-08-19 LAB — HEMOGLOBIN AND HEMATOCRIT, BLOOD
HCT: 27.7 % — ABNORMAL LOW (ref 36.0–46.0)
Hemoglobin: 9.1 g/dL — ABNORMAL LOW (ref 12.0–15.0)

## 2020-08-19 MED ORDER — TAMSULOSIN HCL 0.4 MG PO CAPS: 0.4000 mg | ORAL_CAPSULE | Freq: Every day | ORAL | 0 refills | Status: DC

## 2020-08-19 MED ORDER — OXYCODONE-ACETAMINOPHEN 5-325 MG PO TABS
ORAL_TABLET | ORAL | Status: AC
  Administered 2020-08-19: 2 via ORAL
  Filled 2020-08-19: qty 2

## 2020-08-19 MED ORDER — HEPARIN SODIUM (PORCINE) 5000 UNIT/ML IJ SOLN
INTRAMUSCULAR | Status: AC
  Administered 2020-08-19: 5000 [IU] via SUBCUTANEOUS
  Filled 2020-08-19: qty 1

## 2020-08-19 MED ORDER — OXYCODONE-ACETAMINOPHEN 5-325 MG PO TABS: 1.0000 | ORAL_TABLET | Freq: Four times a day (QID) | ORAL | 0 refills | Status: DC | PRN

## 2020-08-19 MED ORDER — DOCUSATE SODIUM 100 MG PO CAPS: 100.0000 mg | ORAL_CAPSULE | Freq: Two times a day (BID) | ORAL | 0 refills | Status: DC

## 2020-08-19 MED ORDER — OXYBUTYNIN CHLORIDE 5 MG PO TABS: 5.0000 mg | ORAL_TABLET | Freq: Three times a day (TID) | ORAL | 1 refills | Status: DC | PRN

## 2020-08-19 NOTE — Progress Notes (Signed)
Nsg Discharge Note  Admit Date:  08/18/2020 Discharge date: 08/19/2020   Nyoka Cowden to be D/C'd Home per MD order.  AVS completed.  Patient/caregiver able to verbalize understanding.  Discharge Medication: Allergies as of 08/19/2020   Not on File     Medication List    TAKE these medications   alendronate 70 MG tablet Commonly known as: FOSAMAX Take 1 tablet (70 mg total) by mouth once a week. Take with a full glass of water on an empty stomach.   CALCIUM 600/VITAMIN D3 PO Take 1 tablet by mouth daily.   diphenoxylate-atropine 2.5-0.025 MG tablet Commonly known as: LOMOTIL Take 1 tablet by mouth 4 (four) times daily as needed for diarrhea or loose stools.   docusate sodium 100 MG capsule Commonly known as: COLACE Take 1 capsule (100 mg total) by mouth 2 (two) times daily.   dorzolamide-timolol 22.3-6.8 MG/ML ophthalmic solution Commonly known as: COSOPT Place 1 drop into both eyes 2 (two) times daily.   famotidine 20 MG tablet Commonly known as: PEPCID TAKE 1 TABLET BY MOUTH TWICE A DAY What changed:   how much to take  when to take this  reasons to take this   ibuprofen 800 MG tablet Commonly known as: ADVIL Take 1 tablet (800 mg total) by mouth every 8 (eight) hours as needed.   losartan-hydrochlorothiazide 50-12.5 MG tablet Commonly known as: HYZAAR Take 1 tablet by mouth every morning.   lovastatin 40 MG tablet Commonly known as: MEVACOR TAKE 2 TABLETS BY MOUTH EVERY EVENING   oxybutynin 5 MG tablet Commonly known as: DITROPAN Take 1 tablet (5 mg total) by mouth every 8 (eight) hours as needed for bladder spasms.   oxyCODONE-acetaminophen 5-325 MG tablet Commonly known as: PERCOCET/ROXICET Take 1-2 tablets by mouth every 6 (six) hours as needed for moderate pain.   sulfamethoxazole-trimethoprim 800-160 MG tablet Commonly known as: BACTRIM DS Take 1 tablet by mouth 2 (two) times daily.   tamsulosin 0.4 MG Caps capsule Commonly known as:  FLOMAX Take 1 capsule (0.4 mg total) by mouth daily.   traZODone 100 MG tablet Commonly known as: DESYREL TAKE 1 TABLET BY MOUTH EVERY NIGHT AT BEDTIME       Discharge Assessment: Vitals:   08/19/20 0800 08/19/20 1158  BP: 118/60 (!) 114/56  Pulse: 74 79  Resp: 16 18  Temp: (!) 97.1 F (36.2 C) 98.9 F (37.2 C)  SpO2:  97%   Skin clean, dry and intact without evidence of skin break down, no evidence of skin tears noted. IV catheter discontinued intact. Site without signs and symptoms of complications - no redness or edema noted at insertion site, patient denies c/o pain - only slight tenderness at site.  Dressing with slight pressure applied.  D/c Instructions-Education: Discharge instructions given to patient/family with verbalized understanding. D/c education completed with patient/family including follow up instructions, medication list, d/c activities limitations if indicated, with other d/c instructions as indicated by MD - patient able to verbalize understanding, all questions fully answered. Patient instructed to return to ED, call 911, or call MD for any changes in condition.  Patient escorted via [place], and D/C home via private auto.  Tresa Endo, RN 08/19/2020 4:10 PM

## 2020-08-19 NOTE — H&P (View-Only) (Signed)
Urology Inpatient Progress Note  Subjective: No acute events overnight.  Afebrile, VSS. WBC count of today, 22.7.  Hemoglobin down today, 9.1.  Creatinine stable, 1.07. Foley catheter in place draining clear, yellow urine.  Left nephrostomy tube in place draining clear, red urine. Patient reports postoperative left flank pain that is improved this morning.  No acute concerns.  Anti-infectives: Anti-infectives (From admission, onward)   Start     Dose/Rate Route Frequency Ordered Stop   08/18/20 1600  ampicillin (OMNIPEN) 1 g in sodium chloride 0.9 % 100 mL IVPB        1 g 300 mL/hr over 20 Minutes Intravenous Every 6 hours 08/18/20 1546 08/19/20 0535   08/18/20 0700  ampicillin (OMNIPEN) 1 g in sodium chloride 0.9 % 100 mL IVPB        1 g 200 mL/hr over 30 Minutes Intravenous On call to O.R. 08/18/20 0651 08/18/20 1901   08/18/20 0700  gentamicin (GARAMYCIN) 320 mg in dextrose 5 % 50 mL IVPB        5 mg/kg  64.5 kg 116 mL/hr over 30 Minutes Intravenous  Once 08/18/20 0655 08/18/20 1901   08/18/20 0645  ampicillin (OMNIPEN) 1 g in sodium chloride 0.9 % 50 mL IVPB  Status:  Discontinued        1 g 100 mL/hr over 30 Minutes Intravenous On call to O.R. 08/18/20 3818 08/18/20 2993      Current Facility-Administered Medications  Medication Dose Route Frequency Provider Last Rate Last Admin  . 0.9 %  sodium chloride infusion   Intravenous Continuous Hollice Espy, MD 125 mL/hr at 08/19/20 0156 New Bag at 08/19/20 0156  . acetaminophen (TYLENOL) tablet 650 mg  650 mg Oral Q4H PRN Hollice Espy, MD      . diphenhydrAMINE (BENADRYL) injection 12.5 mg  12.5 mg Intravenous Q6H PRN Hollice Espy, MD       Or  . diphenhydrAMINE (BENADRYL) 12.5 MG/5ML elixir 12.5 mg  12.5 mg Oral Q6H PRN Hollice Espy, MD      . docusate sodium (COLACE) capsule 100 mg  100 mg Oral BID Hollice Espy, MD   100 mg at 08/18/20 2156  . dorzolamide-timolol (COSOPT) 22.3-6.8 MG/ML ophthalmic solution 1 drop  1  drop Both Eyes BID Hollice Espy, MD   1 drop at 08/18/20 2153  . famotidine (PEPCID) tablet 40 mg  40 mg Oral Daily PRN Hollice Espy, MD      . heparin injection 5,000 Units  5,000 Units Subcutaneous Q8H Hollice Espy, MD   5,000 Units at 08/19/20 [phone]  . losartan (COZAAR) tablet 50 mg  50 mg Oral Daily Hollice Espy, MD       And  . hydrochlorothiazide (HYDRODIURIL) tablet 12.5 mg  12.5 mg Oral Daily Hollice Espy, MD      . morphine 4 MG/ML injection 2-4 mg  2-4 mg Intravenous Q2H PRN Hollice Espy, MD      . ondansetron [facility]) injection 4 mg  4 mg Intravenous Q4H PRN Hollice Espy, MD      . oxybutynin (DITROPAN) tablet 5 mg  5 mg Oral Q8H PRN Hollice Espy, MD   5 mg at 08/18/20 1703  . oxyCODONE-acetaminophen (PERCOCET/ROXICET) 5-325 MG per tablet 1-2 tablet  1-2 tablet Oral Q4H PRN Hollice Espy, MD   2 tablet at 08/18/20 2156  . pravastatin (PRAVACHOL) tablet 40 mg  40 mg Oral q1800 Hollice Espy, MD   40 mg at 08/18/20 1702   Objective: Vital signs in last  24 hours: Temp:  [97 F (36.1 C)-98.1 F (36.7 C)] 97.1 F (36.2 C) (01/11 0800) Pulse Rate:  [64-82] 74 (01/11 0800) Resp:  [14-22] 16 (01/11 0800) BP: (113-148)/(57-78) 118/60 (01/11 0800) SpO2:  [94 %-100 %] 96 % (01/11 0600)  Intake/Output from previous day: 01/10 0701 - 01/11 0700 In: 2928 [I.V.:2570; IV Piggyback:358] Out: 1230 [Urine:1230] Intake/Output this shift: No intake/output data recorded.  Physical Exam Vitals and nursing note reviewed.  Constitutional:      General: She is not in acute distress.    Appearance: She is not ill-appearing, toxic-appearing or diaphoretic.  HENT:     Head: Normocephalic and atraumatic.  Pulmonary:     Effort: Pulmonary effort is normal. No respiratory distress.  Skin:    General: Skin is warm and dry.  Neurological:     Mental Status: She is alert and oriented to person, place, and time.  Psychiatric:        Mood and Affect: Mood normal.         Behavior: Behavior normal.    Lab Results:  Recent Labs    08/19/20 0647  WBC 22.7*  HGB 9.1*  HCT 28.7*  PLT 227   BMET Recent Labs    08/19/20 0647  NA 136  K 4.6  CL 104  CO2 23  GLUCOSE 133*  BUN 14  CREATININE 1.07*  CALCIUM 7.0*   Assessment & Plan: 70-year-old female with bilateral staghorn renal stones now POD 1 from left PCNL with Dr. Brandon for management of her left staghorn stone.  Lower pole, mid pole, and renal pelvic portions cleared intraoperatively with plans for staged approach to clear the upper pole in 3-4 weeks.  Patient recovering well this morning with improvement of postoperative pain.  I removed the 16 French Foley catheter at the bedside this morning, draining 10 cc of sterile water from the balloon prior.  Next, I detached the nephrostomy tube from its drainage bag and plugged it.  We will proceed with clamp trial this morning.  In the absence of new flank pain, will return to the bedside to remove the nephrostomy tube midday.  We will tentatively plan for discharge later this afternoon.  I discussed the need for staged approach with the patient and her daughter at the bedside today.  They expressed understanding.  Lotta Frankenfield, PA-C 08/19/2020  

## 2020-08-19 NOTE — Discharge Summary (Signed)
Date of admission: 08/18/2020  Date of discharge: 08/19/2020  Admission diagnosis: Left staghorn calculus  Discharge diagnosis: Same as above  Secondary diagnoses:  Patient Active Problem List   Diagnosis Date Noted  . 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 Microhematuria 04/20/2020  . Osteoporosis 12/01/2016  . Allergic rhinitis 06/17/2015  . Depression 06/17/2015  . Glaucoma 06/17/2015  . 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 Headache 06/17/2015  . HLD (hyperlipidemia) 06/17/2015  . Insomnia 06/17/2015    History and Physical: For full details, please see admission history and physical. Briefly, Gabrielle Barnes is a 70 y.o. year old patient admitted on 08/18/2020 for scheduled left PCNL with Dr. Erlene Quan for management of a left staghorn calculus with successful clearance of the lower pole, midpole, and renal pelvic segments of the stone.   Hospital Course: Patient tolerated the procedure well.  She was then transferred to the floor after an uneventful PACU stay.  Her hospital course was uncomplicated.  On POD#1 she had met discharge criteria: was eating a regular diet, was up and ambulating independently,  pain was well controlled, was voiding without a catheter, postop hemoglobin was stable, no new flank pain with clamp trial of her nephrostomy tube, and was ready for discharge.  Foley catheter and nephrostomy tube were discontinued prior to discharge and a left ureteral stent remains in place.  Laboratory values:  Recent Labs    08/19/20 0647 08/19/20 1310  WBC 22.7*  --   HGB 9.1* 9.1*  HCT 28.7* 27.7*   Recent Labs    08/19/20 0647  NA 136  K 4.6  CL 104  CO2 23  GLUCOSE 133*  BUN 14  CREATININE 1.07*  CALCIUM 7.0*   Results for orders placed or performed during the hospital encounter of 08/15/20  SARS CORONAVIRUS 2 (TAT 6-24 HRS) Nasopharyngeal Nasopharyngeal Swab     Status: None    Collection Time: 08/15/20  3:49 PM   Specimen: Nasopharyngeal Swab  Result Value Ref Range Status   SARS Coronavirus 2 NEGATIVE NEGATIVE Final    Comment: (NOTE) SARS-CoV-2 target nucleic acids are NOT DETECTED.  The SARS-CoV-2 RNA is generally detectable in upper and lower respiratory specimens during the acute phase of infection. Negative results do not preclude SARS-CoV-2 infection, do not rule out co-infections with other pathogens, and should not be used as the sole basis for treatment or other patient management decisions. Negative results must be combined with clinical observations, patient history, and epidemiological information. The expected result is Negative.  Fact Sheet for Patients: SugarRoll.be  Fact Sheet for Healthcare Providers: https://www.woods-mathews.com/  This test is not yet approved or cleared by the [place] FDA and  has been authorized for detection and/or diagnosis of SARS-CoV-2 by FDA under an Emergency Use Authorization (EUA). This EUA will remain  in effect (meaning this test can be used) for the duration of the COVID-19 declaration under Se ction 564(b)(1) of the Act, 21 U.S.C. section 360bbb-3(b)(1), unless the authorization is terminated or revoked sooner.  Performed at [facility], [place] [address], [place], [place] [address]    Disposition: Home  Discharge instruction: The patient was instructed to be ambulatory but told to refrain from heavy lifting, strenuous activity, or driving.  She was counseled to cover her left nephrostomy tube site with a waterproof dressing and counseled that she would continue to experience urinary leakage from the site for several days but that  the wound would heal on its own.  We are planning for staged PCNL to clear the upper pole segment of her stone in 3 to 4 weeks.  Discharge medications:  Allergies as of 08/19/2020   Not on File     Medication List     TAKE these medications   alendronate 70 MG tablet Commonly known as: FOSAMAX Take 1 tablet (70 mg total) by mouth once a week. Take with a full glass of water on an empty stomach.   CALCIUM 600/VITAMIN D3 PO Take 1 tablet by mouth daily.   diphenoxylate-atropine 2.5-0.025 MG tablet Commonly known as: LOMOTIL Take 1 tablet by mouth 4 (four) times daily as needed for diarrhea or loose stools.   docusate sodium 100 MG capsule Commonly known as: COLACE Take 1 capsule (100 mg total) by mouth 2 (two) times daily.   dorzolamide-timolol 22.3-6.8 MG/ML ophthalmic solution Commonly known as: COSOPT Place 1 drop into both eyes 2 (two) times daily.   famotidine 20 MG tablet Commonly known as: PEPCID TAKE 1 TABLET BY MOUTH TWICE A DAY What changed:   how much to take  when to take this  reasons to take this   ibuprofen 800 MG tablet Commonly known as: ADVIL Take 1 tablet (800 mg total) by mouth every 8 (eight) hours as needed.   losartan-hydrochlorothiazide 50-12.5 MG tablet Commonly known as: HYZAAR Take 1 tablet by mouth every morning.   lovastatin 40 MG tablet Commonly known as: MEVACOR TAKE 2 TABLETS BY MOUTH EVERY EVENING   oxybutynin 5 MG tablet Commonly known as: DITROPAN Take 1 tablet (5 mg total) by mouth every 8 (eight) hours as needed for bladder spasms.   oxyCODONE-acetaminophen 5-325 MG tablet Commonly known as: PERCOCET/ROXICET Take 1-2 tablets by mouth every 6 (six) hours as needed for moderate pain.   sulfamethoxazole-trimethoprim 800-160 MG tablet Commonly known as: BACTRIM DS Take 1 tablet by mouth 2 (two) times daily.   tamsulosin 0.4 MG Caps capsule Commonly known as: FLOMAX Take 1 capsule (0.4 mg total) by mouth daily.   traZODone 100 MG tablet Commonly known as: DESYREL TAKE 1 TABLET BY MOUTH EVERY NIGHT AT BEDTIME       Followup:  Staged left PCNL in 3-4 weeks; our office will call to schedule this.

## 2020-08-19 NOTE — Anesthesia Postprocedure Evaluation (Signed)
Anesthesia Post Note  Patient: Gabrielle Barnes  Procedure(s) Performed: NEPHROLITHOTOMY PERCUTANEOUS (Left ) HOLMIUM LASER LITHOTRIPSY  Patient location during evaluation: PACU Anesthesia Type: General Level of consciousness: awake and alert and oriented Pain management: pain level controlled Vital Signs Assessment: post-procedure vital signs reviewed and stable Respiratory status: spontaneous breathing Cardiovascular status: blood pressure returned to baseline Anesthetic complications: no   No complications documented.   Last Vitals:  Vitals:   08/19/20 0600 08/19/20 0800  BP: (!) 118/57 118/60  Pulse: 75 74  Resp: 16 16  Temp: (!) 36.4 C (!) 36.2 C  SpO2: 96%     Last Pain:  Vitals:   08/19/20 0800  TempSrc: Temporal  PainSc:                  Kaidance Pantoja

## 2020-08-19 NOTE — Progress Notes (Signed)
Urology Inpatient Progress Note  Subjective: No acute events overnight.  Afebrile, VSS. WBC count of today, 22.7.  Hemoglobin down today, 9.1.  Creatinine stable, 1.07. Foley catheter in place draining clear, yellow urine.  Left nephrostomy tube in place draining clear, red urine. Patient reports postoperative left flank pain that is improved this morning.  No acute concerns.  Anti-infectives: Anti-infectives (From admission, onward)   Start     Dose/Rate Route Frequency Ordered Stop   08/18/20 1600  ampicillin (OMNIPEN) 1 g in sodium chloride 0.9 % 100 mL IVPB        1 g 300 mL/hr over 20 Minutes Intravenous Every 6 hours 08/18/20 1546 08/19/20 0535   08/18/20 0700  ampicillin (OMNIPEN) 1 g in sodium chloride 0.9 % 100 mL IVPB        1 g 200 mL/hr over 30 Minutes Intravenous On call to O.R. 08/18/20 0651 08/18/20 1901   08/18/20 0700  gentamicin (GARAMYCIN) 320 mg in dextrose 5 % 50 mL IVPB        5 mg/kg  64.5 kg 116 mL/hr over 30 Minutes Intravenous  Once 08/18/20 0655 08/18/20 1901   08/18/20 0645  ampicillin (OMNIPEN) 1 g in sodium chloride 0.9 % 50 mL IVPB  Status:  Discontinued        1 g 100 mL/hr over 30 Minutes Intravenous On call to O.R. 08/18/20 3818 08/18/20 2993      Current Facility-Administered Medications  Medication Dose Route Frequency Provider Last Rate Last Admin  . 0.9 %  sodium chloride infusion   Intravenous Continuous Hollice Espy, MD 125 mL/hr at 08/19/20 0156 New Bag at 08/19/20 0156  . acetaminophen (TYLENOL) tablet 650 mg  650 mg Oral Q4H PRN Hollice Espy, MD      . diphenhydrAMINE (BENADRYL) injection 12.5 mg  12.5 mg Intravenous Q6H PRN Hollice Espy, MD       Or  . diphenhydrAMINE (BENADRYL) 12.5 MG/5ML elixir 12.5 mg  12.5 mg Oral Q6H PRN Hollice Espy, MD      . docusate sodium (COLACE) capsule 100 mg  100 mg Oral BID Hollice Espy, MD   100 mg at 08/18/20 2156  . dorzolamide-timolol (COSOPT) 22.3-6.8 MG/ML ophthalmic solution 1 drop  1  drop Both Eyes BID Hollice Espy, MD   1 drop at 08/18/20 2153  . famotidine (PEPCID) tablet 40 mg  40 mg Oral Daily PRN Hollice Espy, MD      . heparin injection 5,000 Units  5,000 Units Subcutaneous Q8H Hollice Espy, MD   5,000 Units at 08/19/20 [phone]  . losartan (COZAAR) tablet 50 mg  50 mg Oral Daily Hollice Espy, MD       And  . hydrochlorothiazide (HYDRODIURIL) tablet 12.5 mg  12.5 mg Oral Daily Hollice Espy, MD      . morphine 4 MG/ML injection 2-4 mg  2-4 mg Intravenous Q2H PRN Hollice Espy, MD      . ondansetron [facility]) injection 4 mg  4 mg Intravenous Q4H PRN Hollice Espy, MD      . oxybutynin (DITROPAN) tablet 5 mg  5 mg Oral Q8H PRN Hollice Espy, MD   5 mg at 08/18/20 1703  . oxyCODONE-acetaminophen (PERCOCET/ROXICET) 5-325 MG per tablet 1-2 tablet  1-2 tablet Oral Q4H PRN Hollice Espy, MD   2 tablet at 08/18/20 2156  . pravastatin (PRAVACHOL) tablet 40 mg  40 mg Oral q1800 Hollice Espy, MD   40 mg at 08/18/20 1702   Objective: Vital signs in last  24 hours: Temp:  [97 F (36.1 C)-98.1 F (36.7 C)] 97.1 F (36.2 C) (01/11 0800) Pulse Rate:  [64-82] 74 (01/11 0800) Resp:  [14-22] 16 (01/11 0800) BP: (113-148)/(57-78) 118/60 (01/11 0800) SpO2:  [94 %-100 %] 96 % (01/11 0600)  Intake/Output from previous day: 01/10 0701 - 01/11 0700 In: 2928 [I.V.:2570; IV Piggyback:358] Out: 1230 [Urine:1230] Intake/Output this shift: No intake/output data recorded.  Physical Exam Vitals and nursing note reviewed.  Constitutional:      General: She is not in acute distress.    Appearance: She is not ill-appearing, toxic-appearing or diaphoretic.  HENT:     Head: Normocephalic and atraumatic.  Pulmonary:     Effort: Pulmonary effort is normal. No respiratory distress.  Skin:    General: Skin is warm and dry.  Neurological:     Mental Status: She is alert and oriented to person, place, and time.  Psychiatric:        Mood and Affect: Mood normal.         Behavior: Behavior normal.    Lab Results:  Recent Labs    08/19/20 0647  WBC 22.7*  HGB 9.1*  HCT 28.7*  PLT 227   BMET Recent Labs    08/19/20 0647  NA 136  K 4.6  CL 104  CO2 23  GLUCOSE 133*  BUN 14  CREATININE 1.07*  CALCIUM 7.0*   Assessment & Plan: 70 year old female with bilateral staghorn renal stones now POD 1 from left PCNL with Dr. Erlene Quan for management of her left staghorn stone.  Lower pole, mid pole, and renal pelvic portions cleared intraoperatively with plans for staged approach to clear the upper pole in 3-4 weeks.  Patient recovering well this morning with improvement of postoperative pain.  I removed the 16 French Foley catheter at the bedside this morning, draining 10 cc of sterile water from the balloon prior.  Next, I detached the nephrostomy tube from its drainage bag and plugged it.  We will proceed with clamp trial this morning.  In the absence of new flank pain, will return to the bedside to remove the nephrostomy tube midday.  We will tentatively plan for discharge later this afternoon.  I discussed the need for staged approach with the patient and her daughter at the bedside today.  They expressed understanding.  Debroah Loop, PA-C 08/19/2020

## 2020-08-20 ENCOUNTER — Telehealth: Payer: Self-pay

## 2020-08-20 MED ORDER — ONDANSETRON HCL 4 MG PO TABS: 4.0000 mg | ORAL_TABLET | Freq: Three times a day (TID) | ORAL | 0 refills | Status: DC | PRN

## 2020-08-20 NOTE — Telephone Encounter (Signed)
Patient's daughter aware, sent in RX.

## 2020-08-20 NOTE — Telephone Encounter (Signed)
Yes, please send her Zofran 4 mg dispense #30

## 2020-08-20 NOTE — Telephone Encounter (Signed)
Patient's daughter called stating that she is having signification pain at the incision site and flank pain. She confirms that she is taking tamsulosin, oxybutinin, and oxycodone. Patient is becoming very nauseous with oxycodone and has only take one pill. She was encouraged to try 2 tablets and try Ibuprofen in between as well to help with discomfort. She was told to try taking oxycodone with food to help with nausea. Is it possible to have nausea medication sent into pharmacy for patient to tolerate pain medication?

## 2020-08-21 ENCOUNTER — Other Ambulatory Visit: Payer: Self-pay | Admitting: Radiology

## 2020-08-21 DIAGNOSIS — N200 Calculus of kidney: Secondary | ICD-10-CM

## 2020-08-24 ENCOUNTER — Other Ambulatory Visit: Payer: Self-pay | Admitting: Family Medicine

## 2020-08-24 DIAGNOSIS — E781 Pure hyperglyceridemia: Secondary | ICD-10-CM

## 2020-08-26 LAB — CALCULI, WITH PHOTOGRAPH (CLINICAL LAB)
Calcium Oxalate Dihydrate: 10 %
Calcium Oxalate Monohydrate: 30 %
Carbonate Apatite: 10 %
Mg NH4 PO4 (Struvite): 50 %
Weight Calculi: 2735 mg

## 2020-08-28 ENCOUNTER — Other Ambulatory Visit: Payer: Self-pay | Admitting: Family Medicine

## 2020-08-28 DIAGNOSIS — M810 Age-related osteoporosis without current pathological fracture: Secondary | ICD-10-CM

## 2020-09-04 ENCOUNTER — Other Ambulatory Visit: Payer: Self-pay | Admitting: Physician Assistant

## 2020-09-05 ENCOUNTER — Other Ambulatory Visit: Payer: Self-pay

## 2020-09-05 DIAGNOSIS — N200 Calculus of kidney: Secondary | ICD-10-CM

## 2020-09-05 DIAGNOSIS — Z01818 Encounter for other preprocedural examination: Secondary | ICD-10-CM

## 2020-09-08 ENCOUNTER — Other Ambulatory Visit: Payer: PPO

## 2020-09-08 ENCOUNTER — Other Ambulatory Visit: Payer: Self-pay

## 2020-09-08 DIAGNOSIS — N200 Calculus of kidney: Secondary | ICD-10-CM | POA: Diagnosis not present

## 2020-09-08 DIAGNOSIS — Z01818 Encounter for other preprocedural examination: Secondary | ICD-10-CM | POA: Diagnosis not present

## 2020-09-08 LAB — MICROSCOPIC EXAMINATION: WBC, UA: >30 /hpf — AB (ref 0–5)

## 2020-09-08 LAB — URINALYSIS, COMPLETE
Bilirubin, UA: NEGATIVE
Glucose, UA: NEGATIVE
Ketones, UA: NEGATIVE
Nitrite, UA: POSITIVE — AB
Specific Gravity, UA: 1.015 (ref 1.005–1.030)
Urobilinogen, Ur: 0.2 mg/dL (ref 0.2–1.0)
pH, UA: 7.5 (ref 5.0–7.5)

## 2020-09-11 ENCOUNTER — Other Ambulatory Visit
Admission: RE | Admit: 2020-09-11 | Discharge: 2020-09-11 | Disposition: A | Discharge status: Home / Self Care | Payer: PPO | Source: Ambulatory Visit | Attending: Urology | Admitting: Urology

## 2020-09-11 ENCOUNTER — Other Ambulatory Visit: Payer: Self-pay

## 2020-09-11 DIAGNOSIS — Z20822 Contact with and (suspected) exposure to covid-19: Secondary | ICD-10-CM | POA: Diagnosis not present

## 2020-09-11 DIAGNOSIS — Z01812 Encounter for preprocedural laboratory examination: Secondary | ICD-10-CM | POA: Insufficient documentation

## 2020-09-11 LAB — CULTURE, URINE COMPREHENSIVE

## 2020-09-11 LAB — SARS CORONAVIRUS 2 (TAT 6-24 HRS): SARS Coronavirus 2: NEGATIVE

## 2020-09-12 ENCOUNTER — Telehealth: Payer: Self-pay | Admitting: Radiology

## 2020-09-12 DIAGNOSIS — N390 Urinary tract infection, site not specified: Secondary | ICD-10-CM

## 2020-09-12 DIAGNOSIS — N200 Calculus of kidney: Secondary | ICD-10-CM

## 2020-09-12 MED ORDER — SULFAMETHOXAZOLE-TRIMETHOPRIM 800-160 MG PO TABS
1.0000 | ORAL_TABLET | Freq: Two times a day (BID) | ORAL | 0 refills | Status: DC
Start: 2020-09-12 — End: 2020-10-08

## 2020-09-12 NOTE — Telephone Encounter (Signed)
Notified patient of results and script sent to pharmacy. 

## 2020-09-12 NOTE — Telephone Encounter (Signed)
-----   Message from Hollice Espy, MD sent at 09/12/2020  9:36 AM EST ----- Please start bactrim ds bid starting today for total of 7 days  Hollice Espy, MD

## 2020-09-14 MED ORDER — FAMOTIDINE 20 MG PO TABS: 20.0000 mg | ORAL_TABLET | Freq: Once | ORAL | Status: AC

## 2020-09-14 MED ORDER — CEFAZOLIN SODIUM-DEXTROSE 2-4 GM/100ML-% IV SOLN: 2.0000 g | INTRAVENOUS | Status: DC

## 2020-09-14 MED ORDER — LACTATED RINGERS IV SOLN: INTRAVENOUS | Status: DC

## 2020-09-14 MED ORDER — ORAL CARE MOUTH RINSE
15.0000 mL | Freq: Once | OROMUCOSAL | Status: AC
  Administered 2020-09-15: 15 mL via OROMUCOSAL

## 2020-09-14 MED ORDER — CHLORHEXIDINE GLUCONATE 0.12 % MT SOLN: 15.0000 mL | Freq: Once | OROMUCOSAL | Status: AC

## 2020-09-15 ENCOUNTER — Ambulatory Visit: Payer: PPO | Admitting: Anesthesiology

## 2020-09-15 ENCOUNTER — Other Ambulatory Visit: Payer: Self-pay | Admitting: Physician Assistant

## 2020-09-15 ENCOUNTER — Ambulatory Visit
Admission: RE | Admit: 2020-09-15 | Discharge: 2020-09-15 | Disposition: A | Discharge status: Home / Self Care | Payer: PPO | Attending: Urology | Admitting: Urology

## 2020-09-15 ENCOUNTER — Encounter: Payer: Self-pay | Admitting: Urology

## 2020-09-15 ENCOUNTER — Encounter
Admission: RE | Disposition: A | Discharge status: Home / Self Care | Payer: Self-pay | Source: Home / Self Care | Attending: Urology

## 2020-09-15 ENCOUNTER — Ambulatory Visit: Payer: PPO

## 2020-09-15 DIAGNOSIS — Z79899 Other long term (current) drug therapy: Secondary | ICD-10-CM | POA: Insufficient documentation

## 2020-09-15 DIAGNOSIS — Z8616 Personal history of COVID-19: Secondary | ICD-10-CM | POA: Insufficient documentation

## 2020-09-15 DIAGNOSIS — N200 Calculus of kidney: Secondary | ICD-10-CM | POA: Diagnosis not present

## 2020-09-15 DIAGNOSIS — Z7983 Long term (current) use of bisphosphonates: Secondary | ICD-10-CM | POA: Insufficient documentation

## 2020-09-15 DIAGNOSIS — Z466 Encounter for fitting and adjustment of urinary device: Secondary | ICD-10-CM | POA: Diagnosis not present

## 2020-09-15 HISTORY — PX: CYSTOSCOPY/URETEROSCOPY/HOLMIUM LASER/STENT PLACEMENT: SHX6546

## 2020-09-15 SURGERY — CYSTOSCOPY/URETEROSCOPY/HOLMIUM LASER/STENT PLACEMENT
Anesthesia: General | Laterality: Left

## 2020-09-15 MED ORDER — ACETAMINOPHEN 10 MG/ML IV SOLN
INTRAVENOUS | Status: AC
  Filled 2020-09-15: qty 100

## 2020-09-15 MED ORDER — EPHEDRINE 5 MG/ML INJ
INTRAVENOUS | Status: AC
  Filled 2020-09-15: qty 10

## 2020-09-15 MED ORDER — CEFAZOLIN SODIUM-DEXTROSE 2-4 GM/100ML-% IV SOLN
INTRAVENOUS | Status: AC
  Filled 2020-09-15: qty 100

## 2020-09-15 MED ORDER — TAMSULOSIN HCL 0.4 MG PO CAPS: ORAL_CAPSULE | ORAL | 0 refills | Status: DC

## 2020-09-15 MED ORDER — SEVOFLURANE IN SOLN
RESPIRATORY_TRACT | Status: AC
  Filled 2020-09-15: qty 250

## 2020-09-15 MED ORDER — FAMOTIDINE 20 MG PO TABS
ORAL_TABLET | ORAL | Status: AC
  Administered 2020-09-15: 20 mg via ORAL
  Filled 2020-09-15: qty 1

## 2020-09-15 MED ORDER — PROPOFOL 10 MG/ML IV BOLUS
INTRAVENOUS | Status: DC | PRN
  Administered 2020-09-15: 100 mg via INTRAVENOUS

## 2020-09-15 MED ORDER — OXYBUTYNIN CHLORIDE 5 MG PO TABS: 5.0000 mg | ORAL_TABLET | Freq: Three times a day (TID) | ORAL | 1 refills | Status: DC | PRN

## 2020-09-15 MED ORDER — MIDAZOLAM HCL 2 MG/2ML IJ SOLN
INTRAMUSCULAR | Status: DC | PRN
  Administered 2020-09-15: 2 mg via INTRAVENOUS

## 2020-09-15 MED ORDER — PHENYLEPHRINE HCL (PRESSORS) 10 MG/ML IV SOLN
INTRAVENOUS | Status: DC | PRN
  Administered 2020-09-15: 100 ug via INTRAVENOUS
  Administered 2020-09-15: 200 ug via INTRAVENOUS

## 2020-09-15 MED ORDER — ACETAMINOPHEN 10 MG/ML IV SOLN
INTRAVENOUS | Status: DC | PRN
  Administered 2020-09-15: 1000 mg via INTRAVENOUS

## 2020-09-15 MED ORDER — GLYCOPYRROLATE 0.2 MG/ML IJ SOLN
INTRAMUSCULAR | Status: DC | PRN
  Administered 2020-09-15: .2 mg via INTRAVENOUS

## 2020-09-15 MED ORDER — CHLORHEXIDINE GLUCONATE 0.12 % MT SOLN
OROMUCOSAL | Status: AC
  Filled 2020-09-15: qty 15

## 2020-09-15 MED ORDER — CEFAZOLIN SODIUM-DEXTROSE 2-3 GM-%(50ML) IV SOLR
INTRAVENOUS | Status: DC | PRN
  Administered 2020-09-15: 2 g via INTRAVENOUS

## 2020-09-15 MED ORDER — EPHEDRINE SULFATE 50 MG/ML IJ SOLN
INTRAMUSCULAR | Status: DC | PRN
  Administered 2020-09-15: 5 mg via INTRAVENOUS

## 2020-09-15 MED ORDER — FENTANYL CITRATE (PF) 100 MCG/2ML IJ SOLN
INTRAMUSCULAR | Status: DC | PRN
  Administered 2020-09-15: 50 ug via INTRAVENOUS

## 2020-09-15 MED ORDER — MIDAZOLAM HCL 2 MG/2ML IJ SOLN
INTRAMUSCULAR | Status: AC
  Filled 2020-09-15: qty 2

## 2020-09-15 MED ORDER — OXYCODONE-ACETAMINOPHEN 5-325 MG PO TABS: 1.0000 | ORAL_TABLET | Freq: Four times a day (QID) | ORAL | 0 refills | Status: DC | PRN

## 2020-09-15 MED ORDER — ONDANSETRON HCL 4 MG/2ML IJ SOLN
INTRAMUSCULAR | Status: DC | PRN
  Administered 2020-09-15: 4 mg via INTRAVENOUS

## 2020-09-15 MED ORDER — ROCURONIUM BROMIDE 100 MG/10ML IV SOLN
INTRAVENOUS | Status: DC | PRN
  Administered 2020-09-15: 40 mg via INTRAVENOUS

## 2020-09-15 MED ORDER — FENTANYL CITRATE (PF) 100 MCG/2ML IJ SOLN
INTRAMUSCULAR | Status: AC
  Filled 2020-09-15: qty 2

## 2020-09-15 MED ORDER — IOHEXOL 180 MG/ML  SOLN
INTRAMUSCULAR | Status: DC | PRN
  Administered 2020-09-15: 15 mL

## 2020-09-15 MED ORDER — LIDOCAINE HCL (CARDIAC) PF 100 MG/5ML IV SOSY
PREFILLED_SYRINGE | INTRAVENOUS | Status: DC | PRN
  Administered 2020-09-15: 60 mg via INTRAVENOUS

## 2020-09-15 MED ORDER — SUGAMMADEX SODIUM 200 MG/2ML IV SOLN
INTRAVENOUS | Status: DC | PRN
  Administered 2020-09-15: 200 mg via INTRAVENOUS

## 2020-09-15 MED ORDER — DEXAMETHASONE SODIUM PHOSPHATE 10 MG/ML IJ SOLN
INTRAMUSCULAR | Status: DC | PRN
  Administered 2020-09-15: 10 mg via INTRAVENOUS

## 2020-09-15 MED ORDER — FENTANYL CITRATE (PF) 100 MCG/2ML IJ SOLN: 25.0000 ug | INTRAMUSCULAR | Status: DC | PRN

## 2020-09-15 MED ORDER — ONDANSETRON HCL 4 MG/2ML IJ SOLN: 4.0000 mg | Freq: Once | INTRAMUSCULAR | Status: DC | PRN

## 2020-09-15 SURGICAL SUPPLY — 32 items
BAG DRAIN CYSTO-URO LG1000N (MISCELLANEOUS) ×2 IMPLANT
BASKET ZERO TIP 1.9FR (BASKET) IMPLANT
BRUSH SCRUB EZ 1% IODOPHOR (MISCELLANEOUS) ×2 IMPLANT
BSKT STON RTRVL ZERO TP 1.9FR (BASKET)
CATH URET FLEX-TIP 2 LUMEN 10F (CATHETERS) ×2 IMPLANT
CATH URETL 5X70 OPEN END (CATHETERS) ×2 IMPLANT
CNTNR SPEC 2.5X3XGRAD LEK (MISCELLANEOUS)
CONT SPEC 4OZ STER OR WHT (MISCELLANEOUS)
CONT SPEC 4OZ STRL OR WHT (MISCELLANEOUS)
CONTAINER SPEC 2.5X3XGRAD LEK (MISCELLANEOUS) IMPLANT
DRAPE UTILITY 15X26 TOWEL STRL (DRAPES) ×2 IMPLANT
GLOVE SURG ENC MOIS LTX SZ6.5 (GLOVE) ×2 IMPLANT
GOWN STRL REUS W/ TWL LRG LVL3 (GOWN DISPOSABLE) ×2 IMPLANT
GOWN STRL REUS W/TWL LRG LVL3 (GOWN DISPOSABLE) ×4
GUIDEWIRE GREEN .038 145CM (MISCELLANEOUS) ×2 IMPLANT
GUIDEWIRE STR DUAL SENSOR (WIRE) ×2 IMPLANT
INFUSOR MANOMETER BAG 3000ML (MISCELLANEOUS) ×6 IMPLANT
INTRODUCER DILATOR DOUBLE (INTRODUCER) IMPLANT
KIT TURNOVER CYSTO (KITS) ×2 IMPLANT
MANIFOLD NEPTUNE II (INSTRUMENTS) ×2 IMPLANT
PACK CYSTO AR (MISCELLANEOUS) ×2 IMPLANT
SET CYSTO W/LG BORE CLAMP LF (SET/KITS/TRAYS/PACK) ×2 IMPLANT
SHEATH URETERAL 12FRX35CM (MISCELLANEOUS) ×2 IMPLANT
SOL .9 NS 3000ML IRR  AL (IV SOLUTION) ×4
SOL .9 NS 3000ML IRR AL (IV SOLUTION) ×4
SOL .9 NS 3000ML IRR UROMATIC (IV SOLUTION) ×4 IMPLANT
STENT URET 6FRX22 CONTOUR (STENTS) ×2 IMPLANT
STENT URET 6FRX24 CONTOUR (STENTS) IMPLANT
STENT URET 6FRX26 CONTOUR (STENTS) IMPLANT
SURGILUBE 2OZ TUBE FLIPTOP (MISCELLANEOUS) ×2 IMPLANT
TRACTIP FLEXIVA PULSE ID 200 (Laser) ×2 IMPLANT
WATER STERILE IRR 1000ML POUR (IV SOLUTION) ×2 IMPLANT

## 2020-09-15 NOTE — Interval H&P Note (Signed)
History and Physical Interval Note:  09/15/2020 12:11 PM  Gabrielle Barnes  has presented today for surgery, with the diagnosis of left nephrolithiasis.  The various methods of treatment have been discussed with the patient and family. After consideration of risks, benefits and other options for treatment, the patient has consented to  Procedure(s): CYSTOSCOPY/URETEROSCOPY/HOLMIUM LASER/STENT Exchange (Left) as a surgical intervention.  The patient's history has been reviewed, patient examined, no change in status, stable for surgery.  I have reviewed the patient's chart and labs.  Questions were answered to the patient's satisfaction.    Past Medical History:  Diagnosis Date   COVID-19 06/2020   Depression    after husbands death   GERD (gastroesophageal reflux disease)    History of chicken pox    History of kidney stones    History of measles    Hypertension    Migraine     Current Meds  Medication Sig   alendronate (FOSAMAX) 70 MG tablet Take 1 tablet (70 mg total) by mouth once a week. Take with a full glass of water on an empty stomach. (Patient taking differently: Take 70 mg by mouth every Saturday. Take with a full glass of water on an empty stomach.)   Calcium Carb-Cholecalciferol (CALCIUM 600/VITAMIN D3 PO) Take 1 tablet by mouth daily.   dorzolamide-timolol (COSOPT) 22.3-6.8 MG/ML ophthalmic solution Place 1 drop into both eyes 2 (two) times daily.    famotidine (PEPCID) 20 MG tablet TAKE 1 TABLET BY MOUTH TWICE A DAY (Patient taking differently: Take 20 mg by mouth daily as needed for heartburn or indigestion.)   losartan-hydrochlorothiazide (HYZAAR) 50-12.5 MG tablet Take 1 tablet by mouth every morning.   lovastatin (MEVACOR) 40 MG tablet TAKE 2 TABLETS BY MOUTH EVERY EVENING (Patient taking differently: Take 80 mg by mouth every evening.)   sulfamethoxazole-trimethoprim (BACTRIM DS) 800-160 MG tablet Take 1 tablet by mouth every 12 (twelve) hours.   tamsulosin (FLOMAX) 0.4 MG  CAPS capsule TAKE 1 CAPSULE(0.4 MG) BY MOUTH DAILY   [DISCONTINUED] tamsulosin (FLOMAX) 0.4 MG CAPS capsule Take 1 capsule (0.4 mg total) by mouth daily.   Treated with preop bactim for +UCx   Hollice Espy

## 2020-09-15 NOTE — Discharge Instructions (Addendum)
Kidney Stones Kidney stones are rock-like masses that form inside of the kidneys. Kidneys are organs that make pee (urine). A kidney stone may move into other parts of the urinary tract, including:  The tubes that connect the kidneys to the bladder (ureters).  The bladder.  The tube that carries urine out of the body (urethra). Kidney stones can cause very bad pain and can block the flow of pee. The stone usually leaves your body (passes) through your pee. You may need to have a doctor take out the stone. What are the causes? Kidney stones may be caused by:  A condition in which certain glands make too much parathyroid hormone (primary hyperparathyroidism).  A buildup of a type of crystals in the bladder made of a chemical called uric acid. The body makes uric acid when you eat certain foods.  Narrowing (stricture) of one or both of the ureters.  A kidney blockage that you were born with.  Past surgery on the kidney or the ureters, such as gastric bypass surgery. What increases the risk? You are more likely to develop this condition if:  You have had a kidney stone in the past.  You have a family history of kidney stones.  You do not drink enough water.  You eat a diet that is high in protein, salt (sodium), or sugar.  You are overweight or very overweight (obese). What are the signs or symptoms? Symptoms of a kidney stone may include:  Pain in the side of the belly, right below the ribs (flank pain). Pain usually spreads (radiates) to the groin.  Needing to pee often or right away (urgently).  Pain when going pee (urinating).  Blood in your pee (hematuria).  Feeling like you may vomit (nauseous).  Vomiting.  Fever and chills. How is this treated? Treatment depends on the size, location, and makeup of the kidney stones. The stones will often pass out of the body through peeing. You may need to:  Drink more fluid to help pass the stone. In some cases, you may be  given fluids through an IV tube put into one of your veins at the hospital.  Take medicine for pain.  Make changes in your diet to help keep kidney stones from coming back. Sometimes, medical procedures are needed to remove a kidney stone. This may involve:  A procedure to break up kidney stones using a beam of light (laser) or shock waves.  Surgery to remove the kidney stones. Follow these instructions at home: Medicines  Take over-the-counter and prescription medicines only as told by your doctor.  Ask your doctor if the medicine prescribed to you requires you to avoid driving or using heavy machinery. Eating and drinking  Drink enough fluid to keep your pee pale yellow. You may be told to drink at least 8-10 glasses of water each day. This will help you pass the stone.  If told by your doctor, change your diet. This may include: ? Limiting how much salt you eat. ? Eating more fruits and vegetables. ? Limiting how much meat, poultry, fish, and eggs you eat.  Follow instructions from your doctor about eating or drinking restrictions. General instructions  Collect pee samples as told by your doctor. You may need to collect a pee sample: ? 24 hours after a stone comes out. ? 8-12 weeks after a stone comes out, and every 6-12 months after that.  Strain your pee every time you pee (urinate), for as long as told. Use  the strainer that your doctor recommends.  Do not throw out the stone. Keep it so that it can be tested by your doctor.  Keep all follow-up visits as told by your doctor. This is important. You may need follow-up tests. How is this prevented? To prevent another kidney stone:  Drink enough fluid to keep your pee pale yellow. This is the best way to prevent kidney stones.  Eat healthy foods.  Avoid certain foods as told by your doctor. You may be told to eat less protein.  Stay at a healthy weight.   Where to find more information  [place]  (NK[facility]): www.kidney.[facility]): www.urologyhealth.org Contact a doctor if:  You have pain that gets worse or does not get better with medicine. Get help right away if:  You have a fever or chills.  You get very bad pain.  You get new pain in your belly (abdomen).  You pass out (faint).  You cannot pee. Summary  Kidney stones are rock-like masses that form inside of the kidneys.  Kidney stones can cause very bad pain and can block the flow of pee.  The stones will often pass out of the body through peeing.  Drink enough fluid to keep your pee pale yellow. This information is not intended to replace advice given to you by your health care provider. Make sure you discuss any questions you have with your health care provider. Document Revised: 12/12/2018 Document Reviewed: 12/12/2018 Elsevier Patient Education  [place].   Kidney Stones Kidney stones are rock-like masses that form inside of the kidneys. Kidneys are organs that make pee (urine). A kidney stone may move into other parts of the urinary tract, including:  The tubes that connect the kidneys to the bladder (ureters).  The bladder.  The tube that carries urine out of the body (urethra). Kidney stones can cause very bad pain and can block the flow of pee. The stone usually leaves your body (passes) through your pee. You may need to have a doctor take out the stone. What are the causes? Kidney stones may be caused by:  A condition in which certain glands make too much parathyroid hormone (primary hyperparathyroidism).  A buildup of a type of crystals in the bladder made of a chemical called uric acid. The body makes uric acid when you eat certain foods.  Narrowing (stricture) of one or both of the ureters.  A kidney blockage that you were born with.  Past surgery on the kidney or the ureters, such as gastric bypass surgery. What increases the risk? You are more likely to develop  this condition if:  You have had a kidney stone in the past.  You have a family history of kidney stones.  You do not drink enough water.  You eat a diet that is high in protein, salt (sodium), or sugar.  You are overweight or very overweight (obese). What are the signs or symptoms? Symptoms of a kidney stone may include:  Pain in the side of the belly, right below the ribs (flank pain). Pain usually spreads (radiates) to the groin.  Needing to pee often or right away (urgently).  Pain when going pee (urinating).  Blood in your pee (hematuria).  Feeling like you may vomit (nauseous).  Vomiting.  Fever and chills. How is this treated? Treatment depends on the size, location, and makeup of the kidney stones. The stones will often pass out of the body through peeing.  You may need to:  Drink more fluid to help pass the stone. In some cases, you may be given fluids through an IV tube put into one of your veins at the hospital.  Take medicine for pain.  Make changes in your diet to help keep kidney stones from coming back. Sometimes, medical procedures are needed to remove a kidney stone. This may involve:  A procedure to break up kidney stones using a beam of light (laser) or shock waves.  Surgery to remove the kidney stones. Follow these instructions at home: Medicines  Take over-the-counter and prescription medicines only as told by your doctor.  Ask your doctor if the medicine prescribed to you requires you to avoid driving or using heavy machinery. Eating and drinking  Drink enough fluid to keep your pee pale yellow. You may be told to drink at least 8-10 glasses of water each day. This will help you pass the stone.  If told by your doctor, change your diet. This may include: ? Limiting how much salt you eat. ? Eating more fruits and vegetables. ? Limiting how much meat, poultry, fish, and eggs you eat.  Follow instructions from your doctor about eating or  drinking restrictions. General instructions  Collect pee samples as told by your doctor. You may need to collect a pee sample: ? 24 hours after a stone comes out. ? 8-12 weeks after a stone comes out, and every 6-12 months after that.  Strain your pee every time you pee (urinate), for as long as told. Use the strainer that your doctor recommends.  Do not throw out the stone. Keep it so that it can be tested by your doctor.  Keep all follow-up visits as told by your doctor. This is important. You may need follow-up tests. How is this prevented? To prevent another kidney stone:  Drink enough fluid to keep your pee pale yellow. This is the best way to prevent kidney stones.  Eat healthy foods.  Avoid certain foods as told by your doctor. You may be told to eat less protein.  Stay at a healthy weight.   Where to find more information  [place] ([facility]): www.kidney.[facility]): www.urologyhealth.org Contact a doctor if:  You have pain that gets worse or does not get better with medicine. Get help right away if:  You have a fever or chills.  You get very bad pain.  You get new pain in your belly (abdomen).  You pass out (faint).  You cannot pee. Summary  Kidney stones are rock-like masses that form inside of the kidneys.  Kidney stones can cause very bad pain and can block the flow of pee.  The stones will often pass out of the body through peeing.  Drink enough fluid to keep your pee pale yellow. This information is not intended to replace advice given to you by your health care provider. Make sure you discuss any questions you have with your health care provider. Document Revised: 12/12/2018 Document Reviewed: 12/12/2018 Elsevier Patient Education  2021 [place] have a ureteral stent in place.  This is a tube that extends from your kidney to your bladder.  This may cause urinary bleeding, burning with urination, and  urinary frequency.  Please call our office or present to the ED if you develop fevers >101 or pain which is not able to be controlled with oral pain medications.  You may be given either Flomax and/ or ditropan to help  with bladder spasms and stent pain in addition to pain medications.    [facility] [address] [place], [place] [address] [phone]    AMBULATORY SURGERY  DISCHARGE INSTRUCTIONS   1) The drugs that you were given will stay in your system until tomorrow so for the next 24 hours you should not:  A) Drive an automobile B) Make any legal decisions C) Drink any alcoholic beverage   2) You may resume regular meals tomorrow.  Today it is better to start with liquids and gradually work up to solid foods.  You may eat anything you prefer, but it is better to start with liquids, then soup and crackers, and gradually work up to solid foods.   3) Please notify your doctor immediately if you have any unusual bleeding, trouble breathing, redness and pain at the surgery site, drainage, fever, or pain not relieved by medication.    4) Additional Instructions:        Please contact your physician with any problems or Same Day Surgery at [phone], Monday through Friday 6 am to 4 pm, or [place] at [facility] number at [phone].

## 2020-09-15 NOTE — Anesthesia Procedure Notes (Addendum)
Procedure Name: Intubation Performed by: Fletcher-Harrison, Shannyn Jankowiak, CRNA Pre-anesthesia Checklist: Patient identified, Emergency Drugs available, Suction available and Patient being monitored Patient Re-evaluated:Patient Re-evaluated prior to induction Oxygen Delivery Method: Circle system utilized Preoxygenation: Pre-oxygenation with 100% oxygen Induction Type: IV induction Ventilation: Mask ventilation without difficulty Laryngoscope Size: McGraph and 3 Grade View: Grade I Tube type: Oral Tube size: 6.5 mm Number of attempts: 1 Airway Equipment and Method: Stylet and Oral airway Placement Confirmation: ETT inserted through vocal cords under direct vision,  positive ETCO2,  breath sounds checked- equal and bilateral and CO2 detector Secured at: 21 cm Tube secured with: Tape Dental Injury: Teeth and Oropharynx as per pre-operative assessment        

## 2020-09-15 NOTE — Anesthesia Preprocedure Evaluation (Addendum)
Anesthesia Evaluation  Patient identified by MRN, date of birth, ID band Patient awake    Reviewed: Allergy & Precautions, NPO status , Patient's Chart, lab work & pertinent test results  History of Anesthesia Complications Negative for: history of anesthetic complications  Airway Mallampati: II       Dental   Pulmonary neg sleep apnea, neg COPD, Not current smoker,           Cardiovascular hypertension, Pt. on medications (-) Past MI and (-) CHF (-) dysrhythmias (-) Valvular Problems/Murmurs     Neuro/Psych neg Seizures Depression    GI/Hepatic Neg liver ROS, GERD  Medicated,  Endo/Other  neg diabetes  Renal/GU Renal disease (stones)     Musculoskeletal   Abdominal   Peds  Hematology   Anesthesia Other Findings   Reproductive/Obstetrics                            Anesthesia Physical Anesthesia Plan  ASA: II  Anesthesia Plan: General   Post-op Pain Management:    Induction: Intravenous  PONV Risk Score and Plan: 3 and Ondansetron and Dexamethasone  Airway Management Planned: Oral ETT  Additional Equipment:   Intra-op Plan:   Post-operative Plan:   Informed Consent: I have reviewed the patients History and Physical, chart, labs and discussed the procedure including the risks, benefits and alternatives for the proposed anesthesia with the patient or authorized representative who has indicated his/her understanding and acceptance.       Plan Discussed with:   Anesthesia Plan Comments:         Anesthesia Quick Evaluation

## 2020-09-15 NOTE — Op Note (Signed)
Date of procedure: 09/15/20  Preoperative diagnosis:  1. Bilateral staghorn calculi  Postoperative diagnosis:  1. Same as above  Procedure: 1. Left ureteroscopy with laser lithotripsy 2. Left ureteral stent exchange 3. Left retrograde pyelogram  Surgeon: Hollice Espy, MD  Anesthesia: General  Complications: None  Intraoperative findings: Extensive residual stone burden involving mid posterior and upper pole, fairly stenotic infundibulum into this area.  Ultimately, all stone fragmented but significant residual debris.  Greater than 2.5 hours laser time.  EBL: Minimal  Specimens: None   Drains: 6 x 22 French double-J ureteral stent on left  Indication: Gabrielle Barnes is a 70 y.o. patient with bilateral staghorn calculi who underwent treatment of her left lower pole stone burden via PCNL.  Unfortunately, due to a complex collecting system with a stenotic infundibulum into the upper pole moiety, the stone was unable to treated and she returns today for staged procedure to address this fairly significant residual stone burden.  After reviewing the management options for treatment, she elected to proceed with the above surgical procedure(s). We have discussed the potential benefits and risks of the procedure, side effects of the proposed treatment, the likelihood of the patient achieving the goals of the procedure, and any potential problems that might occur during the procedure or recuperation. Informed consent has been obtained.  Description of procedure:  The patient was taken to the operating room and general anesthesia was induced.  The patient was placed in the dorsal lithotomy position, prepped and draped in the usual sterile fashion, and preoperative antibiotics were administered. A preoperative time-out was performed.   A 21 French scope was advanced per urethra into the bladder.  Attention was turned to the left ureter orifice from which a ureteral stent was seen emanating.   The distal coil of the stent was grasped and brought to level urethral meatus.  I was unfortunately not able to cannulate the stent as it was fairly encrusted.  He ended up removing it and then returning back into the bladder with the cystoscope at which time I was able to advance the wire up to the level of the kidney.  I then used a dual-lumen access sheath to perform a retrograde pyelogram which showed some very subtle filling defects throughout the length of the ureter but thinks catheter went very easily to the level of the mid ureter.  A Super Stiff wire was then advanced up to the level of the kidney as a working wire.  Given that the dual-lumen sheath advanced so easily, elected to place a ureteral access sheath, a [name] 12/14 [place].  This advanced very easily under fluoroscopic guidance up to the level of the proximal ureter under fluoroscopy.  The inner lumen and wire were removed.  I then advanced an 8 [place] digital dual-lumen flexible ureteroscope into the renal pelvis.  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  This likely accounted for the filling defects.  The renal pelvis and lower pole moieties were clear of stone except for a small punctate stone which appeared to be at the previous access site adherent to the mucosa.  Initially, I had some difficulty identifying the upper tract collecting system but ultimately found a very narrow infundibulum which had to be lasered slightly in order to open it where the entire compound calyces were identified and completely filled with stone.  This involved 9 or so calyces.  These were completely filled with stone.  This represented a partial staghorn calculus.  Use  a laser 42 m initially with settings of 0.2 J and 40 Hz and then later on 1 to 1.2 J and 10 Hz to completely fragment the stone.  Laser time was greater than 2 hours, approximately 2.5 hours.  Ultimately, I was able to fragment all stones however there was a very large  overall fragmented stone burden which most of which are relatively small but similar somewhat larger visualization at this point with suboptimal this year amount of debris, small amount of bleeding and overall cloudiness.  A final retrograde pyelogram was performed which did not fill the entire collecting system without contrast extravasation or hydronephrosis.  There were no longer filling defects in the complex upper pole collecting system.  I then backed the scope down the length of the ureter inspecting along the way and removing the access sheath.  As previously noted, ureteritis was noted throughout the length of the ureter but there is no other additional pathology.  A 6 x 22 French double-J ureteral stent was advanced over the sensor wire fluoroscopically and with the wire withdrawn, focal was noted both within the bladder as well as within the kidney.  The bladder was drained.  The patient was then cleaned and dried, repositioned in the supine position, reversed myesthesia, and taken the back in stable condition.  Plan: Given her overall fragmented stone burden, we will have her follow-up in clinic in about 2 weeks with a KUB to assess how much of the stone material she is able to pass spontaneously with a stent in place.  She may require one last additional procedure to completely clear this left-sided system prior to addressing the right side.  She is agreeable this plan.  Hollice Espy, M.D.

## 2020-09-16 ENCOUNTER — Encounter: Payer: Self-pay | Admitting: Urology

## 2020-09-16 NOTE — Transfer of Care (Signed)
Immediate Anesthesia Transfer of Care Note  Patient: Gabrielle Barnes  Procedure(s) Performed: CYSTOSCOPY/URETEROSCOPY/HOLMIUM LASER/STENT Exchange (Left )  Patient Location: PACU  Anesthesia Type:General  Level of Consciousness: drowsy and patient cooperative  Airway & Oxygen Therapy: Patient Spontanous Breathing  Post-op Assessment: Report given to RN and Post -op Vital signs reviewed and stable  Post vital signs: Reviewed and stable  Last Vitals:  Vitals Value Taken Time  BP 150/70 09/15/20 1736  Temp 36.8 C 09/15/20 1736  Pulse 82 09/15/20 1736  Resp 16 09/15/20 1736  SpO2 98 % 09/15/20 1736    Last Pain:  Vitals:   09/15/20 1648  TempSrc: Tympanic  PainSc: 0-No pain         Complications: No complications documented.

## 2020-09-18 ENCOUNTER — Other Ambulatory Visit: Payer: Self-pay | Admitting: Radiology

## 2020-09-18 DIAGNOSIS — N200 Calculus of kidney: Secondary | ICD-10-CM

## 2020-09-18 NOTE — Anesthesia Postprocedure Evaluation (Signed)
Anesthesia Post Note  Patient: Nyoka Cowden  Procedure(s) Performed: CYSTOSCOPY/URETEROSCOPY/HOLMIUM LASER/STENT Exchange (Left )  Patient location during evaluation: PACU Anesthesia Type: General Level of consciousness: awake and alert Pain management: pain level controlled Vital Signs Assessment: post-procedure vital signs reviewed and stable Respiratory status: spontaneous breathing, nonlabored ventilation and respiratory function stable Cardiovascular status: blood pressure returned to baseline and stable Postop Assessment: no apparent nausea or vomiting Anesthetic complications: no   No complications documented.   Last Vitals:  Vitals:   09/15/20 1648 09/15/20 1736  BP: 140/68 (!) 150/70  Pulse: 76 82  Resp: 12 16  Temp: (!) 36.1 C 36.8 C  SpO2: 99% 98%    Last Pain:  Vitals:   09/16/20 0837  TempSrc:   PainSc: 2                  Alphonsus Sias

## 2020-09-23 ENCOUNTER — Encounter: Payer: PPO | Admitting: Urology

## 2020-09-25 ENCOUNTER — Other Ambulatory Visit: Payer: Self-pay | Admitting: Urology

## 2020-09-25 ENCOUNTER — Telehealth: Payer: Self-pay

## 2020-09-25 MED ORDER — FLUCONAZOLE 100 MG PO TABS
100.0000 mg | ORAL_TABLET | Freq: Every day | ORAL | 0 refills | Status: DC
Start: 2020-09-25 — End: 2020-10-08

## 2020-09-25 NOTE — Progress Notes (Signed)
Patient called with possible thrush.  She states so severe that it hurts to eat and drink.  I sent a prescription in for fluconazole 100 mg, to take 2 tablets day 1 and then 1 a day for 7 days.  She will then present for an appointment either on Monday or Tuesday so that we can evaluate her in the office.  She is instructed to contact PCP or seek treatment in the ED if her symptoms become worse.  She is also advised not to take her Mevacor while on the fluconazole.  She has an appointment with Dr. Erlene Quan on Wednesday, so she will keep that appointment.

## 2020-09-25 NOTE — Telephone Encounter (Signed)
Patient called triage line complaining of her tongue being white with tenderness and having difficulty eating due to pain. Patient has been on Bactrim as prescribed from our office. Pt believes it is from the antibiotic. As Per PA shannon,, called in diflucan to take as prescribed, and also stop her cholesterol medication. Larene Beach wanted pt to follow up, pt has an appointment with Dr. Erlene Quan on 10/01/2020 as per Larene Beach OK to keep that appointment. Patient verbalized understanding. And will pick up medication and DC cholesterol medication while taking diflucan.

## 2020-09-26 ENCOUNTER — Other Ambulatory Visit: Payer: Self-pay | Admitting: Family Medicine

## 2020-09-26 ENCOUNTER — Encounter: Payer: Self-pay | Admitting: Urology

## 2020-09-26 DIAGNOSIS — F5102 Adjustment insomnia: Secondary | ICD-10-CM

## 2020-09-26 DIAGNOSIS — F321 Major depressive disorder, single episode, moderate: Secondary | ICD-10-CM

## 2020-09-30 ENCOUNTER — Telehealth: Payer: Self-pay | Admitting: Urology

## 2020-09-30 DIAGNOSIS — B370 Candidal stomatitis: Secondary | ICD-10-CM

## 2020-09-30 NOTE — Telephone Encounter (Signed)
Contacted patient to reschedule appt for 2/23.  She wanted to talk to you about the thrush that she still has on her tongue.  She has been taking the prescribed medicine since last Friday and only seen a little improvement.  Her tongue is white and she in unable to taste.  Any recommendations?

## 2020-10-01 ENCOUNTER — Ambulatory Visit: Payer: PPO | Admitting: Urology

## 2020-10-01 ENCOUNTER — Other Ambulatory Visit: Payer: Self-pay | Admitting: Urology

## 2020-10-01 MED ORDER — NYSTATIN 100000 UNIT/ML MT SUSP: 5.0000 mL | Freq: Three times a day (TID) | OROMUCOSAL | 0 refills | Status: DC | PRN

## 2020-10-01 NOTE — Telephone Encounter (Signed)
Patient informed, voiced understanding.  °

## 2020-10-01 NOTE — Telephone Encounter (Signed)
I sent a prescription for Magic mouthwash to [facility].  It is a swish and spit for comfort.  The oral medication Diflucan is the most appropriate medication but the Magic mouthwash is meant to ease pain and does have some antifungal in it.  I generally have not used this in the outpatient setting so it may be an issue getting it to [place].  Let us know if you have any problems.  Hollice Espy, MD

## 2020-10-07 ENCOUNTER — Ambulatory Visit
Admission: RE | Admit: 2020-10-07 | Discharge: 2020-10-07 | Disposition: A | Discharge status: Home / Self Care | Payer: PPO | Attending: Urology | Admitting: Urology

## 2020-10-07 ENCOUNTER — Ambulatory Visit
Admission: RE | Admit: 2020-10-07 | Discharge: 2020-10-07 | Disposition: A | Discharge status: Home / Self Care | Payer: PPO | Source: Ambulatory Visit | Attending: Urology | Admitting: Urology

## 2020-10-07 ENCOUNTER — Other Ambulatory Visit: Payer: Self-pay

## 2020-10-07 DIAGNOSIS — N200 Calculus of kidney: Secondary | ICD-10-CM | POA: Insufficient documentation

## 2020-10-07 DIAGNOSIS — Z960 Presence of urogenital implants: Secondary | ICD-10-CM | POA: Diagnosis not present

## 2020-10-08 ENCOUNTER — Other Ambulatory Visit: Payer: Self-pay | Admitting: Radiology

## 2020-10-08 ENCOUNTER — Encounter: Payer: Self-pay | Admitting: Urology

## 2020-10-08 ENCOUNTER — Ambulatory Visit (INDEPENDENT_AMBULATORY_CARE_PROVIDER_SITE_OTHER): Payer: PPO | Admitting: Urology

## 2020-10-08 VITALS — BP 142/90 | HR 71 | Wt 135.0 lb

## 2020-10-08 DIAGNOSIS — N200 Calculus of kidney: Secondary | ICD-10-CM | POA: Diagnosis not present

## 2020-10-08 DIAGNOSIS — N201 Calculus of ureter: Secondary | ICD-10-CM

## 2020-10-08 DIAGNOSIS — B370 Candidal stomatitis: Secondary | ICD-10-CM

## 2020-10-08 NOTE — Progress Notes (Signed)
10/08/2020 9:30 AM   Gabrielle Barnes 11/03/1950 [id number]  Referring provider: Rusty Aus, MD [facility] [place],  [place] [address]  Chief Complaint  Patient presents with  . staghorn calculus    HPI: 70 year old female with bilateral full staghorn nephrolithiasis who returns today to reassess her left-sided stone burden.  She underwent left PCNL on 08/18/2020.  On that occasion, were able to address her lower pole stone burden but due to stenotic upper pole infundibulum, were unable to treat a large portion of her stone.  She returned to the operating room on 09/15/2020 for staged ureteroscopic procedure.  At that occasion, clinically all of the stone was fragmented but there was still a large fragmented stone burden due to the sheer volume of stone.  In the interim, she denies passing any significant stones or debris.  She is tolerating the stent reasonably well with some urinary frequency.  She is accompanied today by her daughter.  She still has a full staghorn on the right which is yet to be addressed.  She has been struggling with thrush.  She was treated with fluconazole and more recently Magic mouthwash.  She reports that the Magic mouthwash has made the biggest difference.  She still has a little bit of whitish plaque at her posterior tongue on exam today.   PMH: Past Medical History:  Diagnosis Date  . COVID-19 06/2020  . Depression    after husbands death  . GERD (gastroesophageal reflux disease)   . History of chicken pox   . History of kidney stones   . History of measles   . Hypertension   . Migraine     Surgical History: Past Surgical History:  Procedure Laterality Date  . ABDOMINAL HYSTERECTOMY    . APPENDECTOMY    . CESAREAN SECTION    . colonoscopy    . CYSTOSCOPY WITH HOLMIUM LASER LITHOTRIPSY  08/18/2020   Procedure: HOLMIUM LASER LITHOTRIPSY;  Surgeon: Hollice Espy, MD;  Location: [facility];   Service: Urology;;  . CYSTOSCOPY/URETEROSCOPY/HOLMIUM LASER/STENT PLACEMENT Left 09/15/2020   Procedure: CYSTOSCOPY/URETEROSCOPY/HOLMIUM LASER/STENT Exchange;  Surgeon: Hollice Espy, MD;  Location: [facility];  Service: Urology;  Laterality: Left;  . IR NEPHROSTOMY PLACEMENT LEFT  08/18/2020  . NEPHROLITHOTOMY Left 08/18/2020   Procedure: NEPHROLITHOTOMY PERCUTANEOUS;  Surgeon: Hollice Espy, MD;  Location: [facility];  Service: Urology;  Laterality: Left;  possibly appropriate for [place] 3B  . XI ROBOTIC LAPAROSCOPIC ASSISTED APPENDECTOMY Right 06/13/2020   Procedure: XI ROBOTIC LAPAROSCOPIC ASSISTED APPENDECTOMY, RIGHT COLECTOMY;  Surgeon: Ronny Bacon, MD;  Location: [facility];  Service: General;  Laterality: Right;    Home Medications:  Allergies as of 10/08/2020   No Known Allergies     Medication List       Accurate as of October 08, 2020 11:59 PM. If you have any questions, ask your nurse or doctor.        STOP taking these medications   fluconazole 100 MG tablet Commonly known as: DIFLUCAN Stopped by: Hollice Espy, MD   ondansetron 4 MG tablet Commonly known as: Zofran Stopped by: Hollice Espy, MD   oxybutynin 5 MG tablet Commonly known as: DITROPAN Stopped by: Hollice Espy, MD   oxyCODONE-acetaminophen 5-325 MG tablet Commonly known as: PERCOCET/ROXICET Stopped by: Hollice Espy, MD   sulfamethoxazole-trimethoprim 800-160 MG tablet Commonly known as: BACTRIM DS Stopped by: Hollice Espy, MD   tamsulosin 0.4 MG Caps capsule Commonly known as: FLOMAX Stopped by: Hollice Espy,  MD     TAKE these medications   alendronate 70 MG tablet Commonly known as: FOSAMAX Take 1 tablet (70 mg total) by mouth once a week. Take with a full glass of water on an empty stomach. What changed: when to take this   CALCIUM 600/VITAMIN D3 PO Take 1 tablet by mouth daily.   diphenoxylate-atropine 2.5-0.025 MG tablet Commonly known as: LOMOTIL Take 1 tablet by mouth 4 (four)  times daily as needed for diarrhea or loose stools.   docusate sodium 100 MG capsule Commonly known as: COLACE Take 1 capsule (100 mg total) by mouth 2 (two) times daily.   dorzolamide-timolol 22.3-6.8 MG/ML ophthalmic solution Commonly known as: COSOPT Place 1 drop into both eyes 2 (two) times daily.   famotidine 20 MG tablet Commonly known as: PEPCID TAKE 1 TABLET BY MOUTH TWICE A DAY What changed:   how much to take  when to take this  reasons to take this   ibuprofen 800 MG tablet Commonly known as: ADVIL Take 1 tablet (800 mg total) by mouth every 8 (eight) hours as needed.   losartan-hydrochlorothiazide 50-12.5 MG tablet Commonly known as: HYZAAR Take 1 tablet by mouth every morning.   lovastatin 40 MG tablet Commonly known as: MEVACOR Take 40 mg by mouth 2 (two) times daily.   magic mouthwash (nystatin, hydrocortisone, diphenhydrAMINE) suspension Swish and spit 5 mLs 3 (three) times daily as needed for mouth pain.   traZODone 100 MG tablet Commonly known as: DESYREL TAKE 1 TABLET BY MOUTH EVERY NIGHT AT BEDTIME       Allergies: No Known Allergies  Family History: Family History  Problem Relation Age of Onset  . Dementia Mother   . Stroke Father   . Hypertension Father   . Cancer Neg Hx   . Diabetes Neg Hx     Social History:  reports that she has never smoked. She has never used smokeless tobacco. She reports that she does not drink alcohol and does not use drugs.   Physical Exam: BP (!) 142/90   Pulse 71   Wt 135 lb (61.2 kg)   BMI 26.37 kg/m   Constitutional:  Alert and oriented, No acute distress.  Accompanied by daughter today. HEENT: [place] AT, moist mucus membranes.  Trachea midline, no masses. Skin: No rashes, bruises or suspicious lesions. Neurologic: Grossly intact, no focal deficits, moving all 4 extremities. Psychiatric: Normal mood and affect.  Urinalysis Results for orders placed or performed in visit on 10/08/20  CULTURE, URINE  COMPREHENSIVE   Specimen: Urine   UR  Result Value Ref Range   Urine Culture, Comprehensive Preliminary report (A)    Organism ID, Bacteria Gram negative rods (A)   Microscopic Examination   Urine  Result Value Ref Range   WBC, UA >30 (A) 0 - 5 /hpf   RBC 11-30 (A) 0 - 2 /hpf   Epithelial Cells (non renal) 0-10 0 - 10 /hpf   Renal Epithel, UA 0-10 (A) None seen /hpf   Bacteria, UA Many (A) None seen/Few  Urinalysis, Complete  Result Value Ref Range   Specific Gravity, UA 1.015 1.005 - 1.030   pH, UA 7.0 5.0 - 7.5   Color, UA Yellow Yellow   Appearance Ur Cloudy (A) Clear   Leukocytes,UA 3+ (A) Negative   Protein,UA 1+ (A) Negative/Trace   Glucose, UA Negative Negative   Ketones, UA Negative Negative   RBC, UA 2+ (A) Negative   Bilirubin, UA Negative Negative  Urobilinogen, Ur 0.2 0.2 - 1.0 mg/dL   Nitrite, UA Negative Negative   Microscopic Examination See below:      Pertinent Imaging:  Abdomen 1 view (KUB)  Narrative CLINICAL DATA:  Bilateral staghorn calculi  EXAM: ABDOMEN - 1 VIEW  COMPARISON:  CT 05/02/2020  FINDINGS: Large staghorn calculus noted in the right kidney. Numerous stones noted on the left, the largest 2.7 cm in the midpole region. Overall, the staghorn calculus on the left is decreased since prior CT. Left ureteral stent is in place. The proximal aspect of the ureteral stent is below the renal pelvis. Nonobstructive bowel gas pattern.  IMPRESSION: Bilateral nephrolithiasis with large staghorn calculus on the right and multiple calcifications on the left, the largest 2.7 cm.  Proximal aspect of left ureteral stent in the proximal left ureter.   Electronically Signed By: Rolm Baptise M.D. On: 10/08/2020 08:26  KUB personally reviewed today.  Residual stone burden on the study within an upper pole calyx with stenotic infundibulum.  Clinically, this was fragmented intraoperatively.  Suspect this is a large volume debris which is unable to  pass through the stenotic infundibulum.  Assessment & Plan:    1. Left nephrolithiasis Residual fairly significant left upper pole stone burden, suspect this is a large amount of debris/dust within an essentially obstructed calyx with an extremely stenotic infundibulum  I recommended returning to the operating room to open up this infundibulum and get the residual stone burden.  We will likely be able to accomplish this on 1 last treatment.  We discussed ureteroscopy again at length including risk of bleeding, infection, damage to any structures and infectious complications primarily.  She is chronically colonized with bacteria thus will likely pretreat her as on previous occasions to reduce her bacterial load.   2. Staghorn calculus Plan for right PCNL in the future, likely with multiple access in order to optimize stone clearance - Urinalysis, Complete - CULTURE, URINE COMPREHENSIVE  3. Thrush Continue to use Magic mouthwash as needed, improving  She asked if we could try to avoid Bactrim perioperatively in the future, thinks this particular antibiotic predisposes her to thrush more so than other antibiotics   Hollice Espy, MD  [facility] [address] [place] [employer], [place] [address] [phone]

## 2020-10-08 NOTE — H&P (View-Only) (Signed)
10/08/2020 9:30 AM   Gabrielle Barnes Apr 29, 1951 [id number]  Referring provider: Rusty Aus, MD [facility] [place],  [place] [address]  Chief Complaint  Patient presents with  . staghorn calculus    HPI: 70 year old female with bilateral full staghorn nephrolithiasis who returns today to reassess her left-sided stone burden.  She underwent left PCNL on 08/18/2020.  On that occasion, were able to address her lower pole stone burden but due to stenotic upper pole infundibulum, were unable to treat a large portion of her stone.  She returned to the operating room on 09/15/2020 for staged ureteroscopic procedure.  At that occasion, clinically all of the stone was fragmented but there was still a large fragmented stone burden due to the sheer volume of stone.  In the interim, she denies passing any significant stones or debris.  She is tolerating the stent reasonably well with some urinary frequency.  She is accompanied today by her daughter.  She still has a full staghorn on the right which is yet to be addressed.  She has been struggling with thrush.  She was treated with fluconazole and more recently Magic mouthwash.  She reports that the Magic mouthwash has made the biggest difference.  She still has a little bit of whitish plaque at her posterior tongue on exam today.   PMH: Past Medical History:  Diagnosis Date  . COVID-19 06/2020  . Depression    after husbands death  . GERD (gastroesophageal reflux disease)   . History of chicken pox   . History of kidney stones   . History of measles   . Hypertension   . Migraine     Surgical History: Past Surgical History:  Procedure Laterality Date  . ABDOMINAL HYSTERECTOMY    . APPENDECTOMY    . CESAREAN SECTION    . colonoscopy    . CYSTOSCOPY WITH HOLMIUM LASER LITHOTRIPSY  08/18/2020   Procedure: HOLMIUM LASER LITHOTRIPSY;  Surgeon: Hollice Espy, MD;  Location: [facility];   Service: Urology;;  . CYSTOSCOPY/URETEROSCOPY/HOLMIUM LASER/STENT PLACEMENT Left 09/15/2020   Procedure: CYSTOSCOPY/URETEROSCOPY/HOLMIUM LASER/STENT Exchange;  Surgeon: Hollice Espy, MD;  Location: [facility];  Service: Urology;  Laterality: Left;  . IR NEPHROSTOMY PLACEMENT LEFT  08/18/2020  . NEPHROLITHOTOMY Left 08/18/2020   Procedure: NEPHROLITHOTOMY PERCUTANEOUS;  Surgeon: Hollice Espy, MD;  Location: [facility];  Service: Urology;  Laterality: Left;  possibly appropriate for [place] 3B  . XI ROBOTIC LAPAROSCOPIC ASSISTED APPENDECTOMY Right 06/13/2020   Procedure: XI ROBOTIC LAPAROSCOPIC ASSISTED APPENDECTOMY, RIGHT COLECTOMY;  Surgeon: Ronny Bacon, MD;  Location: [facility];  Service: General;  Laterality: Right;    Home Medications:  Allergies as of 10/08/2020   No Known Allergies     Medication List       Accurate as of October 08, 2020 11:59 PM. If you have any questions, ask your nurse or doctor.        STOP taking these medications   fluconazole 100 MG tablet Commonly known as: DIFLUCAN Stopped by: Hollice Espy, MD   ondansetron 4 MG tablet Commonly known as: Zofran Stopped by: Hollice Espy, MD   oxybutynin 5 MG tablet Commonly known as: DITROPAN Stopped by: Hollice Espy, MD   oxyCODONE-acetaminophen 5-325 MG tablet Commonly known as: PERCOCET/ROXICET Stopped by: Hollice Espy, MD   sulfamethoxazole-trimethoprim 800-160 MG tablet Commonly known as: BACTRIM DS Stopped by: Hollice Espy, MD   tamsulosin 0.4 MG Caps capsule Commonly known as: FLOMAX Stopped by: Hollice Espy,  MD     TAKE these medications   alendronate 70 MG tablet Commonly known as: FOSAMAX Take 1 tablet (70 mg total) by mouth once a week. Take with a full glass of water on an empty stomach. What changed: when to take this   CALCIUM 600/VITAMIN D3 PO Take 1 tablet by mouth daily.   diphenoxylate-atropine 2.5-0.025 MG tablet Commonly known as: LOMOTIL Take 1 tablet by mouth 4 (four)  times daily as needed for diarrhea or loose stools.   docusate sodium 100 MG capsule Commonly known as: COLACE Take 1 capsule (100 mg total) by mouth 2 (two) times daily.   dorzolamide-timolol 22.3-6.8 MG/ML ophthalmic solution Commonly known as: COSOPT Place 1 drop into both eyes 2 (two) times daily.   famotidine 20 MG tablet Commonly known as: PEPCID TAKE 1 TABLET BY MOUTH TWICE A DAY What changed:   how much to take  when to take this  reasons to take this   ibuprofen 800 MG tablet Commonly known as: ADVIL Take 1 tablet (800 mg total) by mouth every 8 (eight) hours as needed.   losartan-hydrochlorothiazide 50-12.5 MG tablet Commonly known as: HYZAAR Take 1 tablet by mouth every morning.   lovastatin 40 MG tablet Commonly known as: MEVACOR Take 40 mg by mouth 2 (two) times daily.   magic mouthwash (nystatin, hydrocortisone, diphenhydrAMINE) suspension Swish and spit 5 mLs 3 (three) times daily as needed for mouth pain.   traZODone 100 MG tablet Commonly known as: DESYREL TAKE 1 TABLET BY MOUTH EVERY NIGHT AT BEDTIME       Allergies: No Known Allergies  Family History: Family History  Problem Relation Age of Onset  . Dementia Mother   . Stroke Father   . Hypertension Father   . Cancer Neg Hx   . Diabetes Neg Hx     Social History:  reports that she has never smoked. She has never used smokeless tobacco. She reports that she does not drink alcohol and does not use drugs.   Physical Exam: BP (!) 142/90   Pulse 71   Wt 135 lb (61.2 kg)   BMI 26.37 kg/m   Constitutional:  Alert and oriented, No acute distress.  Accompanied by daughter today. HEENT: [place] AT, moist mucus membranes.  Trachea midline, no masses. Skin: No rashes, bruises or suspicious lesions. Neurologic: Grossly intact, no focal deficits, moving all 4 extremities. Psychiatric: Normal mood and affect.  Urinalysis Results for orders placed or performed in visit on 10/08/20  CULTURE, URINE  COMPREHENSIVE   Specimen: Urine   UR  Result Value Ref Range   Urine Culture, Comprehensive Preliminary report (A)    Organism ID, Bacteria Gram negative rods (A)   Microscopic Examination   Urine  Result Value Ref Range   WBC, UA >30 (A) 0 - 5 /hpf   RBC 11-30 (A) 0 - 2 /hpf   Epithelial Cells (non renal) 0-10 0 - 10 /hpf   Renal Epithel, UA 0-10 (A) None seen /hpf   Bacteria, UA Many (A) None seen/Few  Urinalysis, Complete  Result Value Ref Range   Specific Gravity, UA 1.015 1.005 - 1.030   pH, UA 7.0 5.0 - 7.5   Color, UA Yellow Yellow   Appearance Ur Cloudy (A) Clear   Leukocytes,UA 3+ (A) Negative   Protein,UA 1+ (A) Negative/Trace   Glucose, UA Negative Negative   Ketones, UA Negative Negative   RBC, UA 2+ (A) Negative   Bilirubin, UA Negative Negative  Urobilinogen, Ur 0.2 0.2 - 1.0 mg/dL   Nitrite, UA Negative Negative   Microscopic Examination See below:      Pertinent Imaging:  Abdomen 1 view (KUB)  Narrative CLINICAL DATA:  Bilateral staghorn calculi  EXAM: ABDOMEN - 1 VIEW  COMPARISON:  CT 05/02/2020  FINDINGS: Large staghorn calculus noted in the right kidney. Numerous stones noted on the left, the largest 2.7 cm in the midpole region. Overall, the staghorn calculus on the left is decreased since prior CT. Left ureteral stent is in place. The proximal aspect of the ureteral stent is below the renal pelvis. Nonobstructive bowel gas pattern.  IMPRESSION: Bilateral nephrolithiasis with large staghorn calculus on the right and multiple calcifications on the left, the largest 2.7 cm.  Proximal aspect of left ureteral stent in the proximal left ureter.   Electronically Signed By: Rolm Baptise M.D. On: 10/08/2020 08:26  KUB personally reviewed today.  Residual stone burden on the study within an upper pole calyx with stenotic infundibulum.  Clinically, this was fragmented intraoperatively.  Suspect this is a large volume debris which is unable to  pass through the stenotic infundibulum.  Assessment & Plan:    1. Left nephrolithiasis Residual fairly significant left upper pole stone burden, suspect this is a large amount of debris/dust within an essentially obstructed calyx with an extremely stenotic infundibulum  I recommended returning to the operating room to open up this infundibulum and get the residual stone burden.  We will likely be able to accomplish this on 1 last treatment.  We discussed ureteroscopy again at length including risk of bleeding, infection, damage to any structures and infectious complications primarily.  She is chronically colonized with bacteria thus will likely pretreat her as on previous occasions to reduce her bacterial load.   2. Staghorn calculus Plan for right PCNL in the future, likely with multiple access in order to optimize stone clearance - Urinalysis, Complete - CULTURE, URINE COMPREHENSIVE  3. Thrush Continue to use Magic mouthwash as needed, improving  She asked if we could try to avoid Bactrim perioperatively in the future, thinks this particular antibiotic predisposes her to thrush more so than other antibiotics   Hollice Espy, MD  [facility] [address], [place], [place] [address] [phone]

## 2020-10-09 LAB — URINALYSIS, COMPLETE
Bilirubin, UA: NEGATIVE
Glucose, UA: NEGATIVE
Ketones, UA: NEGATIVE
Nitrite, UA: NEGATIVE
Specific Gravity, UA: 1.015 (ref 1.005–1.030)
Urobilinogen, Ur: 0.2 mg/dL (ref 0.2–1.0)
pH, UA: 7 (ref 5.0–7.5)

## 2020-10-09 LAB — MICROSCOPIC EXAMINATION: WBC, UA: >30 /hpf — AB (ref 0–5)

## 2020-10-11 LAB — CULTURE, URINE COMPREHENSIVE

## 2020-10-14 ENCOUNTER — Encounter: Payer: Self-pay | Admitting: Urology

## 2020-10-14 ENCOUNTER — Other Ambulatory Visit: Payer: Self-pay

## 2020-10-14 ENCOUNTER — Encounter
Admission: RE | Admit: 2020-10-14 | Discharge: 2020-10-14 | Disposition: A | Discharge status: Home / Self Care | Payer: PPO | Source: Ambulatory Visit | Attending: Urology | Admitting: Urology

## 2020-10-14 ENCOUNTER — Other Ambulatory Visit
Admission: RE | Admit: 2020-10-14 | Discharge: 2020-10-14 | Disposition: A | Discharge status: Home / Self Care | Payer: PPO | Source: Ambulatory Visit | Attending: Urology | Admitting: Urology

## 2020-10-14 ENCOUNTER — Telehealth: Payer: Self-pay

## 2020-10-14 DIAGNOSIS — Z20822 Contact with and (suspected) exposure to covid-19: Secondary | ICD-10-CM | POA: Insufficient documentation

## 2020-10-14 DIAGNOSIS — Z01812 Encounter for preprocedural laboratory examination: Secondary | ICD-10-CM | POA: Diagnosis not present

## 2020-10-14 MED ORDER — CEPHALEXIN 500 MG PO CAPS: 500.0000 mg | ORAL_CAPSULE | Freq: Four times a day (QID) | ORAL | 0 refills | Status: AC

## 2020-10-14 NOTE — Patient Instructions (Signed)
Your procedure is scheduled on: 10/16/20 Report to [facility]. To find out your arrival time please call [phone] between 1PM - 3PM on 10/15/20 .  Remember: Instructions that are not followed completely may result in serious medical risk, up to and including death, or upon the discretion of your surgeon and anesthesiologist your surgery may need to be rescheduled.     _X__ 1. Do not eat food or drink any liquids after midnight the night before your procedure.                 No gum chewing or hard candies.   __X__2.  On the morning of surgery brush your teeth with toothpaste and water, you                 may rinse your mouth with mouthwash if you wish.  Do not swallow any              toothpaste of mouthwash.     _X__ 3.  No Alcohol for 24 hours before or after surgery.   _X__ 4.  Do Not Smoke or use e-cigarettes For 24 Hours Prior to Your Surgery.                 Do not use any chewable tobacco products for at least 6 hours prior to                 surgery.  ____  5.  Bring all medications with you on the day of surgery if instructed.   __X__  6.  Notify your doctor if there is any change in your medical condition      (cold, fever, infections).     Do not wear jewelry, make-up, hairpins, clips or nail polish. Do not wear lotions, powders, or perfumes.  Do not shave 48 hours prior to surgery. Men may shave face and neck. Do not bring valuables to the hospital.    [facility] is not responsible for any belongings or valuables.  Contacts, dentures/partials or body piercings may not be worn into surgery. Bring a case for your contacts, glasses or hearing aids, a denture cup will be supplied. Leave your suitcase in the car. After surgery it may be brought to your room. For patients admitted to the hospital, discharge time is determined by your treatment team.   Patients discharged the day of surgery will not be allowed to drive  home.   Please read over the following fact sheets that you were given:     __X__ Take these medicines the morning of surgery with A SIP OF WATER:    1. famotidine (PEPCID) 20 MG tablet  2.   3.   4.  5.  6.  ____ Fleet Enema (as directed)   __X__ Use CHG Soap/SAGE wipes as directed  ____ Use inhalers on the day of surgery  ____ Stop metformin/Janumet/Farxiga 2 days prior to surgery    ____ Take 1/2 of usual insulin dose the night before surgery. No insulin the morning          of surgery.   ____ Stop Blood Thinners Coumadin/Plavix/Xarelto/Pleta/Pradaxa/Eliquis/Effient/Aspirin  on   Or contact your Surgeon, Cardiologist or Medical Doctor regarding  ability to stop your blood thinners  __X__ Stop Anti-inflammatories 7 days before surgery such as Advil, Ibuprofen, Motrin,  BC or Goodies Powder, Naprosyn, Naproxen, Aleve, Aspirin    __X__ Stop all herbal supplements, fish  oil or vitamin E until after surgery.    ____ Bring C-Pap to the hospital.

## 2020-10-14 NOTE — Telephone Encounter (Signed)
-----   Message from Hollice Espy, MD sent at 10/13/2020  5:46 PM EST ----- Please treat her with Keflex 500 mg 4 times daily starting tomorrow for total of 5 days.  Hollice Espy, MD

## 2020-10-14 NOTE — Telephone Encounter (Signed)
sw pt daughter. Sent medications to pharmacy. Will start today. Daughter states mother has had a fever no higher than 101 last night. As per Dr. Erlene Quan if fever does not continue ok to proceed with surgery. If fever continues please call clinic for labs or go to ER.

## 2020-10-15 LAB — SARS CORONAVIRUS 2 (TAT 6-24 HRS): SARS Coronavirus 2: NEGATIVE

## 2020-10-16 ENCOUNTER — Ambulatory Visit: Payer: PPO | Admitting: Anesthesiology

## 2020-10-16 ENCOUNTER — Encounter
Admission: RE | Disposition: A | Discharge status: Home / Self Care | Payer: Self-pay | Source: Home / Self Care | Attending: Urology

## 2020-10-16 ENCOUNTER — Ambulatory Visit: Payer: PPO

## 2020-10-16 ENCOUNTER — Ambulatory Visit
Admission: RE | Admit: 2020-10-16 | Discharge: 2020-10-16 | Disposition: A | Discharge status: Home / Self Care | Payer: PPO | Attending: Urology | Admitting: Urology

## 2020-10-16 ENCOUNTER — Other Ambulatory Visit: Payer: Self-pay

## 2020-10-16 ENCOUNTER — Encounter: Payer: Self-pay | Admitting: Urology

## 2020-10-16 DIAGNOSIS — Z8616 Personal history of COVID-19: Secondary | ICD-10-CM | POA: Insufficient documentation

## 2020-10-16 DIAGNOSIS — Z7983 Long term (current) use of bisphosphonates: Secondary | ICD-10-CM | POA: Diagnosis not present

## 2020-10-16 DIAGNOSIS — N201 Calculus of ureter: Secondary | ICD-10-CM | POA: Diagnosis not present

## 2020-10-16 DIAGNOSIS — N202 Calculus of kidney with calculus of ureter: Secondary | ICD-10-CM

## 2020-10-16 DIAGNOSIS — Z79899 Other long term (current) drug therapy: Secondary | ICD-10-CM | POA: Insufficient documentation

## 2020-10-16 DIAGNOSIS — B379 Candidiasis, unspecified: Secondary | ICD-10-CM | POA: Diagnosis not present

## 2020-10-16 DIAGNOSIS — Z466 Encounter for fitting and adjustment of urinary device: Secondary | ICD-10-CM | POA: Diagnosis not present

## 2020-10-16 DIAGNOSIS — N132 Hydronephrosis with renal and ureteral calculous obstruction: Secondary | ICD-10-CM | POA: Diagnosis not present

## 2020-10-16 DIAGNOSIS — N200 Calculus of kidney: Secondary | ICD-10-CM | POA: Diagnosis present

## 2020-10-16 HISTORY — PX: CYSTOSCOPY/URETEROSCOPY/HOLMIUM LASER/STENT PLACEMENT: SHX6546

## 2020-10-16 SURGERY — CYSTOSCOPY/URETEROSCOPY/HOLMIUM LASER/STENT PLACEMENT
Anesthesia: General | Laterality: Left

## 2020-10-16 MED ORDER — FENTANYL CITRATE (PF) 100 MCG/2ML IJ SOLN: 25.0000 ug | INTRAMUSCULAR | Status: DC | PRN

## 2020-10-16 MED ORDER — IOHEXOL 180 MG/ML  SOLN
INTRAMUSCULAR | Status: DC | PRN
  Administered 2020-10-16: 20 mL

## 2020-10-16 MED ORDER — EPHEDRINE SULFATE 50 MG/ML IJ SOLN
INTRAMUSCULAR | Status: DC | PRN
  Administered 2020-10-16: 10 mg via INTRAVENOUS

## 2020-10-16 MED ORDER — LACTATED RINGERS IV SOLN: INTRAVENOUS | Status: DC | PRN

## 2020-10-16 MED ORDER — PHENYLEPHRINE HCL (PRESSORS) 10 MG/ML IV SOLN
INTRAVENOUS | Status: DC | PRN
  Administered 2020-10-16: 200 ug via INTRAVENOUS
  Administered 2020-10-16 (×3): 100 ug via INTRAVENOUS

## 2020-10-16 MED ORDER — LACTATED RINGERS IV SOLN: INTRAVENOUS | Status: DC

## 2020-10-16 MED ORDER — SUGAMMADEX SODIUM 200 MG/2ML IV SOLN
INTRAVENOUS | Status: DC | PRN
  Administered 2020-10-16: 200 mg via INTRAVENOUS

## 2020-10-16 MED ORDER — KETOROLAC TROMETHAMINE 30 MG/ML IJ SOLN
INTRAMUSCULAR | Status: DC | PRN
  Administered 2020-10-16: 30 mg via INTRAVENOUS

## 2020-10-16 MED ORDER — PHENYLEPHRINE HCL-NACL 10-0.9 MG/250ML-% IV SOLN
INTRAVENOUS | Status: DC | PRN
  Administered 2020-10-16: 20 ug/min via INTRAVENOUS

## 2020-10-16 MED ORDER — CEFAZOLIN SODIUM-DEXTROSE 2-4 GM/100ML-% IV SOLN
2.0000 g | INTRAVENOUS | Status: AC
  Administered 2020-10-16: 2 g via INTRAVENOUS

## 2020-10-16 MED ORDER — FENTANYL CITRATE (PF) 100 MCG/2ML IJ SOLN
INTRAMUSCULAR | Status: DC | PRN
  Administered 2020-10-16 (×2): 50 ug via INTRAVENOUS
  Administered 2020-10-16: 25 ug via INTRAVENOUS

## 2020-10-16 MED ORDER — MIDAZOLAM HCL 2 MG/2ML IJ SOLN
INTRAMUSCULAR | Status: DC | PRN
  Administered 2020-10-16: 1 mg via INTRAVENOUS

## 2020-10-16 MED ORDER — ROCURONIUM BROMIDE 100 MG/10ML IV SOLN
INTRAVENOUS | Status: DC | PRN
  Administered 2020-10-16: 20 mg via INTRAVENOUS
  Administered 2020-10-16: 10 mg via INTRAVENOUS
  Administered 2020-10-16: 40 mg via INTRAVENOUS
  Administered 2020-10-16: 20 mg via INTRAVENOUS

## 2020-10-16 MED ORDER — LIDOCAINE HCL (CARDIAC) PF 100 MG/5ML IV SOSY
PREFILLED_SYRINGE | INTRAVENOUS | Status: DC | PRN
  Administered 2020-10-16: 80 mg via INTRAVENOUS

## 2020-10-16 MED ORDER — ONDANSETRON HCL 4 MG/2ML IJ SOLN: 4.0000 mg | Freq: Once | INTRAMUSCULAR | Status: DC | PRN

## 2020-10-16 MED ORDER — CEFAZOLIN SODIUM-DEXTROSE 2-4 GM/100ML-% IV SOLN
INTRAVENOUS | Status: AC
  Filled 2020-10-16: qty 100

## 2020-10-16 MED ORDER — CHLORHEXIDINE GLUCONATE 0.12 % MT SOLN
OROMUCOSAL | Status: AC
  Administered 2020-10-16: 15 mL via OROMUCOSAL
  Filled 2020-10-16: qty 15

## 2020-10-16 MED ORDER — CHLORHEXIDINE GLUCONATE 0.12 % MT SOLN: 15.0000 mL | Freq: Once | OROMUCOSAL | Status: AC

## 2020-10-16 MED ORDER — ACETAMINOPHEN 10 MG/ML IV SOLN
INTRAVENOUS | Status: AC
  Filled 2020-10-16: qty 100

## 2020-10-16 MED ORDER — ACETAMINOPHEN 10 MG/ML IV SOLN
INTRAVENOUS | Status: DC | PRN
  Administered 2020-10-16: 1000 mg via INTRAVENOUS

## 2020-10-16 MED ORDER — ONDANSETRON HCL 4 MG PO TABS: 4.0000 mg | ORAL_TABLET | Freq: Three times a day (TID) | ORAL | 0 refills | Status: DC | PRN

## 2020-10-16 MED ORDER — PROPOFOL 10 MG/ML IV BOLUS
INTRAVENOUS | Status: AC
  Filled 2020-10-16: qty 20

## 2020-10-16 MED ORDER — FENTANYL CITRATE (PF) 100 MCG/2ML IJ SOLN
INTRAMUSCULAR | Status: AC
  Filled 2020-10-16: qty 2

## 2020-10-16 MED ORDER — ORAL CARE MOUTH RINSE: 15.0000 mL | Freq: Once | OROMUCOSAL | Status: AC

## 2020-10-16 MED ORDER — MIDAZOLAM HCL 2 MG/2ML IJ SOLN
INTRAMUSCULAR | Status: AC
  Filled 2020-10-16: qty 2

## 2020-10-16 MED ORDER — PROPOFOL 10 MG/ML IV BOLUS
INTRAVENOUS | Status: DC | PRN
  Administered 2020-10-16: 110 mg via INTRAVENOUS

## 2020-10-16 SURGICAL SUPPLY — 25 items
BAG DRAIN CYSTO-URO LG1000N (MISCELLANEOUS) ×2 IMPLANT
BASKET ZERO TIP 1.9FR (BASKET) ×4 IMPLANT
BRUSH SCRUB EZ 1% IODOPHOR (MISCELLANEOUS) ×2 IMPLANT
CATH URET FLEX-TIP 2 LUMEN 10F (CATHETERS) ×2 IMPLANT
CATH URETL 5X70 OPEN END (CATHETERS) ×2 IMPLANT
CNTNR SPEC 2.5X3XGRAD LEK (MISCELLANEOUS)
CONT SPEC 4OZ STER OR WHT (MISCELLANEOUS)
CONTAINER SPEC 2.5X3XGRAD LEK (MISCELLANEOUS) IMPLANT
DRAPE UTILITY 15X26 TOWEL STRL (DRAPES) ×2 IMPLANT
GLOVE SURG ENC MOIS LTX SZ6.5 (GLOVE) ×2 IMPLANT
GOWN STRL REUS W/ TWL LRG LVL3 (GOWN DISPOSABLE) ×2 IMPLANT
GOWN STRL REUS W/TWL LRG LVL3 (GOWN DISPOSABLE) ×2
GUIDEWIRE GREEN .038 145CM (MISCELLANEOUS) ×2 IMPLANT
GUIDEWIRE STR DUAL SENSOR (WIRE) ×2 IMPLANT
INFUSOR MANOMETER BAG 3000ML (MISCELLANEOUS) ×2 IMPLANT
KIT TURNOVER CYSTO (KITS) ×2 IMPLANT
PACK CYSTO AR (MISCELLANEOUS) ×2 IMPLANT
SET CYSTO W/LG BORE CLAMP LF (SET/KITS/TRAYS/PACK) ×2 IMPLANT
SHEATH URETERAL 12FRX35CM (MISCELLANEOUS) ×2 IMPLANT
SOL .9 NS 3000ML IRR  AL (IV SOLUTION) ×1
SOL .9 NS 3000ML IRR UROMATIC (IV SOLUTION) ×1 IMPLANT
STENT URET 6FRX22 CONTOUR (STENTS) ×2 IMPLANT
SURGILUBE 2OZ TUBE FLIPTOP (MISCELLANEOUS) ×2 IMPLANT
TRACTIP FLEXIVA PULSE ID 200 (Laser) ×2 IMPLANT
WATER STERILE IRR 1000ML POUR (IV SOLUTION) ×2 IMPLANT

## 2020-10-16 NOTE — Anesthesia Procedure Notes (Signed)
Procedure Name: Intubation Date/Time: 10/16/2020 1:01 PM Performed by: Allean Found, CRNA Pre-anesthesia Checklist: Patient identified, Patient being monitored, Timeout performed, Emergency Drugs available and Suction available Patient Re-evaluated:Patient Re-evaluated prior to induction Oxygen Delivery Method: Circle system utilized Preoxygenation: Pre-oxygenation with 100% oxygen Induction Type: IV induction Ventilation: Mask ventilation without difficulty Laryngoscope Size: 3 and McGraph Grade View: Grade I Tube type: Oral Tube size: 6.5 mm Number of attempts: 1 Airway Equipment and Method: Stylet Placement Confirmation: ETT inserted through vocal cords under direct vision,  positive ETCO2 and breath sounds checked- equal and bilateral Secured at: 21 cm Tube secured with: Tape Dental Injury: Teeth and Oropharynx as per pre-operative assessment  Comments: Patient complained of sore throat with previous surgery and intubation.  Today we used 6.5 ETT and 2% Lidocaine jelly for lubricant. Easy intubation.

## 2020-10-16 NOTE — Op Note (Signed)
Date of procedure: 10/16/20  Preoperative diagnosis:  1. Left kidney stones  Postoperative diagnosis:  1. Same as above 2. Left Steinstrasse/ureteral stones  Procedure: 1. Left ureteroscopy 2. Left retrograde pyelogram 3. Left ureteral stent exchange 4. Basket extraction of stone fragment 5. interpretation of fluoroscopy less than 30 minutes  Surgeon: Hollice Espy, MD  Anesthesia: General  Complications: None  Intraoperative findings: Extensive stone debris throughout the entirety of ureter consistent with Steinstrasse along with stent.  2 calyces in the midpole kidney with copious stone debris, primarily small fragments but a few larger fragments.  Ultimately radiographically cleared of all stone burden.  EBL: Minimal  Specimens: None  Drains: 6 x 22 French double-J ureteral stent on left  Indication: Gabrielle Barnes is a 70 y.o. patient with bilateral staghorn calculi who is undergone multiple staged procedures to clear left-sided stone burden for which she has a small amount of residual stone.  After reviewing the management options for treatment, she elected to proceed with the above surgical procedure(s). We have discussed the potential benefits and risks of the procedure, side effects of the proposed treatment, the likelihood of the patient achieving the goals of the procedure, and any potential problems that might occur during the procedure or recuperation. Informed consent has been obtained.  Description of procedure:  The patient was taken to the operating room and general anesthesia was induced.  The patient was placed in the dorsal lithotomy position, prepped and draped in the usual sterile fashion, and preoperative antibiotics were administered. A preoperative time-out was performed.   A 21 French scope was advanced per urethra into the bladder.  Attention was turned to the left ureter orifice from which a ureteral stent was seen emanating.  Stent graspers were used  to grasp the distal coil of the stent and bring it to the level of the urethral meatus.  Again, the stent was calcified and they used scissors to amputate the calcified portion ultimately was able to cannulate more proximal aspect of the stent with a sensor wire up to the level of the kidney.  There was an amorphous collection of stone, at least 1 cm in upper tract calyx as well as some lower pole calcifications.  Please see scout imaging saved in PACS.  The wire was able to be advanced up to the level of the kidney without difficulty and the stent was removed.  I then attempted to use a dual-lumen ureteral access sheath just within the distal ureter but met some resistance.  I injected Conray through the second port for retrograde pyelogram which showed some mild hydronephrosis as well as filling defects along the entire length of the ureter consistent with probable stone debris.  I was unable to get a Super Stiff wire and the second port and thus the decision was made to perform semirigid ureteroscopy.  A semirigid ureteroscope was then brought in and advanced into the distal ureter were immediately stone debris was encountered.  This appeared relatively small.  I used a 1.9 tipless nitinol basket to start extracting some of the distal ureteral stones.  I was then ultimately able to advance the scope to the level of the iliacs where the stone was more profound and almost impacted.  At this point I brought in a 242 m laser fiber and using dusting settings of 0.2 J and 40 Hz, I fragmented this smaller stone and ultimately was able to advance the scope up into the proximal ureter.  The entirety of the  ureter was almost entirely full of stone debris.  I used combination of the laser and the basket ultimately was able to clear the entire ureter of stone debris which was somewhat time-consuming.  Next, is able to advance a access sheath all the way up to the level of the proximal ureter Gabrielle Barnes 12/14 and at this point  in time, exchanged the semirigid scope for a dual-lumen flexible ureteroscope.  Upon entry into the renal pelvis, few small stones and stone debris were encountered which were cleared using commendation of laser and basket as previously described.  There is a more significant stone burden in mid lower pole calyx which is fragmented and cleared.  Ultimately, I was able to both visually clear the entirety of the left kidney as well as radiographically clear, please see epic for details.  One last final retrograde pyelogram was performed which showed no contrast extravasation.  This also created roadmap to ensure that each naviculars was directly visualized.  The scope was then backed down the length of the ureter inspecting along the way.  A few small residual stone fragments were identified and cleared.  There were no ureteral injuries appreciated.  A 6 x 22 French double-J ureteral stent was placed over the sensor wire up to the level of the kidney.  The wire was withdrawn until full coils noted both within the renal pelvis as well as within the bladder.  The bladder was drained.  The stent string was left attached to the distal coil of the stent which was affixed to the patient's left inner thigh using Mastisol and Tegaderm.  She was then cleaned and dried, repositioned in supine position, reversed from anesthesia and taken to the PACU in stable condition.  Plan: She may remove her own stent next week.  We will have her follow-up in 1 month with renal ultrasound prior.  At that point, we will discuss the timing of treatment of her right staghorn calculus.   Hollice Espy, M.D.

## 2020-10-16 NOTE — Discharge Instructions (Signed)
You have a ureteral stent in place.  This is a tube that extends from your kidney to your bladder.  This may cause urinary bleeding, burning with urination, and urinary frequency.  Please call our office or present to the ED if you develop fevers >101 or pain which is not able to be controlled with oral pain medications.  You may be given either Flomax and/ or ditropan to help with bladder spasms and stent pain in addition to pain medications.    [facility] [address], [place], [place] [address] [phone]   AMBULATORY SURGERY  DISCHARGE INSTRUCTIONS   1) The drugs that you were given will stay in your system until tomorrow so for the next 24 hours you should not:  A) Drive an automobile B) Make any legal decisions C) Drink any alcoholic beverage   2) You may resume regular meals tomorrow.  Today it is better to start with liquids and gradually work up to solid foods.  You may eat anything you prefer, but it is better to start with liquids, then soup and crackers, and gradually work up to solid foods.   3) Please notify your doctor immediately if you have any unusual bleeding, trouble breathing, redness and pain at the surgery site, drainage, fever, or pain not relieved by medication.  4) Additional Instructions :Force fluids Avoid caffeine and cranberry juice

## 2020-10-16 NOTE — Transfer of Care (Signed)
Immediate Anesthesia Transfer of Care Note  Patient: Gabrielle Barnes  Procedure(s) Performed: CYSTOSCOPY/URETEROSCOPY/HOLMIUM LASER/STENT Exchange (Left )  Patient Location: PACU  Anesthesia Type:General  Level of Consciousness: awake and alert   Airway & Oxygen Therapy: Patient Spontanous Breathing and Patient connected to face mask oxygen  Post-op Assessment: Report given to RN and Post -op Vital signs reviewed and stable  Post vital signs: Reviewed and stable  Last Vitals:  Vitals Value Taken Time  BP 125/70 10/16/20 1530  Temp 36.2 C 10/16/20 1528  Pulse 65 10/16/20 1532  Resp 17 10/16/20 1532  SpO2 98 % 10/16/20 1532  Vitals shown include unvalidated device data.  Last Pain:  Vitals:   10/16/20 1528  TempSrc:   PainSc: Asleep         Complications: No complications documented.

## 2020-10-16 NOTE — Interval H&P Note (Signed)
History and Physical Interval Note:  10/16/2020 12:27 PM  Gabrielle Barnes  has presented today for surgery, with the diagnosis of left ureteral stone.  The various methods of treatment have been discussed with the patient and family. After consideration of risks, benefits and other options for treatment, the patient has consented to  Procedure(s): CYSTOSCOPY/URETEROSCOPY/HOLMIUM LASER/STENT Exchange (Left) as a surgical intervention.  The patient's history has been reviewed, patient examined, no change in status, stable for surgery.  I have reviewed the patient's chart and labs.  Questions were answered to the patient's satisfaction.    RRR CTAB  UCx+, treated with correct culture specific abx   Hollice Espy

## 2020-10-16 NOTE — Anesthesia Preprocedure Evaluation (Signed)
Anesthesia Evaluation  Patient identified by MRN, date of birth, ID band Patient awake    Reviewed: Allergy & Precautions, NPO status , Patient's Chart, lab work & pertinent test results  History of Anesthesia Complications Negative for: history of anesthetic complications  Airway Mallampati: II       Dental   Pulmonary neg sleep apnea, neg COPD, Not current smoker,           Cardiovascular hypertension, Pt. on medications (-) Past MI and (-) CHF (-) dysrhythmias (-) Valvular Problems/Murmurs     Neuro/Psych neg Seizures Depression    GI/Hepatic Neg liver ROS, GERD  Medicated,  Endo/Other  neg diabetes  Renal/GU Renal disease (stones)     Musculoskeletal   Abdominal   Peds  Hematology   Anesthesia Other Findings   Reproductive/Obstetrics                             Anesthesia Physical  Anesthesia Plan  ASA: II  Anesthesia Plan: General   Post-op Pain Management:    Induction: Intravenous  PONV Risk Score and Plan: 3 and Ondansetron and Dexamethasone  Airway Management Planned: Oral ETT  Additional Equipment:   Intra-op Plan:   Post-operative Plan: Extubation in OR  Informed Consent: I have reviewed the patients History and Physical, chart, labs and discussed the procedure including the risks, benefits and alternatives for the proposed anesthesia with the patient or authorized representative who has indicated his/her understanding and acceptance.     Dental advisory given  Plan Discussed with: CRNA and Surgeon  Anesthesia Plan Comments:         Anesthesia Quick Evaluation

## 2020-10-17 ENCOUNTER — Encounter: Payer: Self-pay | Admitting: Urology

## 2020-10-21 ENCOUNTER — Ambulatory Visit: Payer: PPO | Admitting: Surgery

## 2020-10-21 NOTE — Anesthesia Postprocedure Evaluation (Signed)
Anesthesia Post Note  Patient: Gabrielle Barnes  Procedure(s) Performed: CYSTOSCOPY/URETEROSCOPY/HOLMIUM LASER/STENT Exchange (Left )  Patient location during evaluation: PACU Anesthesia Type: General Level of consciousness: awake and alert and oriented Pain management: pain level controlled Vital Signs Assessment: post-procedure vital signs reviewed and stable Respiratory status: spontaneous breathing Cardiovascular status: blood pressure returned to baseline Anesthetic complications: no   No complications documented.   Last Vitals:  Vitals:   10/16/20 1600 10/16/20 1606  BP: 127/62 (!) 137/56  Pulse: 63 66  Resp: 14 16  Temp: 36.8 C (!) 36.1 C  SpO2: 98% 100%    Last Pain:  Vitals:   10/16/20 1606  TempSrc: Temporal  PainSc: 0-No pain                 Bobbi Yount

## 2020-11-17 ENCOUNTER — Telehealth: Payer: Self-pay

## 2020-11-17 DIAGNOSIS — N201 Calculus of ureter: Secondary | ICD-10-CM

## 2020-11-17 NOTE — Telephone Encounter (Signed)
Incoming call from pt who states she needs to know when her stent is going to be removed as it is starting to cause her discomfort. Per Op note from 10/16/20 pt was to remove her own stent, a week from procedure however she states that there was no string tethered for her to remove, nor was she aware she was supposed to removed. Pt verbalizes confusion in regards to what her next step should be and if she is going to require another stent going forward. Please advise.

## 2020-11-17 NOTE — Telephone Encounter (Signed)
I do recall leaving it on a string as outlined by op note, perhaps the string came loose or perhaps even she dislodged the stent accidentally?  Either way, if the stent is still there it needs to come out ASAP.  It also doesn't look like her f/u was made so I'm glad she called, please thank her for contacting us!  Have her go get a KUB and schedule her for cystoscopy, stent removal as early as this afternoon or later this week.  Hollice Espy, MD

## 2020-11-17 NOTE — Telephone Encounter (Signed)
Called patient-denies pain-discomfort when sitting. Denies fever, body aches, chills. Unable to come to same day appointment due to transportation. Scheduled follow up tomorrow-aware to get KUB done prior. Voiced understanding.

## 2020-11-18 ENCOUNTER — Ambulatory Visit
Admission: RE | Admit: 2020-11-18 | Discharge: 2020-11-18 | Disposition: A | Discharge status: Home / Self Care | Payer: PPO | Attending: Urology | Admitting: Urology

## 2020-11-18 ENCOUNTER — Ambulatory Visit: Payer: PPO | Admitting: Urology

## 2020-11-18 ENCOUNTER — Encounter: Payer: Self-pay | Admitting: Urology

## 2020-11-18 ENCOUNTER — Other Ambulatory Visit: Payer: Self-pay

## 2020-11-18 ENCOUNTER — Ambulatory Visit
Admission: RE | Admit: 2020-11-18 | Discharge: 2020-11-18 | Disposition: A | Discharge status: Home / Self Care | Payer: PPO | Source: Ambulatory Visit | Attending: Urology | Admitting: Urology

## 2020-11-18 VITALS — BP 145/87 | HR 75 | Ht 60.0 in | Wt 136.0 lb

## 2020-11-18 DIAGNOSIS — N201 Calculus of ureter: Secondary | ICD-10-CM

## 2020-11-18 DIAGNOSIS — R109 Unspecified abdominal pain: Secondary | ICD-10-CM | POA: Diagnosis not present

## 2020-11-18 DIAGNOSIS — N200 Calculus of kidney: Secondary | ICD-10-CM

## 2020-11-18 DIAGNOSIS — R8271 Bacteriuria: Secondary | ICD-10-CM

## 2020-11-18 LAB — URINALYSIS, COMPLETE
Bilirubin, UA: NEGATIVE
Glucose, UA: NEGATIVE
Ketones, UA: NEGATIVE
Nitrite, UA: POSITIVE — AB
Specific Gravity, UA: 1.01 (ref 1.005–1.030)
Urobilinogen, Ur: 0.2 mg/dL (ref 0.2–1.0)
pH, UA: 7 (ref 5.0–7.5)

## 2020-11-18 LAB — MICROSCOPIC EXAMINATION: WBC, UA: >30 /hpf — AB (ref 0–5)

## 2020-11-18 MED ORDER — CEPHALEXIN 500 MG PO CAPS: 500.0000 mg | ORAL_CAPSULE | Freq: Four times a day (QID) | ORAL | 0 refills | Status: DC

## 2020-11-18 MED ORDER — CEFTRIAXONE SODIUM 1 G IJ SOLR
1.0000 g | Freq: Once | INTRAMUSCULAR | Status: AC
  Administered 2020-11-18: 1 g via INTRAMUSCULAR

## 2020-11-18 NOTE — Progress Notes (Signed)
IM Injection  Patient is present today for an IM Injection Drug: Rocephin Dose:1GM Location:Left upper outer buttocks Lot: [id number] Exp:11/2022 Patient tolerated well, no complications were noted  Preformed by: Fonnie Jarvis, CMA

## 2020-11-18 NOTE — Progress Notes (Signed)
   11/18/20  CC:  Chief Complaint  Patient presents with  . Cysto Stent Removal    HPI: 70 year old female who presents today for cystoscopy, stone removal.  She has a personal history of bilateral staghorn calculi status post left PCNL followed by staged ureteroscopy.  There are some confusion about her left ureteral stent.  It was left on a tether however she never could find the tether and did not know when to take out the stent.  She called [place] a month later reporting that her stent had not been removed.  KUB today does show in fact retained stent with minimal significant residual stone burden on the left.  No significance ureteral stent calcification.  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  She does report that she has been feeling a little bit fatigued but no specific urinary symptoms or fevers.  NED. A&Ox3.   No respiratory distress   Abd soft, NT, ND Normal external genitalia with patent urethral meatus  Cystoscopy/ Stent removal procedure  Patient identification was confirmed, informed consent was obtained, and patient was prepped using Betadine solution.  Lidocaine jelly was administered per urethral meatus.    Preoperative abx where received prior to procedure.    Procedure: - Flexible cystoscope introduced, without any difficulty.   - Thorough search of the bladder revealed:    normal urethral meatus  Stent seen emanating from left ureteral orifice, grasped with stent graspers, and removed in entirety.  No tether was identified.   Post-Procedure: - Patient tolerated the procedure well   Assessment/ Plan:  1. Left ureteral stone Status post staged left ureteroscopy  KUB today revealed fairly minimal residual stone burden, not encrusted stent which was removed today without difficulty  Warning symptoms reviewed, prophylactic  antibiotics given today as well as a short course of 5 days of Keflex due to the parents of her urine today and manipulation with stent removal today.  - Urinalysis, Complete - cefTRIAXone (ROCEPHIN) injection 1 g - CULTURE, URINE COMPREHENSIVE  2. Staghorn calculus Full nonobstructing right-sided staghorn  As previously discussed, will give her several month break and then pursue right PCNL to address the stone, follow-up in 3 months or sooner should become symptomatic, develops flank pain  3. Chronic bacteriuria As above     Return in about 3 months (around 02/17/2021) for 87mo follow up.  Hollice Espy, MD

## 2020-11-24 LAB — CULTURE, URINE COMPREHENSIVE

## 2020-11-30 ENCOUNTER — Other Ambulatory Visit: Payer: PPO | Admitting: Urology

## 2020-12-16 ENCOUNTER — Ambulatory Visit: Payer: PPO | Admitting: Urology

## 2020-12-24 DIAGNOSIS — H40003 Preglaucoma, unspecified, bilateral: Secondary | ICD-10-CM | POA: Diagnosis not present

## 2020-12-24 DIAGNOSIS — H43813 Vitreous degeneration, bilateral: Secondary | ICD-10-CM | POA: Diagnosis not present

## 2021-01-14 DIAGNOSIS — E538 Deficiency of other specified B group vitamins: Secondary | ICD-10-CM | POA: Diagnosis not present

## 2021-01-14 DIAGNOSIS — N3090 Cystitis, unspecified without hematuria: Secondary | ICD-10-CM | POA: Diagnosis not present

## 2021-01-14 DIAGNOSIS — E782 Mixed hyperlipidemia: Secondary | ICD-10-CM | POA: Diagnosis not present

## 2021-01-14 DIAGNOSIS — R739 Hyperglycemia, unspecified: Secondary | ICD-10-CM | POA: Diagnosis not present

## 2021-01-21 DIAGNOSIS — E538 Deficiency of other specified B group vitamins: Secondary | ICD-10-CM | POA: Diagnosis not present

## 2021-01-21 DIAGNOSIS — M818 Other osteoporosis without current pathological fracture: Secondary | ICD-10-CM | POA: Diagnosis not present

## 2021-01-21 DIAGNOSIS — E782 Mixed hyperlipidemia: Secondary | ICD-10-CM | POA: Diagnosis not present

## 2021-01-21 DIAGNOSIS — N179 Acute kidney failure, unspecified: Secondary | ICD-10-CM | POA: Diagnosis not present

## 2021-01-21 DIAGNOSIS — Z0000 Encounter for general adult medical examination without abnormal findings: Secondary | ICD-10-CM | POA: Diagnosis not present

## 2021-01-22 ENCOUNTER — Other Ambulatory Visit: Payer: Self-pay | Admitting: Internal Medicine

## 2021-01-22 DIAGNOSIS — N179 Acute kidney failure, unspecified: Secondary | ICD-10-CM

## 2021-01-27 ENCOUNTER — Encounter: Payer: Self-pay | Admitting: Urology

## 2021-02-16 DIAGNOSIS — M810 Age-related osteoporosis without current pathological fracture: Secondary | ICD-10-CM | POA: Diagnosis not present

## 2021-02-16 DIAGNOSIS — N179 Acute kidney failure, unspecified: Secondary | ICD-10-CM | POA: Diagnosis not present

## 2021-02-18 ENCOUNTER — Encounter: Payer: Self-pay | Admitting: Urology

## 2021-02-18 ENCOUNTER — Ambulatory Visit: Payer: PPO | Admitting: Urology

## 2021-02-18 ENCOUNTER — Other Ambulatory Visit: Payer: Self-pay

## 2021-02-18 VITALS — BP 160/75 | HR 68 | Ht 60.0 in | Wt 139.0 lb

## 2021-02-18 DIAGNOSIS — N200 Calculus of kidney: Secondary | ICD-10-CM | POA: Diagnosis not present

## 2021-02-18 DIAGNOSIS — N179 Acute kidney failure, unspecified: Secondary | ICD-10-CM | POA: Diagnosis not present

## 2021-02-18 LAB — URINALYSIS, COMPLETE
Bilirubin, UA: NEGATIVE
Glucose, UA: NEGATIVE
Ketones, UA: NEGATIVE
Nitrite, UA: NEGATIVE
Protein,UA: NEGATIVE
Specific Gravity, UA: 1.02 (ref 1.005–1.030)
Urobilinogen, Ur: 0.2 mg/dL (ref 0.2–1.0)
pH, UA: 5.5 (ref 5.0–7.5)

## 2021-02-18 LAB — MICROSCOPIC EXAMINATION: RBC, Urine: >30 /hpf — AB (ref 0–2)

## 2021-02-18 NOTE — Progress Notes (Signed)
02/18/2021 8:45 AM   Gabrielle Barnes 04/27/1951 [id number]  Referring provider: Rusty Aus, MD [facility] [place],  [place] [address]  Chief Complaint  Patient presents with   Nephrolithiasis    Discuss stone management     HPI: 70 year old female with bilateral staghorn's, chronic UTIs who is now status post treatment of her left-sided stone burden with PCNL and ureteroscopy x2.  She presents today for discussion of management of her right-sided full staghorn calculus.  She notes today that she is scheduled for renal ultrasound tomorrow which is her 70th birthday.  This is ordered by her primary care physician due to elevation in her creatinine to 1.6 from baseline of 1.  Since trended back to 1.2 which is reassuring.  She has no UTI symptoms today but waxes and wanes with the symptoms.  Urinalysis is chronically positive.  She does have intermittent right flank pain from her stone.  She also notes some low left-sided back pain overlying her sacrum exacerbated with movement.  She is anxious to move forward with the goal of becoming stone free.  She is accompanied today by her daughter.   PMH: Past Medical History:  Diagnosis Date   COVID-19 06/2020   Depression    after husbands death   GERD (gastroesophageal reflux disease)    History of chicken pox    History of kidney stones    History of measles    Hypertension    Migraine     Surgical History: Past Surgical History:  Procedure Laterality Date   ABDOMINAL HYSTERECTOMY     APPENDECTOMY     CESAREAN SECTION     colonoscopy     CYSTOSCOPY WITH HOLMIUM LASER LITHOTRIPSY  08/18/2020   Procedure: HOLMIUM LASER LITHOTRIPSY;  Surgeon: Hollice Espy, MD;  Location: [facility] OR[facility];  Service: Urology;;   CYSTOSCOPY/URETEROSCOPY/HOLMIUM LASER/STENT PLACEMENT Left 09/15/2020   Procedure: CYSTOSCOPY/URETEROSCOPY/HOLMIUM LASER/STENT Exchange;  Surgeon: Hollice Espy, MD;  Location:  [facility];  Service: Urology;  Laterality: Left;   CYSTOSCOPY/URETEROSCOPY/HOLMIUM LASER/STENT PLACEMENT Left 10/16/2020   Procedure: CYSTOSCOPY/URETEROSCOPY/HOLMIUM LASER/STENT Exchange;  Surgeon: Hollice Espy, MD;  Location: [facility];  Service: Urology;  Laterality: Left;   IR NEPHROSTOMY PLACEMENT LEFT  08/18/2020   NEPHROLITHOTOMY Left 08/18/2020   Procedure: NEPHROLITHOTOMY PERCUTANEOUS;  Surgeon: Hollice Espy, MD;  Location: [facility];  Service: Urology;  Laterality: Left;  possibly appropriate for [place] Right 06/13/2020   Procedure: XI ROBOTIC LAPAROSCOPIC ASSISTED APPENDECTOMY, RIGHT COLECTOMY;  Surgeon: Ronny Bacon, MD;  Location: [facility];  Service: General;  Laterality: Right;    Home Medications:  Allergies as of 02/18/2021       Reactions   Keflex [cephalexin] Nausea Only        Medication List        Accurate as of February 18, 2021 11:59 PM. If you have any questions, ask your nurse or doctor.          STOP taking these medications    cephALEXin 500 MG capsule Commonly known as: Keflex Stopped by: Hollice Espy, MD   losartan-hydrochlorothiazide 50-12.5 MG tablet Commonly known as: HYZAAR Stopped by: Hollice Espy, MD   ondansetron 4 MG tablet Commonly known as: Zofran Stopped by: Hollice Espy, MD       TAKE these medications    alendronate 70 MG tablet Commonly known as: FOSAMAX Take 1 tablet (70 mg total) by mouth once a week. Take with a  full glass of water on an empty stomach. What changed: when to take this   amLODipine 5 MG tablet Commonly known as: NORVASC Take 5 mg by mouth daily.   CALCIUM 600/VITAMIN D3 PO Take 1 tablet by mouth daily.   diphenoxylate-atropine 2.5-0.025 MG tablet Commonly known as: LOMOTIL Take 1 tablet by mouth 4 (four) times daily as needed for diarrhea or loose stools.   docusate sodium 100 MG capsule Commonly known as: COLACE Take 1 capsule (100 mg  total) by mouth 2 (two) times daily.   dorzolamide-timolol 22.3-6.8 MG/ML ophthalmic solution Commonly known as: COSOPT Place 1 drop into both eyes 2 (two) times daily.   famotidine 20 MG tablet Commonly known as: PEPCID TAKE 1 TABLET BY MOUTH TWICE A DAY What changed:  how much to take when to take this reasons to take this   ibuprofen 800 MG tablet Commonly known as: ADVIL Take 1 tablet (800 mg total) by mouth every 8 (eight) hours as needed.   lovastatin 40 MG tablet Commonly known as: MEVACOR Take 40 mg by mouth 2 (two) times daily.   magic mouthwash (nystatin, hydrocortisone, diphenhydrAMINE) suspension Swish and spit 5 mLs 3 (three) times daily as needed for mouth pain.   traZODone 100 MG tablet Commonly known as: DESYREL TAKE 1 TABLET BY MOUTH EVERY NIGHT AT BEDTIME        Allergies:  Allergies  Allergen Reactions   Keflex [Cephalexin] Nausea Only    Family History: Family History  Problem Relation Age of Onset   Dementia Mother    Stroke Father    Hypertension Father    Cancer Neg Hx    Diabetes Neg Hx     Social History:  reports that she has never smoked. She has never used smokeless tobacco. She reports that she does not drink alcohol and does not use drugs.   Physical Exam: BP (!) 160/75   Pulse 68   Ht 5' (1.524 m)   Wt 139 lb (63 kg)   BMI 27.15 kg/m   Constitutional:  Alert and oriented, No acute distress. HEENT: [place] AT, moist mucus membranes.  Trachea midline, no masses. Cardiovascular: No clubbing, cyanosis, or edema. Respiratory: Normal respiratory effort, no increased work of breathing. Skin: No rashes, bruises or suspicious lesions. Neurologic: Grossly intact, no focal deficits, moving all 4 extremities. Psychiatric: Normal mood and affect.  Laboratory Data: Creatinine 0.6 - 1.1 mg/dL 1.2 High      Urinalysis Results for orders placed or performed in visit on 02/18/21  Microscopic Examination   Urine  Result Value Ref Range    WBC, UA 0-5 0 - 5 /hpf   RBC >30 (A) 0 - 2 /hpf   Epithelial Cells (non renal) 0-10 0 - 10 /hpf   Bacteria, UA Few None seen/Few  Urinalysis, Complete  Result Value Ref Range   Specific Gravity, UA 1.020 1.005 - 1.030   pH, UA 5.5 5.0 - 7.5   Color, UA Yellow Yellow   Appearance Ur Hazy (A) Clear   Leukocytes,UA Trace (A) Negative   Protein,UA Negative Negative/Trace   Glucose, UA Negative Negative   Ketones, UA Negative Negative   RBC, UA 3+ (A) Negative   Bilirubin, UA Negative Negative   Urobilinogen, Ur 0.2 0.2 - 1.0 mg/dL   Nitrite, UA Negative Negative   Microscopic Examination See below:     Pertinent Imaging: RUS pending tomorrow   Assessment & Plan:    1. Right Staghorn calculus Now that her  left side has been addressed, we will plan to move forward with her right-sided full staghorn calculus.  I recommended right PCNL, this time with multiple access sites in order to try to reduce the number of procedures.  Her infundibular anatomy on this side seems to more favorable.  We reviewed the risk including risk of bleeding, infection, damage to structures, possible need for further procedures, ureteral stent, and blood transfusion amongst others.  All questions answered  Preoperative UA urine culture.  We will follow-up with her renal ultrasound was scheduled for tomorrow to ensure that her left-sided kidney is not obstructed and healthy prior to moving forward with her right. - Urinalysis, Complete - CULTURE, URINE COMPREHENSIVE  2. Acute kidney injury ([place]) Bump in creatinine in early June up to 1.6, now trending back down to 1.2 which is reassuring  Follow-up renal ultrasound   Hollice Espy, MD  [facility] [address], [place], [place] [address] [phone]

## 2021-02-18 NOTE — Patient Instructions (Signed)
Percutaneous Nephrolithotomy Percutaneous nephrolithotomy is a procedure to remove kidney stones. Kidney stones are deposits that form inside your kidneys and can cause pain. You may need this procedure if: You have large kidney stones. Kidney stones that are bigger than 2 cm (0.78 in.) wide may require this procedure. Your kidney stones are oddly shaped. Other treatments have not been successful in helping the kidney stones to pass. You have developed an infection due to the kidney stones. Tell a health care provider about: Any allergies you have. All medicines you are taking, including vitamins, herbs, eye drops, creams, and over-the-counter medicines. Any problems you or family members have had with anesthetic medicines. Any blood disorders you have. Any surgeries you have had. Any medical conditions you have. Whether you are pregnant or may be pregnant. Whether you use any tobacco products, including cigarettes, chewing tobacco, or e-cigarettes. What are the risks? Generally, this is a safe procedure. However, problems may occur, including: Infection. Bleeding. This may include blood in your urine. Allergic reactions to medicines. Damage to other structures or organs. Kidney damage. Holes in the kidney. These often heal on their own. Numbness or tingling in the affected area. Inability to remove all the stones. You may need a different procedure to complete treatment. What happens before the procedure? Staying hydrated Follow instructions from your health care provider about hydration, which may include: Up to 2 hours before the procedure - you may continue to drink clear liquids, such as water, clear fruit juice, black coffee, and plain tea.  Eating and drinking restrictions Follow instructions from your health care provider about eating and drinking, which may include: 8 hours before the procedure - stop eating heavy meals or foods, such as meat, fried foods, or fatty foods. 6  hours before the procedure - stop eating light meals or foods, such as toast or cereal. 6 hours before the procedure - stop drinking milk or drinks that contain milk. 2 hours before the procedure - stop drinking clear liquids. Medicines Ask your health care provider about: Changing or stopping your regular medicines. This is especially important if you are taking diabetes medicines or blood thinners. Taking medicines such as aspirin and ibuprofen. These medicines can thin your blood. Do not take these medicines unless your health care provider tells you to take them. Taking over-the-counter medicines, vitamins, herbs, and supplements. Tests You may have tests, including: Blood tests. Urine tests. Tests to check how your heart is working. Imaging studies. These are used to identify: The size and number (stone burden) of the kidney stones. The position of the kidney stones. General instructions Plan to have someone take you home from the hospital or clinic. Plan to have a responsible adult care for you for at least 24 hours after you leave the hospital or clinic. This is important. Ask your health care provider how your surgical site will be marked or identified. Ask your health care provider what steps will be taken to help prevent infection. These may include: Removing hair at the surgery site. Washing skin with a germ-killing soap. Taking antibiotic medicine. What happens during the procedure?  An IV will be inserted into one of your veins. The site of the procedure will be marked. You will be given one or more of the following: A medicine to help you relax (sedative). A medicine to numb the area (local anesthetic). A medicine to make you fall asleep (general anesthetic). A medicine that is injected into your spine to numb the area below  and slightly above the injection site (spinal anesthetic). A medicine that is injected into an area of your body to numb everything below the  injection site (regional anesthetic). A thin tube (urinary catheter) will be put in your bladder to drain urine during and after the procedure. Your surgeon will make a small cut (incision) in your lower back. A tube will be inserted through the incision into your kidney. Each kidney stone will be removed through this tube. Larger stones may need to be broken up with a high-intensity light beam (laser) or other tools. After all of the stones have been removed, your health care provider may put in tubes to drain your bladder. Based on your condition: An internal tube, called a stent, may be put in your ureter. This will help drain urine from your kidney to your bladder. A surgical drain (nephrostomy tube) may be put in your kidney. The tube comes out through the incision in your lower back. This will help to drain urine or any fluid that builds up while your kidney heals. Part of the incision may be closed with stitches (sutures). A bandage (dressing) will be placed over the incision area. The procedure may vary among health care providers and hospitals. What happens after the procedure? Your blood pressure, heart rate, breathing rate, and blood oxygen level will be monitored until you leave the hospital or clinic. You may be given medicine for pain. You will be shown how to do breathing exercises, such as coughing and breathing deeply. These will help to prevent pneumonia. You will be encouraged to walk. Walking helps to prevent blood clots. Your stent and urinary catheter will be removed after 1-2 days if there is only a small amount of blood in your urine. You will be taught how to care for the catheter or nephrostomy tube, if you have them. Do not drive for 24 hours if you were given a sedative during your procedure. Summary Percutaneous nephrolithotomy is a procedure to remove kidney stones. Ask your health care provider about changing or stopping your regular medicines. Before surgery,  follow instructions from your health care provider about eating and drinking. Plan to have someone take you home from the hospital or clinic. This information is not intended to replace advice given to you by your health care provider. Make sure you discuss any questions you have with your healthcare provider. Document Revised: 09/21/2018 Document Reviewed: 02/15/2018 [employer] Patient Education  [employer].

## 2021-02-19 ENCOUNTER — Ambulatory Visit
Admission: RE | Admit: 2021-02-19 | Discharge: 2021-02-19 | Disposition: A | Discharge status: Home / Self Care | Payer: PPO | Source: Ambulatory Visit | Attending: Internal Medicine | Admitting: Internal Medicine

## 2021-02-19 DIAGNOSIS — N179 Acute kidney failure, unspecified: Secondary | ICD-10-CM | POA: Diagnosis not present

## 2021-02-20 LAB — CULTURE, URINE COMPREHENSIVE

## 2021-02-24 ENCOUNTER — Other Ambulatory Visit: Payer: Self-pay | Admitting: Urology

## 2021-02-24 DIAGNOSIS — N200 Calculus of kidney: Secondary | ICD-10-CM

## 2021-03-03 DIAGNOSIS — H40003 Preglaucoma, unspecified, bilateral: Secondary | ICD-10-CM | POA: Diagnosis not present

## 2021-03-13 ENCOUNTER — Other Ambulatory Visit: Payer: Self-pay | Admitting: Family Medicine

## 2021-03-13 DIAGNOSIS — N200 Calculus of kidney: Secondary | ICD-10-CM

## 2021-03-13 NOTE — Telephone Encounter (Signed)
Prescriber not at this practice.

## 2021-04-09 ENCOUNTER — Other Ambulatory Visit: Payer: Self-pay

## 2021-04-09 ENCOUNTER — Encounter
Admission: RE | Admit: 2021-04-09 | Discharge: 2021-04-09 | Disposition: A | Discharge status: Home / Self Care | Payer: PPO | Source: Ambulatory Visit | Attending: Urology | Admitting: Urology

## 2021-04-09 NOTE — Patient Instructions (Signed)
Your procedure is scheduled on:04-20-21 Monday Report to the Registration Desk on the 1st floor of the [facility].Then proceed to the 2nd floor Surgery Desk in the [facility] To find out your arrival time, please call [phone] between 1PM - 3PM on:04-17-21 Friday  REMEMBER: Instructions that are not followed completely may result in serious medical risk, up to and including death; or upon the discretion of your surgeon and anesthesiologist your surgery may need to be rescheduled.  Do not eat food OR drink any liquids after midnight the night before surgery.  No gum chewing, lozengers or hard candies.  TAKE THESE MEDICATIONS THE MORNING OF SURGERY WITH A SIP OF WATER: -Pepcid (Famotidine)  One week prior to surgery: Stop Anti-inflammatories (NSAIDS) such as Advil, Aleve, Ibuprofen, Motrin, Naproxen, Naprosyn and Aspirin based products such as Excedrin, Goodys Powder, BC Powder.You may however, continue to take Tylenol if needed for pain up until the day of surgery.  Stop ANY OVER THE COUNTER supplements/vitamins 7 days prior to surgery (Vitamin B12 and Calcium+Vitamin D)  No Alcohol for 24 hours before or after surgery.  No Smoking including e-cigarettes for 24 hours prior to surgery.  No chewable tobacco products for at least 6 hours prior to surgery.  No nicotine patches on the day of surgery.  Do not use any "recreational" drugs for at least a week prior to your surgery.  Please be advised that the combination of cocaine and anesthesia may have negative outcomes, up to and including death. If you test positive for cocaine, your surgery will be cancelled.  On the morning of surgery brush your teeth with toothpaste and water, you may rinse your mouth with mouthwash if you wish. Do not swallow any toothpaste or mouthwash.  Do not wear jewelry, make-up, hairpins, clips or nail polish.  Do not wear lotions, powders, or perfumes.   Do not shave body from the neck down 48 hours  prior to surgery just in case you cut yourself which could leave a site for infection.  Also, freshly shaved skin may become irritated if using the CHG soap.  Contact lenses, hearing aids and dentures may not be worn into surgery.  Do not bring valuables to the hospital. [facility] is not responsible for any missing/lost belongings or valuables.   Use CHG Soap as directed on instruction sheet.  Notify your doctor if there is any change in your medical condition (cold, fever, infection).  Wear comfortable clothing (specific to your surgery type) to the hospital.  After surgery, you can help prevent lung complications by doing breathing exercises.  Take deep breaths and cough every 1-2 hours. Your doctor may order a device called an Incentive Spirometer to help you take deep breaths. When coughing or sneezing, hold a pillow firmly against your incision with both hands. This is called "splinting." Doing this helps protect your incision. It also decreases belly discomfort.  If you are being admitted to the hospital overnight, leave your suitcase in the car. After surgery it may be brought to your room.  If you are being discharged the day of surgery, you will not be allowed to drive home. You will need a responsible adult (18 years or older) to drive you home and stay with you that night.   If you are taking public transportation, you will need to have a responsible adult (18 years or older) with you. Please confirm with your physician that it is acceptable to use public transportation.   Please call  the Pre-admissions Testing Dept. at [phone] if you have any questions about these instructions.  Surgery Visitation Policy:  Patients undergoing a surgery or procedure may have one family member or support person with them as long as that person is not COVID-19 positive or experiencing its symptoms.  That person may remain in the waiting area during the procedure.  Inpatient  Visitation:    Visiting hours are 7 a.m. to 8 p.m. Inpatients will be allowed two visitors daily. The visitors may change each day during the patient's stay. No visitors under the age of 95. Any visitor under the age of 30 must be accompanied by an adult. The visitor must pass COVID-19 screenings, use hand sanitizer when entering and exiting the patient's room and wear a mask at all times, including in the patient's room. Patients must also wear a mask when staff or their visitor are in the room. Masking is required regardless of vaccination status.

## 2021-04-10 ENCOUNTER — Encounter
Admission: RE | Admit: 2021-04-10 | Discharge: 2021-04-10 | Disposition: A | Discharge status: Home / Self Care | Payer: PPO | Source: Ambulatory Visit | Attending: Urology | Admitting: Urology

## 2021-04-10 DIAGNOSIS — I10 Essential (primary) hypertension: Secondary | ICD-10-CM | POA: Insufficient documentation

## 2021-04-10 DIAGNOSIS — Z01818 Encounter for other preprocedural examination: Secondary | ICD-10-CM | POA: Diagnosis not present

## 2021-04-10 DIAGNOSIS — Z01810 Encounter for preprocedural cardiovascular examination: Secondary | ICD-10-CM

## 2021-04-10 LAB — TYPE AND SCREEN
ABO/RH(D): O POS
Antibody Screen: NEGATIVE

## 2021-04-13 ENCOUNTER — Inpatient Hospital Stay: Admission: RE | Admit: 2021-04-13 | Payer: PPO | Source: Ambulatory Visit

## 2021-04-16 ENCOUNTER — Other Ambulatory Visit: Payer: Self-pay | Admitting: Radiology

## 2021-04-17 ENCOUNTER — Other Ambulatory Visit
Admission: RE | Admit: 2021-04-17 | Discharge: 2021-04-17 | Disposition: A | Discharge status: Home / Self Care | Payer: PPO | Source: Ambulatory Visit | Attending: Urology | Admitting: Urology

## 2021-04-17 ENCOUNTER — Other Ambulatory Visit: Payer: Self-pay

## 2021-04-17 DIAGNOSIS — Z20822 Contact with and (suspected) exposure to covid-19: Secondary | ICD-10-CM | POA: Diagnosis not present

## 2021-04-17 DIAGNOSIS — Z01812 Encounter for preprocedural laboratory examination: Secondary | ICD-10-CM | POA: Diagnosis not present

## 2021-04-17 LAB — SARS CORONAVIRUS 2 (TAT 6-24 HRS): SARS Coronavirus 2: NEGATIVE

## 2021-04-20 ENCOUNTER — Other Ambulatory Visit: Payer: Self-pay

## 2021-04-20 ENCOUNTER — Ambulatory Visit: Payer: PPO

## 2021-04-20 ENCOUNTER — Ambulatory Visit
Admission: RE | Admit: 2021-04-20 | Discharge: 2021-04-20 | Disposition: A | Discharge status: Home / Self Care | Payer: PPO | Source: Ambulatory Visit | Attending: Urology | Admitting: Urology

## 2021-04-20 ENCOUNTER — Ambulatory Visit: Payer: PPO | Admitting: Registered Nurse

## 2021-04-20 ENCOUNTER — Encounter: Payer: Self-pay | Admitting: Radiology

## 2021-04-20 ENCOUNTER — Encounter: Payer: Self-pay | Admitting: Urology

## 2021-04-20 ENCOUNTER — Encounter
Admission: RE | Disposition: A | Discharge status: Home / Self Care | Payer: Self-pay | Source: Ambulatory Visit | Attending: Urology

## 2021-04-20 ENCOUNTER — Observation Stay
Admission: RE | Admit: 2021-04-20 | Discharge: 2021-04-22 | Disposition: A | Discharge status: Home / Self Care | Payer: PPO | Source: Ambulatory Visit | Attending: Urology | Admitting: Urology

## 2021-04-20 DIAGNOSIS — N200 Calculus of kidney: Principal | ICD-10-CM

## 2021-04-20 DIAGNOSIS — N390 Urinary tract infection, site not specified: Secondary | ICD-10-CM | POA: Diagnosis not present

## 2021-04-20 DIAGNOSIS — I10 Essential (primary) hypertension: Secondary | ICD-10-CM | POA: Diagnosis not present

## 2021-04-20 DIAGNOSIS — Z8616 Personal history of COVID-19: Secondary | ICD-10-CM | POA: Insufficient documentation

## 2021-04-20 DIAGNOSIS — Z79899 Other long term (current) drug therapy: Secondary | ICD-10-CM | POA: Insufficient documentation

## 2021-04-20 DIAGNOSIS — N179 Acute kidney failure, unspecified: Secondary | ICD-10-CM | POA: Diagnosis not present

## 2021-04-20 HISTORY — PX: HOLMIUM LASER APPLICATION: SHX5852

## 2021-04-20 HISTORY — PX: NEPHROLITHOTOMY: SHX5134

## 2021-04-20 HISTORY — PX: IR URETERAL STENT RIGHT NEW ACCESS W/O SEP NEPHROSTOMY CATH: IMG6076

## 2021-04-20 LAB — CBC
HCT: 42.1 % (ref 36.0–46.0)
Hemoglobin: 14.1 g/dL (ref 12.0–15.0)
MCH: 29 pg (ref 26.0–34.0)
MCHC: 33.5 g/dL (ref 30.0–36.0)
MCV: 86.6 fL (ref 80.0–100.0)
Platelets: 307 10*3/uL (ref 150–400)
RBC: 4.86 MIL/uL (ref 3.87–5.11)
RDW: 13.4 % (ref 11.5–15.5)
WBC: 7.6 10*3/uL (ref 4.0–10.5)
nRBC: 0 % (ref 0.0–0.2)

## 2021-04-20 LAB — BASIC METABOLIC PANEL
Anion gap: 9 (ref 5–15)
BUN: 24 mg/dL — ABNORMAL HIGH (ref 8–23)
CO2: 27 mmol/L (ref 22–32)
Calcium: 9.3 mg/dL (ref 8.9–10.3)
Chloride: 103 mmol/L (ref 98–111)
Creatinine, Ser: 1.09 mg/dL — ABNORMAL HIGH (ref 0.44–1.00)
GFR, Estimated: 55 mL/min — ABNORMAL LOW (ref >60)
Glucose, Bld: 106 mg/dL — ABNORMAL HIGH (ref 70–99)
Potassium: 4.2 mmol/L (ref 3.5–5.1)
Sodium: 139 mmol/L (ref 135–145)

## 2021-04-20 LAB — PROTIME-INR
INR: 0.9 (ref 0.8–1.2)
Prothrombin Time: 11.9 seconds (ref 11.4–15.2)

## 2021-04-20 SURGERY — NEPHROLITHOTOMY PERCUTANEOUS
Anesthesia: General | Site: Back | Laterality: Right

## 2021-04-20 MED ORDER — MORPHINE SULFATE (PF) 2 MG/ML IV SOLN
2.0000 mg | INTRAVENOUS | Status: DC | PRN
  Administered 2021-04-20 (×2): 2 mg via INTRAVENOUS
  Filled 2021-04-20 (×2): qty 1

## 2021-04-20 MED ORDER — LIDOCAINE HCL 1 % IJ SOLN
INTRAMUSCULAR | Status: AC
  Administered 2021-04-20: 10 mL
  Filled 2021-04-20: qty 20

## 2021-04-20 MED ORDER — CHLORHEXIDINE GLUCONATE 0.12 % MT SOLN
OROMUCOSAL | Status: AC
  Filled 2021-04-20: qty 15

## 2021-04-20 MED ORDER — FAMOTIDINE 20 MG PO TABS
20.0000 mg | ORAL_TABLET | Freq: Two times a day (BID) | ORAL | Status: DC
  Administered 2021-04-20 – 2021-04-22 (×4): 20 mg via ORAL
  Filled 2021-04-20 (×4): qty 1

## 2021-04-20 MED ORDER — ROCURONIUM BROMIDE 10 MG/ML (PF) SYRINGE
PREFILLED_SYRINGE | INTRAVENOUS | Status: AC
  Filled 2021-04-20: qty 10

## 2021-04-20 MED ORDER — ACETAMINOPHEN 325 MG PO TABS
650.0000 mg | ORAL_TABLET | ORAL | Status: DC | PRN
  Filled 2021-04-20: qty 2

## 2021-04-20 MED ORDER — EPHEDRINE SULFATE 50 MG/ML IJ SOLN
INTRAMUSCULAR | Status: DC | PRN
  Administered 2021-04-20: 5 mg via INTRAVENOUS
  Administered 2021-04-20 (×3): 2.5 mg via INTRAVENOUS
  Administered 2021-04-20: 5 mg via INTRAVENOUS

## 2021-04-20 MED ORDER — FENTANYL CITRATE (PF) 100 MCG/2ML IJ SOLN
INTRAMUSCULAR | Status: AC
  Filled 2021-04-20: qty 2

## 2021-04-20 MED ORDER — HEPARIN SODIUM (PORCINE) 5000 UNIT/ML IJ SOLN
5000.0000 [IU] | Freq: Three times a day (TID) | INTRAMUSCULAR | Status: DC
  Administered 2021-04-20 – 2021-04-21 (×3): 5000 [IU] via SUBCUTANEOUS
  Filled 2021-04-20 (×3): qty 1

## 2021-04-20 MED ORDER — ROCURONIUM BROMIDE 100 MG/10ML IV SOLN
INTRAVENOUS | Status: DC | PRN
  Administered 2021-04-20: 10 mg via INTRAVENOUS
  Administered 2021-04-20: 50 mg via INTRAVENOUS

## 2021-04-20 MED ORDER — FENTANYL CITRATE (PF) 100 MCG/2ML IJ SOLN
25.0000 ug | INTRAMUSCULAR | Status: DC | PRN
  Administered 2021-04-20: 50 ug via INTRAVENOUS
  Administered 2021-04-20 (×4): 25 ug via INTRAVENOUS

## 2021-04-20 MED ORDER — STERILE WATER FOR IRRIGATION IR SOLN
Status: DC | PRN
  Administered 2021-04-20: 500 mL

## 2021-04-20 MED ORDER — SODIUM CHLORIDE 0.9% FLUSH
5.0000 mL | Freq: Three times a day (TID) | INTRAVENOUS | Status: DC
  Administered 2021-04-20 – 2021-04-21 (×4): 5 mL

## 2021-04-20 MED ORDER — OXYBUTYNIN CHLORIDE 5 MG PO TABS: 5.0000 mg | ORAL_TABLET | Freq: Three times a day (TID) | ORAL | Status: DC | PRN

## 2021-04-20 MED ORDER — CHLORHEXIDINE GLUCONATE CLOTH 2 % EX PADS
6.0000 | MEDICATED_PAD | Freq: Every day | CUTANEOUS | Status: DC
  Administered 2021-04-20 – 2021-04-21 (×2): 6 via TOPICAL

## 2021-04-20 MED ORDER — DEXAMETHASONE SODIUM PHOSPHATE 10 MG/ML IJ SOLN
INTRAMUSCULAR | Status: DC | PRN
  Administered 2021-04-20: 5 mg via INTRAVENOUS

## 2021-04-20 MED ORDER — CEFAZOLIN SODIUM-DEXTROSE 2-4 GM/100ML-% IV SOLN
2.0000 g | INTRAVENOUS | Status: AC
  Administered 2021-04-20: 2 g via INTRAVENOUS

## 2021-04-20 MED ORDER — ONDANSETRON HCL 4 MG/2ML IJ SOLN: 4.0000 mg | Freq: Once | INTRAMUSCULAR | Status: DC | PRN

## 2021-04-20 MED ORDER — PROPOFOL 10 MG/ML IV BOLUS
INTRAVENOUS | Status: DC | PRN
  Administered 2021-04-20: 100 mg via INTRAVENOUS

## 2021-04-20 MED ORDER — LEVOFLOXACIN IN D5W 250 MG/50ML IV SOLN
INTRAVENOUS | Status: DC | PRN
  Administered 2021-04-20: 500 mg via INTRAVENOUS

## 2021-04-20 MED ORDER — SUGAMMADEX SODIUM 200 MG/2ML IV SOLN
INTRAVENOUS | Status: DC | PRN
  Administered 2021-04-20: 200 mg via INTRAVENOUS

## 2021-04-20 MED ORDER — MIDAZOLAM HCL 5 MG/5ML IJ SOLN
INTRAMUSCULAR | Status: DC | PRN
  Administered 2021-04-20 (×2): .5 mg via INTRAVENOUS
  Administered 2021-04-20: 1 mg via INTRAVENOUS

## 2021-04-20 MED ORDER — ONDANSETRON HCL 4 MG/2ML IJ SOLN
INTRAMUSCULAR | Status: AC
  Filled 2021-04-20: qty 2

## 2021-04-20 MED ORDER — OXYCODONE-ACETAMINOPHEN 5-325 MG PO TABS
1.0000 | ORAL_TABLET | ORAL | Status: DC | PRN
Start: 2021-04-20 — End: 2021-04-22
  Administered 2021-04-20: 1 via ORAL
  Administered 2021-04-21: 2 via ORAL
  Administered 2021-04-21 (×2): 1 via ORAL
  Administered 2021-04-21 – 2021-04-22 (×2): 2 via ORAL
  Filled 2021-04-20 (×3): qty 2
  Filled 2021-04-20 (×2): qty 1
  Filled 2021-04-20: qty 2

## 2021-04-20 MED ORDER — MIDAZOLAM HCL 2 MG/2ML IJ SOLN
INTRAMUSCULAR | Status: AC
  Filled 2021-04-20: qty 2

## 2021-04-20 MED ORDER — LIDOCAINE 1% INJECTION FOR CIRCUMCISION
5.0000 mL | INJECTION | Freq: Once | INTRAVENOUS | Status: DC
  Filled 2021-04-20: qty 5

## 2021-04-20 MED ORDER — IOHEXOL 300 MG/ML  SOLN
4.0000 mL | Freq: Once | INTRAMUSCULAR | Status: DC | PRN
  Filled 2021-04-20: qty 5

## 2021-04-20 MED ORDER — LIDOCAINE HCL (PF) 2 % IJ SOLN
INTRAMUSCULAR | Status: AC
  Filled 2021-04-20: qty 5

## 2021-04-20 MED ORDER — ONDANSETRON HCL 4 MG/2ML IJ SOLN
4.0000 mg | INTRAMUSCULAR | Status: DC | PRN
  Administered 2021-04-20: 4 mg via INTRAVENOUS
  Filled 2021-04-20: qty 2

## 2021-04-20 MED ORDER — PROPOFOL 10 MG/ML IV BOLUS
INTRAVENOUS | Status: AC
  Filled 2021-04-20: qty 20

## 2021-04-20 MED ORDER — LIDOCAINE HCL (CARDIAC) PF 100 MG/5ML IV SOSY
PREFILLED_SYRINGE | INTRAVENOUS | Status: DC | PRN
  Administered 2021-04-20: 60 mg via INTRAVENOUS
  Administered 2021-04-20: 40 mg via INTRAVENOUS

## 2021-04-20 MED ORDER — DEXMEDETOMIDINE (PRECEDEX) IN NS 20 MCG/5ML (4 MCG/ML) IV SYRINGE
PREFILLED_SYRINGE | INTRAVENOUS | Status: DC | PRN
  Administered 2021-04-20: 4 ug via INTRAVENOUS

## 2021-04-20 MED ORDER — DORZOLAMIDE HCL-TIMOLOL MAL 2-0.5 % OP SOLN
1.0000 [drp] | Freq: Two times a day (BID) | OPHTHALMIC | Status: DC
  Administered 2021-04-20 – 2021-04-22 (×4): 1 [drp] via OPHTHALMIC
  Filled 2021-04-20: qty 10

## 2021-04-20 MED ORDER — CEFAZOLIN SODIUM-DEXTROSE 1-4 GM/50ML-% IV SOLN
1.0000 g | Freq: Three times a day (TID) | INTRAVENOUS | Status: AC
  Administered 2021-04-20 – 2021-04-21 (×2): 1 g via INTRAVENOUS
  Filled 2021-04-20 (×3): qty 50

## 2021-04-20 MED ORDER — DIPHENHYDRAMINE HCL 50 MG/ML IJ SOLN: 12.5000 mg | Freq: Four times a day (QID) | INTRAMUSCULAR | Status: DC | PRN

## 2021-04-20 MED ORDER — ONDANSETRON HCL 4 MG/2ML IJ SOLN
INTRAMUSCULAR | Status: DC | PRN
  Administered 2021-04-20: 4 mg via INTRAVENOUS

## 2021-04-20 MED ORDER — AMLODIPINE BESYLATE 5 MG PO TABS
5.0000 mg | ORAL_TABLET | Freq: Every evening | ORAL | Status: DC
  Administered 2021-04-21: 5 mg via ORAL
  Filled 2021-04-20: qty 1

## 2021-04-20 MED ORDER — ACETAMINOPHEN 10 MG/ML IV SOLN
INTRAVENOUS | Status: DC | PRN
  Administered 2021-04-20: 1000 mg via INTRAVENOUS

## 2021-04-20 MED ORDER — IOHEXOL 180 MG/ML  SOLN
INTRAMUSCULAR | Status: DC | PRN
  Administered 2021-04-20: 40 mL

## 2021-04-20 MED ORDER — SODIUM CHLORIDE 0.9 % IV SOLN: INTRAVENOUS | Status: DC

## 2021-04-20 MED ORDER — DOCUSATE SODIUM 100 MG PO CAPS
100.0000 mg | ORAL_CAPSULE | Freq: Two times a day (BID) | ORAL | Status: DC
  Administered 2021-04-20 – 2021-04-22 (×4): 100 mg via ORAL
  Filled 2021-04-20 (×4): qty 1

## 2021-04-20 MED ORDER — TRAZODONE HCL 100 MG PO TABS
100.0000 mg | ORAL_TABLET | Freq: Every day | ORAL | Status: DC
  Administered 2021-04-20 – 2021-04-21 (×2): 100 mg via ORAL
  Filled 2021-04-20 (×2): qty 1

## 2021-04-20 MED ORDER — BELLADONNA ALKALOIDS-OPIUM 16.2-60 MG RE SUPP: 1.0000 | Freq: Four times a day (QID) | RECTAL | Status: DC | PRN

## 2021-04-20 MED ORDER — ORAL CARE MOUTH RINSE: 15.0000 mL | Freq: Once | OROMUCOSAL | Status: DC

## 2021-04-20 MED ORDER — PRAVASTATIN SODIUM 20 MG PO TABS
40.0000 mg | ORAL_TABLET | Freq: Every day | ORAL | Status: DC
  Administered 2021-04-20 – 2021-04-21 (×2): 40 mg via ORAL
  Filled 2021-04-20 (×2): qty 2

## 2021-04-20 MED ORDER — LEVOFLOXACIN IN D5W 500 MG/100ML IV SOLN: 500.0000 mg | INTRAVENOUS | Status: DC

## 2021-04-20 MED ORDER — LEVOFLOXACIN IN D5W 500 MG/100ML IV SOLN
INTRAVENOUS | Status: AC
  Filled 2021-04-20: qty 100

## 2021-04-20 MED ORDER — PHENYLEPHRINE HCL (PRESSORS) 10 MG/ML IV SOLN
INTRAVENOUS | Status: DC | PRN
  Administered 2021-04-20 (×3): 100 ug via INTRAVENOUS

## 2021-04-20 MED ORDER — FENTANYL CITRATE (PF) 100 MCG/2ML IJ SOLN
INTRAMUSCULAR | Status: DC | PRN
  Administered 2021-04-20 (×2): 25 ug via INTRAVENOUS

## 2021-04-20 MED ORDER — EPHEDRINE 5 MG/ML INJ
INTRAVENOUS | Status: AC
  Filled 2021-04-20: qty 5

## 2021-04-20 MED ORDER — DIPHENHYDRAMINE HCL 12.5 MG/5ML PO ELIX
12.5000 mg | ORAL_SOLUTION | Freq: Four times a day (QID) | ORAL | Status: DC | PRN
  Filled 2021-04-20: qty 5

## 2021-04-20 MED ORDER — FENTANYL CITRATE (PF) 100 MCG/2ML IJ SOLN
INTRAMUSCULAR | Status: DC | PRN
  Administered 2021-04-20 (×2): 25 ug via INTRAVENOUS
  Administered 2021-04-20: 50 ug via INTRAVENOUS

## 2021-04-20 MED ORDER — LACTATED RINGERS IV SOLN: INTRAVENOUS | Status: DC

## 2021-04-20 MED ORDER — SODIUM CHLORIDE 0.9 % IV SOLN
INTRAVENOUS | Status: DC | PRN
  Administered 2021-04-20: 15 ug/min via INTRAVENOUS

## 2021-04-20 MED ORDER — CHLORHEXIDINE GLUCONATE 0.12 % MT SOLN: 15.0000 mL | Freq: Once | OROMUCOSAL | Status: DC

## 2021-04-20 MED ORDER — ACETAMINOPHEN 10 MG/ML IV SOLN
INTRAVENOUS | Status: AC
  Filled 2021-04-20: qty 100

## 2021-04-20 MED ORDER — CEFAZOLIN SODIUM-DEXTROSE 2-4 GM/100ML-% IV SOLN
INTRAVENOUS | Status: AC
  Filled 2021-04-20: qty 100

## 2021-04-20 MED ORDER — SODIUM CHLORIDE 0.9 % IV SOLN
INTRAVENOUS | Status: DC
  Filled 2021-04-20: qty 1000

## 2021-04-20 MED ORDER — SODIUM CHLORIDE 0.9 % IR SOLN
Status: DC | PRN
  Administered 2021-04-20: 3000 mL
  Administered 2021-04-20: 6000 mL
  Administered 2021-04-20 (×2): 12000 mL

## 2021-04-20 SURGICAL SUPPLY — 70 items
ADAPTER IRRIG TUBE 2 SPIKE SOL (ADAPTER) ×6 IMPLANT
ADAPTER SCOPE UROLOK II (MISCELLANEOUS) ×3 IMPLANT
BAG URINE DRAIN 2000ML AR STRL (UROLOGICAL SUPPLIES) ×3 IMPLANT
BALLN NEPHROSTOMY 10X15 (UROLOGICAL SUPPLIES) ×3 IMPLANT
BALLN ULTRAXX (BALLOONS) ×3
BALLOON ULTRAXX (BALLOONS) ×2 IMPLANT
BASKET ZERO TIP 1.9FR (BASKET) IMPLANT
BLADE SURG 15 STRL LF DISP TIS (BLADE) ×2 IMPLANT
BLADE SURG 15 STRL SS (BLADE) ×1
CATH COUNCIL 22FR (CATHETERS) IMPLANT
CATH FOLEY 2W COUNCIL 20FR 5CC (CATHETERS) IMPLANT
CATH FOLEY 2W COUNCIL 5CC 18FR (CATHETERS) ×6 IMPLANT
CATH STENT KAYE NEPHR TAMP (CATHETERS) IMPLANT
CATH URET FLEX-TIP 2 LUMEN 10F (CATHETERS) IMPLANT
CATH URETL 5X70 OPEN END (CATHETERS) ×3 IMPLANT
CATH URETL OPEN 5X70 (CATHETERS) ×3 IMPLANT
CATH/STENT KAYE NEPHR TAMP (CATHETERS)
CHLORAPREP W/TINT 26 (MISCELLANEOUS) ×3 IMPLANT
CNTNR SPEC 2.5X3XGRAD LEK (MISCELLANEOUS) ×2
CONT SPEC 4OZ STER OR WHT (MISCELLANEOUS) ×1
CONTAINER SPEC 2.5X3XGRAD LEK (MISCELLANEOUS) ×2 IMPLANT
DRAPE 3/4 80X56 (DRAPES) ×3 IMPLANT
DRAPE C ARM PK CFD 31 SPINE (DRAPES) ×3 IMPLANT
DRAPE C-ARM XRAY 36X54 (DRAPES) IMPLANT
DRAPE SURG 17X11 SM STRL (DRAPES) ×12 IMPLANT
GAUZE 4X4 16PLY ~~LOC~~+RFID DBL (SPONGE) ×3 IMPLANT
GAUZE SPONGE 4X4 12PLY STRL (GAUZE/BANDAGES/DRESSINGS) ×3 IMPLANT
GLIDEWIRE STIFF .35X180X3 HYDR (WIRE) IMPLANT
GLOVE SURG ENC MOIS LTX SZ6.5 (GLOVE) ×6 IMPLANT
GLOVE SURG UNDER POLY LF SZ6.5 (GLOVE) ×6 IMPLANT
GOWN STRL REUS W/ TWL LRG LVL3 (GOWN DISPOSABLE) ×4 IMPLANT
GOWN STRL REUS W/TWL LRG LVL3 (GOWN DISPOSABLE) ×2
GUIDEWIRE GREEN .038 145CM (MISCELLANEOUS) ×3 IMPLANT
GUIDEWIRE INTRO SET STRAIGHT (WIRE) ×3 IMPLANT
GUIDEWIRE STR DUAL SENSOR (WIRE) ×6 IMPLANT
GUIDEWIRE STR ZIPWIRE 035X150 (MISCELLANEOUS) IMPLANT
GUIDEWIRE SUPER STIFF (WIRE) ×6 IMPLANT
HOLDER FOLEY CATH W/STRAP (MISCELLANEOUS) ×3 IMPLANT
INTRODUCER DILATOR DOUBLE (INTRODUCER) ×3 IMPLANT
IV NS IRRIG 3000ML ARTHROMATIC (IV SOLUTION) ×33 IMPLANT
KIT PROBE TRILOGY 3.9X350 (MISCELLANEOUS) ×3 IMPLANT
MANIFOLD NEPTUNE II (INSTRUMENTS) ×6 IMPLANT
MAT ABSORB  FLUID 56X50 GRAY (MISCELLANEOUS) ×2
MAT ABSORB FLUID 56X50 GRAY (MISCELLANEOUS) ×4 IMPLANT
NDL FASCIA INCISION 18GA (NEEDLE) ×3 IMPLANT
PACK BASIN MINOR ARMC (MISCELLANEOUS) ×3 IMPLANT
PAD ABD DERMACEA PRESS 5X9 (GAUZE/BANDAGES/DRESSINGS) ×6 IMPLANT
PAD PREP 24X41 OB/GYN DISP (PERSONAL CARE ITEMS) ×3 IMPLANT
PLUG CATH AND CAP STER (CATHETERS) ×3 IMPLANT
SET IRRIGATING DISP (SET/KITS/TRAYS/PACK) ×3 IMPLANT
SHEET NEURO XL SOL CTL (MISCELLANEOUS) ×3 IMPLANT
SPONGE DRAIN TRACH 4X4 STRL 2S (GAUZE/BANDAGES/DRESSINGS) ×3 IMPLANT
STAPLER SKIN PROX 35W (STAPLE) ×3 IMPLANT
STENT URET 6FRX24 CONTOUR (STENTS) ×3 IMPLANT
STENT URET 6FRX26 CONTOUR (STENTS) IMPLANT
STRAP SAFETY 5IN WIDE (MISCELLANEOUS) ×3 IMPLANT
SURGILUBE 2OZ TUBE FLIPTOP (MISCELLANEOUS) ×3 IMPLANT
SUT SILK 0 SH 30 (SUTURE) ×6 IMPLANT
SYR 10ML LL (SYRINGE) ×3 IMPLANT
SYR 20ML LL LF (SYRINGE) ×3 IMPLANT
SYR 30ML LL (SYRINGE) ×3 IMPLANT
SYR TOOMEY IRRIG 70ML (MISCELLANEOUS) ×3
SYRINGE TOOMEY IRRIG 70ML (MISCELLANEOUS) ×2 IMPLANT
TAPE CLOTH 3X10 WHT NS LF (GAUZE/BANDAGES/DRESSINGS) IMPLANT
TAPE MICROFOAM 4IN (TAPE) ×3 IMPLANT
TRACTIP FLEXIVA PULSE ID 200 (Laser) ×3 IMPLANT
TRAY FOLEY MTR SLVR 16FR STAT (SET/KITS/TRAYS/PACK) ×3 IMPLANT
TUBING CONNECTING 10 (TUBING) ×3 IMPLANT
WATER STERILE IRR 1000ML POUR (IV SOLUTION) ×3 IMPLANT
WATER STERILE IRR 500ML POUR (IV SOLUTION) ×3 IMPLANT

## 2021-04-20 NOTE — Progress Notes (Signed)
Patient clinically stable post PCNL placement per DR Vernard Gambles. Vitals stable pre and post procedure. Awaiting OR this am post PCNL. Report given to [facility] in specials post procedure. Received Versed 2 mg along with Fentanyl 100 mcg IV for procedure.

## 2021-04-20 NOTE — Op Note (Signed)
Date of procedure: 04/20/21  Preoperative diagnosis:  Right staghorn calculus, greater than 5 cm   Postoperative diagnosis:  Same as above  Procedure: Right percutaneous nephrolithotomy Right antegrade nephrostogram Right antegrade ureteral stent placement Laser lithotripsy Interpretation fluoroscopy less than 30 minutes  Surgeon: Hollice Espy, MD  Anesthesia: General  Complications: None  Intraoperative findings: Full staghorn calculus occupying entire collecting system.  Dual access in the lower and extreme upper pole, dilated both tracts and able to clear all the 2 calyces of stone burden.  EBL: Minimal  Specimens: Stone fragment  Drains: 16 French Foley catheter per urethra, 6 x 24 French double-J ureteral stent on right, 18 [place] council tip Foley catheter x2 as nephrostomy tubes in the upper and lower pole  Indication: Gabrielle Barnes is a 70 y.o. patient with full right staghorn.  After reviewing the management options for treatment, she elected to proceed with the above surgical procedure(s). We have discussed the potential benefits and risks of the procedure, side effects of the proposed treatment, the likelihood of the patient achieving the goals of the procedure, and any potential problems that might occur during the procedure or recuperation. Informed consent has been obtained.  Case was discussed with Dr. Vernard Gambles today who successfully placed 2 accesses.  Description of procedure:  The patient was taken to the operating room and on the stretcher, general anesthesia was induced.  A 16 French Foley catheter was placed using sterile technique.  The patient was placed in the prone position, prepped and draped in the usual sterile fashion, and preoperative antibiotics were administered. A preoperative time-out was performed.   Scout imaging revealed a full staghorn calculus with two 5 [place] open-ended ureteral catheters, and extreme upper and lower pole traversing the  stone all the way down to the level of the bladder.  I elected to begin at the lower pole.  The 5 [place] open-ended ureteral catheter was cannulated using a Super Stiff wire all the way down to the level of the bladder leaving the wire in place removing the open-ended ureteral catheter.  I then extended the incision to about a centimeter and a half longitudinally and used a fascial incisor needle to make a cruciate incision in the fascia.  A dual-lumen catheter was used to introduce a second sensor wire down to the level of the kidney.  A NephroMax balloon was then brought in and advanced to the level of the stone both visually and by palpation.  I then inflated the balloon to 18 cm of water which was left in place for several minutes for hemostasis.  The sheath was then advanced over the balloon which went easily.  The balloon was deflated and there was some mild bleeding but not significant.  The nephroscope was then brought in and I immediately encountered the lower pole stone.  A lithoclast device was used to fragment this lower pole stone and several calyces of this compound calyx until all the stone was obliterated.  Unfortunately, I could see the wires traversing a stenotic infundibulum but I was unable to advance my scope into the renal pelvis and was not able to access the renal pelvis nor any additional stone.  At this time, I elected to place a 5 [place] open-ended ureteral catheter over one of the wires as well as a 49 [place] council tip catheter to fill the tract and removed the sheath.  Hemostasis was actually quite good.  The nephrostomy/Foley balloon was in the lower pole calyx filled  with about 2 cc of diluted contrast solution.  Next, attention was turned to the upper pole access.  I cannulated this nephroureteral catheter using the same technique and gained access using the same exact procedure as outlined above.  We then nephroscope was brought in, it was able to be advanced immediately to the  level of the stone in the lithoclast device was used to remove all stone in this compound upper pole calyx.  Through this access, with some tricky navigation, I was able to navigate into the renal pelvis and clear all the stone at this location as well.  This was technically challenging but feasible.  I was able to also clear another midpole calyx.  There were 2 calyces of stone, 1 which appeared to be part of the lower pole system and one from the midpole system approximately the upper pole system of which I was unable to access.  I did exchange the nephroscope for a flexible cystoscope and try to navigate to these areas.  I did encounter some stone in the midpole calyx which I was able to treat using a 200 m laser fiber and dusting settings of 0.3 J and 60 Hz.  Again, there was approximately 95% of the stone cleared from the entirety of the collecting system with only 2 calyces of residual stone burden.  I was not able to treat these or access these particular locations through the access provided.  At this time, I was able to also advance the cystoscope down the proximal ureter and treat some small fragments.  Finally, nephrostogram was performed through the lower nephrostomy tube which showed no contrast extravasation other than through the nephrostomy tube tracts which is quite reassuring as well as down the ureter 2.  At this point time, I elected to remove the lower pole nephroureteral catheter and safety wire and leave only nephrostomy tube in place.  I elected to advance a 6 x 24 French double-J ureteral stent through the upper pole access which was placed fluoroscopically such that a full coil was noted both within the bladder and a partial coil in the renal pelvis.  I then advanced an 82 [place] council tip over a guidewire into the renal pelvis and the balloon was filled with 2 cc of dilute contrast.  Once adequate positioning of both the ureteral stent as well as nephrostomy tubes x2 were placed, I  removed all of the extra wires and sheaths.  Hemostasis was quite good.  The nephrostomy tubes were sutured in place.  I did 1 last nephrostogram through each of the nephrostomy tubes which showed good opacification of the collecting system and adequate stent placement.  I plugged the lower pole nephrostomy tube and dressed them using 4 x 4's, ABD pads and foam tape.  I left to the superior nephrostomy tube open to drainage.  The patient was then repositioned in the supine position on the stretcher, reversed of anesthesia and extubated.  She was taken to PACU in stable condition.  We will keep her overnight for observation.  We will likely remove her Foley and attempt clamp trial in the morning.  If she does well, hopefully we can remove her nephrostomy tubes tomorrow for discharge later in the day.     Hollice Espy, M.D.

## 2021-04-20 NOTE — H&P (Signed)
04/20/21 RRR CTAB     Gabrielle Barnes Sep 11, 1950 [id number]   Referring provider: Rusty Aus, MD [facility] [place],  [place] [address]       Chief Complaint  Patient presents with   Nephrolithiasis      Discuss stone management       HPI: 70 year old female with bilateral staghorn's, chronic UTIs who is now status post treatment of her left-sided stone burden with PCNL and ureteroscopy x2.  She presents today for discussion of management of her right-sided full staghorn calculus.  She notes today that she is scheduled for renal ultrasound tomorrow which is her 70th birthday.  This is ordered by her primary care physician due to elevation in her creatinine to 1.6 from baseline of 1.  Since trended back to 1.2 which is reassuring.  She has no UTI symptoms today but waxes and wanes with the symptoms.  Urinalysis is chronically positive.  She does have intermittent right flank pain from her stone.  She also notes some low left-sided back pain overlying her sacrum exacerbated with movement.  She is anxious to move forward with the goal of becoming stone free.  She is accompanied today by her daughter.     PMH:     Past Medical History:  Diagnosis Date   COVID-19 06/2020   Depression      after husbands death   GERD (gastroesophageal reflux disease)     History of chicken pox     History of kidney stones     History of measles     Hypertension     Migraine        Surgical History:      Past Surgical History:  Procedure Laterality Date   ABDOMINAL HYSTERECTOMY       APPENDECTOMY       CESAREAN SECTION       colonoscopy       CYSTOSCOPY WITH HOLMIUM LASER LITHOTRIPSY   08/18/2020    Procedure: HOLMIUM LASER LITHOTRIPSY;  Surgeon: Hollice Espy, MD;  Location: [facility];  Service: Urology;;   CYSTOSCOPY/URETEROSCOPY/HOLMIUM LASER/STENT PLACEMENT Left 09/15/2020    Procedure: CYSTOSCOPY/URETEROSCOPY/HOLMIUM LASER/STENT  Exchange;  Surgeon: Hollice Espy, MD;  Location: [facility];  Service: Urology;  Laterality: Left;   CYSTOSCOPY/URETEROSCOPY/HOLMIUM LASER/STENT PLACEMENT Left 10/16/2020    Procedure: CYSTOSCOPY/URETEROSCOPY/HOLMIUM LASER/STENT Exchange;  Surgeon: Hollice Espy, MD;  Location: [facility];  Service: Urology;  Laterality: Left;   IR NEPHROSTOMY PLACEMENT LEFT   08/18/2020   NEPHROLITHOTOMY Left 08/18/2020    Procedure: NEPHROLITHOTOMY PERCUTANEOUS;  Surgeon: Hollice Espy, MD;  Location: [facility];  Service: Urology;  Laterality: Left;  possibly appropriate for [place] Right 06/13/2020    Procedure: XI ROBOTIC LAPAROSCOPIC ASSISTED APPENDECTOMY, RIGHT COLECTOMY;  Surgeon: Ronny Bacon, MD;  Location: [facility];  Service: General;  Laterality: Right;      Home Medications:  Allergies as of 02/18/2021         Reactions    Keflex [cephalexin] Nausea Only            Medication List           Accurate as of February 18, 2021 11:59 PM. If you have any questions, ask your nurse or doctor.              STOP taking these medications     cephALEXin 500 MG capsule Commonly known as: Keflex Stopped by: Hollice Espy, MD  losartan-hydrochlorothiazide 50-12.5 MG tablet Commonly known as: HYZAAR Stopped by: Hollice Espy, MD    ondansetron 4 MG tablet Commonly known as: Zofran Stopped by: Hollice Espy, MD           TAKE these medications     alendronate 70 MG tablet Commonly known as: FOSAMAX Take 1 tablet (70 mg total) by mouth once a week. Take with a full glass of water on an empty stomach. What changed: when to take this    amLODipine 5 MG tablet Commonly known as: NORVASC Take 5 mg by mouth daily.    CALCIUM 600/VITAMIN D3 PO Take 1 tablet by mouth daily.    diphenoxylate-atropine 2.5-0.025 MG tablet Commonly known as: LOMOTIL Take 1 tablet by mouth 4 (four) times daily as needed for diarrhea or loose stools.     docusate sodium 100 MG capsule Commonly known as: COLACE Take 1 capsule (100 mg total) by mouth 2 (two) times daily.    dorzolamide-timolol 22.3-6.8 MG/ML ophthalmic solution Commonly known as: COSOPT Place 1 drop into both eyes 2 (two) times daily.    famotidine 20 MG tablet Commonly known as: PEPCID TAKE 1 TABLET BY MOUTH TWICE A DAY What changed:  how much to take when to take this reasons to take this    ibuprofen 800 MG tablet Commonly known as: ADVIL Take 1 tablet (800 mg total) by mouth every 8 (eight) hours as needed.    lovastatin 40 MG tablet Commonly known as: MEVACOR Take 40 mg by mouth 2 (two) times daily.    magic mouthwash (nystatin, hydrocortisone, diphenhydrAMINE) suspension Swish and spit 5 mLs 3 (three) times daily as needed for mouth pain.    traZODone 100 MG tablet Commonly known as: DESYREL TAKE 1 TABLET BY MOUTH EVERY NIGHT AT BEDTIME             Allergies:      Allergies  Allergen Reactions   Keflex [Cephalexin] Nausea Only      Family History:      Family History  Problem Relation Age of Onset   Dementia Mother     Stroke Father     Hypertension Father     Cancer Neg Hx     Diabetes Neg Hx        Social History:  reports that she has never smoked. She has never used smokeless tobacco. She reports that she does not drink alcohol and does not use drugs.     Physical Exam: BP (!) 160/75   Pulse 68   Ht 5' (1.524 m)   Wt 139 lb (63 kg)   BMI 27.15 kg/m   Constitutional:  Alert and oriented, No acute distress. HEENT: [place] AT, moist mucus membranes.  Trachea midline, no masses. Cardiovascular: No clubbing, cyanosis, or edema. Respiratory: Normal respiratory effort, no increased work of breathing. Skin: No rashes, bruises or suspicious lesions. Neurologic: Grossly intact, no focal deficits, moving all 4 extremities. Psychiatric: Normal mood and affect.   Laboratory Data: Creatinine 0.6 - 1.1 mg/dL 1.2 High         Urinalysis      Results for orders placed or performed in visit on 02/18/21  Microscopic Examination    Urine  Result Value Ref Range    WBC, UA 0-5 0 - 5 /hpf    RBC >30 (A) 0 - 2 /hpf    Epithelial Cells (non renal) 0-10 0 - 10 /hpf    Bacteria, UA Few None seen/Few  Urinalysis, Complete  Result Value Ref Range    Specific Gravity, UA 1.020 1.005 - 1.030    pH, UA 5.5 5.0 - 7.5    Color, UA Yellow Yellow    Appearance Ur Hazy (A) Clear    Leukocytes,UA Trace (A) Negative    Protein,UA Negative Negative/Trace    Glucose, UA Negative Negative    Ketones, UA Negative Negative    RBC, UA 3+ (A) Negative    Bilirubin, UA Negative Negative    Urobilinogen, Ur 0.2 0.2 - 1.0 mg/dL    Nitrite, UA Negative Negative    Microscopic Examination See below:        Pertinent Imaging: RUS pending tomorrow     Assessment & Plan:     1. Right Staghorn calculus Now that her left side has been addressed, we will plan to move forward with her right-sided full staghorn calculus.  I recommended right PCNL, this time with multiple access sites in order to try to reduce the number of procedures.  Her infundibular anatomy on this side seems to more favorable.  We reviewed the risk including risk of bleeding, infection, damage to structures, possible need for further procedures, ureteral stent, and blood transfusion amongst others.  All questions answered  Preoperative UA urine culture.  We will follow-up with her renal ultrasound was scheduled for tomorrow to ensure that her left-sided kidney is not obstructed and healthy prior to moving forward with her right. - Urinalysis, Complete - CULTURE, URINE COMPREHENSIVE   2. Acute kidney injury ([place]) Bump in creatinine in early June up to 1.6, now trending back down to 1.2 which is reassuring  Follow-up renal ultrasound     Hollice Espy, MD   [facility] [address] [place], [place] [address] [phone]

## 2021-04-20 NOTE — H&P (Signed)
Chief Complaint: Patient was seen in consultation today for right sided staghorn calculi at the request of [place]  Referring Physician(s): Hollice Espy  Supervising Physician: Arne Cleveland  Patient Status: [facility] - Out-pt  History of Present Illness: Gabrielle Barnes is a 70 y.o. female with bilateral staghorn calculi and chronic UTIs, she is s/p treatment of left side with PCNL and ureteroscopy x2. Today she is scheduled electively for Right PCN with PCNL to follow in OR. The patient has had a H&P performed within the last 30 days, all history, medications, and exam have been reviewed. The patient denies any interval changes since the H&P.  She denies any current abdominal pain, right sided flank pain, chest pain, shortness of breath or palpitations. She denies any recent fever or chills. The patient denies any history of sleep apnea or chronic oxygen use. She has previously tolerated sedation without complications.    Past Medical History:  Diagnosis Date   COVID-19 06/2020   Depression    after husbands death   GERD (gastroesophageal reflux disease)    History of chicken pox    History of kidney stones    History of measles    Hypertension    Migraine     Past Surgical History:  Procedure Laterality Date   ABDOMINAL HYSTERECTOMY     APPENDECTOMY     CESAREAN SECTION     x2   colonoscopy     CYSTOSCOPY WITH HOLMIUM LASER LITHOTRIPSY  08/18/2020   Procedure: HOLMIUM LASER LITHOTRIPSY;  Surgeon: Hollice Espy, MD;  Location: [facility];  Service: Urology;;   CYSTOSCOPY/URETEROSCOPY/HOLMIUM LASER/STENT PLACEMENT Left 09/15/2020   Procedure: CYSTOSCOPY/URETEROSCOPY/HOLMIUM LASER/STENT Exchange;  Surgeon: Hollice Espy, MD;  Location: [facility];  Service: Urology;  Laterality: Left;   CYSTOSCOPY/URETEROSCOPY/HOLMIUM LASER/STENT PLACEMENT Left 10/16/2020   Procedure: CYSTOSCOPY/URETEROSCOPY/HOLMIUM LASER/STENT Exchange;  Surgeon: Hollice Espy, MD;  Location:  [facility];  Service: Urology;  Laterality: Left;   IR NEPHROSTOMY PLACEMENT LEFT  08/18/2020   NEPHROLITHOTOMY Left 08/18/2020   Procedure: NEPHROLITHOTOMY PERCUTANEOUS;  Surgeon: Hollice Espy, MD;  Location: [facility];  Service: Urology;  Laterality: Left;  possibly appropriate for [place] Right 06/13/2020   Procedure: XI ROBOTIC LAPAROSCOPIC ASSISTED APPENDECTOMY, RIGHT COLECTOMY;  Surgeon: Ronny Bacon, MD;  Location: [facility];  Service: General;  Laterality: Right;    Allergies: Keflex [cephalexin]  Medications: Prior to Admission medications   Medication Sig Start Date End Date Taking? Authorizing Provider  acetaminophen (TYLENOL) 500 MG tablet Take 1,000 mg by mouth every 6 (six) hours as needed (for pain.).    [provider]  alendronate (FOSAMAX) 70 MG tablet Take 1 tablet (70 mg total) by mouth once a week. Take with a full glass of water on an empty stomach. Patient taking differently: Take 70 mg by mouth every Saturday. Take with a full glass of water on an empty stomach. 07/03/19   Birdie Sons, MD  amLODipine (NORVASC) 5 MG tablet Take 5 mg by mouth every evening. 01/21/21   [provider]  Calcium Carb-Cholecalciferol (CALCIUM 600/VITAMIN D3 PO) Take 1 tablet by mouth in the morning and at bedtime.    [provider]  dorzolamide-timolol (COSOPT) 22.3-6.8 MG/ML ophthalmic solution Place 1 drop into both eyes 2 (two) times daily.  01/18/18   [provider]  famotidine (PEPCID) 20 MG tablet TAKE 1 TABLET BY MOUTH TWICE A DAY 12/17/19   Birdie Sons, MD  lovastatin (MEVACOR) 40 MG tablet  Take 80 mg by mouth at bedtime. 09/09/20   [provider]  traZODone (DESYREL) 100 MG tablet TAKE 1 TABLET BY MOUTH EVERY NIGHT AT BEDTIME 06/23/20   Birdie Sons, MD  vitamin B-12 (CYANOCOBALAMIN) 500 MCG tablet Take 500 mcg by mouth in the morning.    [provider]     Family  History  Problem Relation Age of Onset   Dementia Mother    Stroke Father    Hypertension Father    Cancer Neg Hx    Diabetes Neg Hx     Social History   Socioeconomic History   Marital status: Widowed    Spouse name: Not on file   Number of children: 2   Years of education: Not on file   Highest education level: Not on file  Occupational History   Not on file  Tobacco Use   Smoking status: Never   Smokeless tobacco: Never  Vaping Use   Vaping Use: Never used  Substance and Sexual Activity   Alcohol use: No    Alcohol/week: 0.0 standard drinks   Drug use: No   Sexual activity: Not on file  Other Topics Concern   Not on file  Social History Narrative   Daughter will be the one to be in contact with after surgery.   Social Determinants of Health   Financial Resource Strain: Not on file  Food Insecurity: Not on file  Transportation Needs: Not on file  Physical Activity: Not on file  Stress: Not on file  Social Connections: Not on file   Review of Systems: A 12 point ROS discussed and pertinent positives are indicated in the HPI above.  All other systems are negative.  Review of Systems  Vital Signs: BP (!) 159/80 (BP Location: Right Arm)   Pulse 73   Resp 16   SpO2 100%   Physical Exam Constitutional:      Appearance: Normal appearance.  HENT:     Head: Normocephalic and atraumatic.  Cardiovascular:     Rate and Rhythm: Normal rate and regular rhythm.  Pulmonary:     Effort: Pulmonary effort is normal. No respiratory distress.     Breath sounds: Normal breath sounds.  Abdominal:     Palpations: Abdomen is soft.  Skin:    General: Skin is warm and dry.  Neurological:     Mental Status: She is alert and oriented to person, place, and time.  Psychiatric:        Thought Content: Thought content normal.    Imaging: No results found.  Labs:  CBC: Recent Labs    06/15/20 0430 08/07/20 1146 08/19/20 0647 08/19/20 1310 04/20/21 0703  WBC 11.0*  9.8 22.7*  --  7.6  HGB 11.3* 13.2 9.1* 9.1* 14.1  HCT 35.2* 40.4 28.7* 27.7* 42.1  PLT 241 335 227  --  307    COAGS: Recent Labs    08/07/20 1146 04/20/21 0703  INR 0.9 0.9  APTT 33  --     BMP: Recent Labs    06/15/20 0430 08/07/20 1146 08/19/20 0647 04/20/21 0703  NA 138 137 136 139  K 3.8 3.5 4.6 4.2  CL 109 97* 104 103  CO2 '25 29 23 27  '$ GLUCOSE 91 114* 133* 106*  BUN '8 17 14 '$ 24*  CALCIUM 8.1* 10.0 7.0* 9.3  CREATININE 0.83 1.08* 1.07* 1.09*  GFRNONAA >60 56* 56* 55*    LIVER FUNCTION TESTS: Recent Labs    06/03/20 1012  BILITOT 0.5  AST 20  ALT 17  ALKPHOS 51  PROT 7.9  ALBUMIN 4.1    Assessment and Plan: 70 year old female with bilateral staghorn calculi and chronic UTIs S/p treatment of left side with PCNL and ureteroscopy x2. Scheduled today for Right PCN with PCNL to follow in OR The patient has been NPO, no blood thinners taken, imaging labs and vitals have been reviewed.   Risks and benefits of right PCN placement was discussed with the patient including, but not limited to, infection, bleeding, significant bleeding causing loss or decrease in renal function or damage to adjacent structures.   All of the patient's questions were answered, patient is agreeable to proceed.  Consent signed and in chart.   Thank you for this interesting consult.  I greatly enjoyed meeting JOVONA NEAL and look forward to participating in their care.  A copy of this report was sent to the requesting provider on this date.  Electronically Signed: Hedy Jacob, PA-C 04/20/2021, 8:27 AM   I spent a total of 15 Minutes in face to face in clinical consultation, greater than 50% of which was counseling/coordinating care for right staghorn calculi.

## 2021-04-20 NOTE — Anesthesia Preprocedure Evaluation (Signed)
Anesthesia Evaluation  Patient identified by MRN, date of birth, ID band Patient awake    Reviewed: Allergy & Precautions, NPO status , Patient's Chart, lab work & pertinent test results  History of Anesthesia Complications Negative for: history of anesthetic complications  Airway Mallampati: II       Dental  (+) Dental Advidsory Given, Poor Dentition   Pulmonary neg pulmonary ROS, neg shortness of breath, neg sleep apnea, neg COPD, neg recent URI, Not current smoker,           Cardiovascular hypertension, Pt. on medications (-) angina(-) Past MI and (-) CHF (-) dysrhythmias (-) Valvular Problems/Murmurs     Neuro/Psych neg Seizures PSYCHIATRIC DISORDERS Depression negative neurological ROS     GI/Hepatic Neg liver ROS, GERD  Medicated,  Endo/Other  negative endocrine ROSneg diabetes  Renal/GU Renal disease (stones)     Musculoskeletal   Abdominal   Peds  Hematology   Anesthesia Other Findings Past Medical History: 06/2020: COVID-19 No date: Depression     Comment:  after husbands death No date: GERD (gastroesophageal reflux disease) No date: History of chicken pox No date: History of kidney stones No date: History of measles No date: Hypertension No date: Migraine   Reproductive/Obstetrics                             Anesthesia Physical  Anesthesia Plan  ASA: 2  Anesthesia Plan: General   Post-op Pain Management:    Induction: Intravenous  PONV Risk Score and Plan: 3 and Ondansetron, Dexamethasone and Treatment may vary due to age or medical condition  Airway Management Planned: Oral ETT  Additional Equipment:   Intra-op Plan:   Post-operative Plan: Extubation in OR  Informed Consent: I have reviewed the patients History and Physical, chart, labs and discussed the procedure including the risks, benefits and alternatives for the proposed anesthesia with the patient or  authorized representative who has indicated his/her understanding and acceptance.     Dental advisory given  Plan Discussed with: CRNA and Surgeon  Anesthesia Plan Comments:         Anesthesia Quick Evaluation

## 2021-04-20 NOTE — Transfer of Care (Signed)
Immediate Anesthesia Transfer of Care Note  Patient: Gabrielle Barnes  Procedure(s) Performed: NEPHROLITHOTOMY PERCUTANEOUS (Right: Back)  Patient Location: PACU  Anesthesia Type:General  Level of Consciousness: drowsy  Airway & Oxygen Therapy: Patient Spontanous Breathing and Patient connected to face mask oxygen  Post-op Assessment: Report given to RN and Post -op Vital signs reviewed and stable  Post vital signs: Reviewed and stable  Last Vitals:  Vitals Value Taken Time  BP 125/69 04/20/21 1406  Temp 36.4 C 04/20/21 1406  Pulse 66 04/20/21 1410  Resp 19 04/20/21 1410  SpO2 96 % 04/20/21 1410  Vitals shown include unvalidated device data.  Last Pain:  Vitals:   04/20/21 0710  TempSrc: Tympanic  PainSc: 0-No pain      Patients Stated Pain Goal: 0 ([id number])  Complications: No notable events documented.

## 2021-04-20 NOTE — Procedures (Signed)
  Procedure: RUP and RLP antegrade nephroureteral catheters   EBL:   minimal Complications:  none immediate  See full dictation in [facility].  Dillard Cannon MD Main # [phone] Pager  [phone]

## 2021-04-20 NOTE — Anesthesia Procedure Notes (Signed)
Procedure Name: Intubation Date/Time: 04/20/2021 10:44 AM Performed by: Theresa Mulligan, RN Pre-anesthesia Checklist: Patient identified, Emergency Drugs available, Suction available and Patient being monitored Patient Re-evaluated:Patient Re-evaluated prior to induction Oxygen Delivery Method: Circle system utilized Preoxygenation: Pre-oxygenation with 100% oxygen Induction Type: IV induction Ventilation: Mask ventilation without difficulty and Oral airway inserted - appropriate to patient size Laryngoscope Size: McGraph, Mac and 3 Grade View: Grade I Tube type: Oral Tube size: 6.5 mm Number of attempts: 1 Airway Equipment and Method: Stylet and Oral airway Placement Confirmation: ETT inserted through vocal cords under direct vision, positive ETCO2 and breath sounds checked- equal and bilateral Secured at: 20 cm Tube secured with: Tape Dental Injury: Teeth and Oropharynx as per pre-operative assessment

## 2021-04-21 ENCOUNTER — Encounter: Payer: Self-pay | Admitting: Urology

## 2021-04-21 DIAGNOSIS — N200 Calculus of kidney: Secondary | ICD-10-CM | POA: Diagnosis not present

## 2021-04-21 LAB — BASIC METABOLIC PANEL
Anion gap: 8 (ref 5–15)
Anion gap: 8 (ref 5–15)
BUN: 23 mg/dL (ref 8–23)
BUN: 23 mg/dL (ref 8–23)
CO2: 20 mmol/L — ABNORMAL LOW (ref 22–32)
CO2: 20 mmol/L — ABNORMAL LOW (ref 22–32)
Calcium: 7.5 mg/dL — ABNORMAL LOW (ref 8.9–10.3)
Calcium: 7.5 mg/dL — ABNORMAL LOW (ref 8.9–10.3)
Chloride: 109 mmol/L (ref 98–111)
Chloride: 105 mmol/L (ref 98–111)
Creatinine, Ser: 1.28 mg/dL — ABNORMAL HIGH (ref 0.44–1.00)
Creatinine, Ser: 1.24 mg/dL — ABNORMAL HIGH (ref 0.44–1.00)
GFR, Estimated: 45 mL/min — ABNORMAL LOW (ref >60)
GFR, Estimated: 47 mL/min — ABNORMAL LOW (ref >60)
Glucose, Bld: 128 mg/dL — ABNORMAL HIGH (ref 70–99)
Glucose, Bld: 120 mg/dL — ABNORMAL HIGH (ref 70–99)
Potassium: 4.1 mmol/L (ref 3.5–5.1)
Potassium: 4.1 mmol/L (ref 3.5–5.1)
Sodium: 137 mmol/L (ref 135–145)
Sodium: 133 mmol/L — ABNORMAL LOW (ref 135–145)

## 2021-04-21 LAB — CBC
HCT: 31.4 % — ABNORMAL LOW (ref 36.0–46.0)
Hemoglobin: 10.2 g/dL — ABNORMAL LOW (ref 12.0–15.0)
MCH: 28.8 pg (ref 26.0–34.0)
MCHC: 32.5 g/dL (ref 30.0–36.0)
MCV: 88.7 fL (ref 80.0–100.0)
Platelets: 227 10*3/uL (ref 150–400)
RBC: 3.54 MIL/uL — ABNORMAL LOW (ref 3.87–5.11)
RDW: 13.5 % (ref 11.5–15.5)
WBC: 15.9 10*3/uL — ABNORMAL HIGH (ref 4.0–10.5)
nRBC: 0 % (ref 0.0–0.2)

## 2021-04-21 LAB — HEMOGLOBIN AND HEMATOCRIT, BLOOD
HCT: 29.7 % — ABNORMAL LOW (ref 36.0–46.0)
Hemoglobin: 9.6 g/dL — ABNORMAL LOW (ref 12.0–15.0)

## 2021-04-21 NOTE — Discharge Summary (Signed)
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 Briefly, Gabrielle Barnes is a 70 y.o. year old patient admitted on 04/20/2021 for scheduled right PCNL for management of a right staghorn calculus.  Physical Exam: Constitutional:  Alert and oriented, no acute distress, nontoxic appearing HEENT: [employer], AT Cardiovascular: No clubbing, cyanosis, or edema Respiratory: Normal respiratory effort, no increased work of breathing on nasal cannula GI: Abdomen is soft, nontender, non distended GU: Dressing clean and dry Skin: No rashes, bruises or suspicious lesions Neurologic: Grossly intact, no focal deficits, moving all 4 extremities Psychiatric: Normal mood and affect    Upper and lower nephrostomy tubes were removed and insertion sites were clean and dry.  The sites were covered with 4 x 4 guaze, ABD dressing and foam tape.   Hospital Course: Patient tolerated the procedure well.  She was then transferred to the floor after an uneventful PACU stay.  Her hospital course was uncomplicated.  On POD#2 she had met discharge criteria: was eating a regular diet, was up and ambulating independently,  pain was well controlled, was voiding without a  catheter, and was ready for discharge.  Laboratory values:  Recent Labs    04/20/21 0703 04/21/21 0416 04/21/21 1243 04/22/21 0437  WBC 7.6 15.9*  --  14.8*  HGB 14.1 10.2* 9.6* 9.6*  HCT 42.1 31.4* 29.7* 30.2*   Recent Labs    04/21/21 0416 04/21/21 1243 04/22/21 0437  NA 137 133* 135  K 4.1 4.1 4.1  CL 109 105 108  CO2 20* 20* 23  GLUCOSE 128* 120* 115*  BUN _0 CREATININE 1.28* 1.24* 1.30*  CALCIUM 7.5* 7.5* 8.1*   Recent Labs    04/20/21 0703  INR 0.9   Results for orders placed or performed during the hospital encounter of 04/17/21  SARS CORONAVIRUS 2 (TAT 6-24 HRS) Nasopharyngeal Nasopharyngeal Swab     Status: None   Collection Time: 04/17/21 11:54 AM   Specimen: Nasopharyngeal Swab  Result Value Ref Range Status   SARS Coronavirus 2 NEGATIVE NEGATIVE Final    Comment: (NOTE) SARS-CoV-2 target nucleic acids are NOT DETECTED.  The SARS-CoV-2 RNA is generally detectable in upper and lower respiratory specimens during the acute phase of infection. Negative results do not preclude SARS-CoV-2 infection, do not rule out co-infections with other pathogens, and should not be used as the sole basis for treatment or other patient management decisions. Negative results must be combined with clinical observations, patient history, and epidemiological information. The expected result is Negative.  Fact Sheet for Patients: SugarRoll.be  Fact Sheet for Healthcare Providers: https://www.woods-mathews.com/  This test is not yet approved or cleared by the [place] FDA and  has been authorized for detection and/or diagnosis of SARS-CoV-2 by FDA under an Emergency Use Authorization (EUA). This EUA  will remain  in effect (meaning this test can be used) for the duration of the COVID-19 declaration under Se ction 564(b)(1) of the Act, 21 U.S.C. section 360bbb-3(b)(1), unless the authorization is terminated or revoked  sooner.  Performed at [facility], [place] [address], [place], [place] [address]    Disposition: Home  Discharge instruction: The patient was instructed to be ambulatory but told to refrain from heavy lifting, strenuous activity, or driving. Stent symptoms reviewed. Instructed patient and daughter in right NT dressing changes.  Discharge medications:  Allergies as of 04/22/2021       Reactions   Keflex [cephalexin] Nausea Only        Medication List     TAKE these medications    acetaminophen 500 MG tablet Commonly known as: TYLENOL Take 1,000 mg by mouth every 6 (six) hours as needed (for pain.).   alendronate 70 MG tablet Commonly known as: FOSAMAX Take 1 tablet (70 mg total) by mouth once a week. Take with a full glass of water on an empty stomach. What changed: when to take this   amLODipine 5 MG tablet Commonly known as: NORVASC Take 5 mg by mouth every evening.   CALCIUM 600/VITAMIN D3 PO Take 1 tablet by mouth in the morning and at bedtime.   dorzolamide-timolol 22.3-6.8 MG/ML ophthalmic solution Commonly known as: COSOPT Place 1 drop into both eyes 2 (two) times daily.   famotidine 20 MG tablet Commonly known as: PEPCID TAKE 1 TABLET BY MOUTH TWICE A DAY   lovastatin 40 MG tablet Commonly known as: MEVACOR Take 80 mg by mouth at bedtime.   oxybutynin 5 MG tablet Commonly known as: DITROPAN Take 1 tablet (5 mg total) by mouth every 8 (eight) hours as needed for bladder spasms.   oxyCODONE-acetaminophen 5-325 MG tablet Commonly known as: PERCOCET/ROXICET Take 1-2 tablets by mouth every 4 (four) hours as needed for moderate pain.   traZODone 100 MG tablet Commonly known as: DESYREL TAKE 1 TABLET BY MOUTH EVERY NIGHT AT BEDTIME   vitamin B-12 500 MCG tablet Commonly known as: CYANOCOBALAMIN Take 500 mcg by mouth in the morning.         Followup:    Patient will be scheduled for right ureteroscopy with right laser lithotripsy with right  ureteral stent exchange in 6 weeks.  Booking orders are in place and our surgery coordinator will be contacting her.

## 2021-04-21 NOTE — Progress Notes (Addendum)
Patient tube from right nephrostomy has drained bloody urine through the night. Foley has had little output but blood tinged color.  Dr. Erlene Quan notified of drop in hgb today down to 10.2 from 14.1. No new orders, will continue to monitor.

## 2021-04-21 NOTE — Progress Notes (Signed)
Neprho tube has been clamped by Aldona Bar PA and foley cath also removed by [facility] PA  Pt ambulated to bathroom w/assist from [facility] and voided urine.   Pt is back in bed resting

## 2021-04-21 NOTE — Progress Notes (Addendum)
Urology Inpatient Progress Note  Subjective: No acute events overnight.  VSS and satting above 92% on 2 L nasal cannula. Hemoglobin down today, 10.2.  WBC count up today, 15.9.  Creatinine up today, 1.28.  Morning labs also reveal hypocalcemia, 7.5, and hypocapnia, 20. Foley catheter in place draining clear, pink urine.  1 nephrostomy tube is plugged, there nephrostomy tube is also draining clear, pink urine. Patient reports some general stiffness today and discomfort with movement but has no acute concerns.  Anti-infectives: Anti-infectives (From admission, onward)    Start     Dose/Rate Route Frequency Ordered Stop   04/20/21 1800  ceFAZolin (ANCEF) IVPB 1 g/50 mL premix        1 g 100 mL/hr over 30 Minutes Intravenous Every 8 hours 04/20/21 1646 04/21/21 0228   04/20/21 0730  levofloxacin (LEVAQUIN) 500 MG/100ML IVPB       Note to Pharmacy: Sylvester Harder   : cabinet override      04/20/21 0730 04/20/21 1944   04/20/21 0730  ceFAZolin (ANCEF) 2-4 GM/100ML-% IVPB       Note to Pharmacy: Sylvester Harder   : cabinet override      04/20/21 0730 04/20/21 1119   04/20/21 0615  levofloxacin (LEVAQUIN) IVPB 500 mg  Status:  Discontinued        500 mg 100 mL/hr over 60 Minutes Intravenous To Radiology 04/20/21 0610 04/20/21 1600   04/20/21 0047  ceFAZolin (ANCEF) IVPB 2g/100 mL premix        2 g 200 mL/hr over 30 Minutes Intravenous 30 min pre-op 04/20/21 0047 04/20/21 1050       Current Facility-Administered Medications  Medication Dose Route Frequency Provider Last Rate Last Admin   acetaminophen (TYLENOL) tablet 650 mg  650 mg Oral Q4H PRN Hollice Espy, MD       amLODipine (NORVASC) tablet 5 mg  5 mg Oral QPM Hollice Espy, MD       belladonna-opium (B&O) suppository 16.2-'60mg'$   1 suppository Rectal Q6H PRN Hollice Espy, MD       Chlorhexidine Gluconate Cloth 2 % PADS 6 each  6 each Topical Daily Hollice Espy, MD   6 each at 04/21/21 1118   diphenhydrAMINE (BENADRYL) injection  12.5 mg  12.5 mg Intravenous Q6H PRN Hollice Espy, MD       Or   diphenhydrAMINE (BENADRYL) 12.5 MG/5ML elixir 12.5 mg  12.5 mg Oral Q6H PRN Hollice Espy, MD       docusate sodium (COLACE) capsule 100 mg  100 mg Oral BID Hollice Espy, MD   100 mg at 04/21/21 1117   dorzolamide-timolol (COSOPT) 22.3-6.8 MG/ML ophthalmic solution 1 drop  1 drop Both Eyes BID Hollice Espy, MD   1 drop at 04/21/21 1117   famotidine (PEPCID) tablet 20 mg  20 mg Oral BID Hollice Espy, MD   20 mg at 04/21/21 1117   morphine 2 MG/ML injection 2-4 mg  2-4 mg Intravenous Q2H PRN Hollice Espy, MD   2 mg at 04/20/21 2241   ondansetron (ZOFRAN) injection 4 mg  4 mg Intravenous Q4H PRN Hollice Espy, MD   4 mg at 04/20/21 1951   oxybutynin (DITROPAN) tablet 5 mg  5 mg Oral Q8H PRN Hollice Espy, MD       oxyCODONE-acetaminophen (PERCOCET/ROXICET) 5-325 MG per tablet 1-2 tablet  1-2 tablet Oral Q4H PRN Hollice Espy, MD   2 tablet at 04/21/21 1117   pravastatin (PRAVACHOL) tablet 40 mg  40 mg Oral q1800 Erlene Quan,  Caryl Pina, MD   40 mg at 04/20/21 1839   sodium chloride flush (NS) 0.9 % injection 5 mL  5 mL Intracatheter Q8H Arne Cleveland, MD   5 mL at 04/21/21 0609   traZODone (DESYREL) tablet 100 mg  100 mg Oral QHS Hollice Espy, MD   100 mg at 04/20/21 2219   Objective: Vital signs in last 24 hours: Temp:  [97.5 F (36.4 C)-98.7 F (37.1 C)] 98.5 F (36.9 C) (09/13 0812) Pulse Rate:  [60-85] 85 (09/13 0812) Resp:  [11-18] 18 (09/13 0812) BP: (116-154)/(53-80) 136/63 (09/13 0812) SpO2:  [94 %-99 %] 94 % (09/13 0812)  Intake/Output from previous day: 09/12 0701 - 09/13 0700 In: 1428.3 [P.O.:120; I.V.:1208.3; IV Piggyback:100] Out: 1250 [Urine:1200; Blood:50] Intake/Output this shift: Total I/O In: 981 [P.O.:240; I.V.:741] Out: 400 [Urine:400]  Physical Exam Vitals and nursing note reviewed.  Constitutional:      General: She is not in acute distress.    Appearance: She is not  ill-appearing, toxic-appearing or diaphoretic.  HENT:     Head: Normocephalic and atraumatic.  Pulmonary:     Effort: Pulmonary effort is normal. No respiratory distress.  Skin:    General: Skin is warm and dry.  Neurological:     Mental Status: She is alert and oriented to person, place, and time.  Psychiatric:        Mood and Affect: Mood normal.        Behavior: Behavior normal.    Lab Results:  Recent Labs    04/20/21 0703 04/21/21 0416 04/21/21 1243  WBC 7.6 15.9*  --   HGB 14.1 10.2* 9.6*  HCT 42.1 31.4* 29.7*  PLT 307 227  --    BMET Recent Labs    04/21/21 0416 04/21/21 1243  NA 137 133*  K 4.1 4.1  CL 109 105  CO2 20* 20*  GLUCOSE 128* 120*  BUN 23 23  CREATININE 1.28* 1.24*  CALCIUM 7.5* 7.5*   PT/INR Recent Labs    04/20/21 0703  LABPROT 11.9  INR 0.9   Assessment & Plan: 70 year old female POD 1 from a scheduled right PCNL with Dr. Erlene Quan for management of a right staghorn stone.  I removed her Foley catheter and plugged her draining nephrostomy tube this morning for clamping trial.  IV fluids were discontinued.  She was able to spontaneously void after Foley removal.  Midday labs revealed new hyponatremia, 133, stable hypocapnia, stable creatinine, and stable hypocalcemia.  Her hemoglobin declined slightly to 9.6, though there is no evidence of acute abdomen or clinically significant hematuria.  Based on these findings, we will plan to keep her overnight and will recheck labs in the morning as well as stop heparin.  Foley catheter to remain out and will keep her nephrostomy tubes clamped.  We will plan to wean O2 overnight as well.  We discussed discharge goals today including ambulation and tolerating p.o. intake.  Patient expressed understanding.  Debroah Loop, PA-C 04/21/2021

## 2021-04-21 NOTE — Anesthesia Postprocedure Evaluation (Signed)
Anesthesia Post Note  Patient: Gabrielle Barnes  Procedure(s) Performed: NEPHROLITHOTOMY PERCUTANEOUS (Right: Back)  Anesthesia Type: General Anesthetic complications: no   No notable events documented.   Last Vitals:  Vitals:   04/20/21 1615 04/20/21 1700  BP: 129/66 (!) 152/76  Pulse: 66 68  Resp: 13 16  Temp: 36.6 C   SpO2: 99% 98%    Last Pain:  Vitals:   04/20/21 2311  TempSrc:   PainSc: Asleep                 [place] Ouch

## 2021-04-22 ENCOUNTER — Telehealth: Payer: Self-pay | Admitting: Urology

## 2021-04-22 DIAGNOSIS — N200 Calculus of kidney: Secondary | ICD-10-CM | POA: Diagnosis not present

## 2021-04-22 LAB — BASIC METABOLIC PANEL
Anion gap: 4 — ABNORMAL LOW (ref 5–15)
BUN: 19 mg/dL (ref 8–23)
CO2: 23 mmol/L (ref 22–32)
Calcium: 8.1 mg/dL — ABNORMAL LOW (ref 8.9–10.3)
Chloride: 108 mmol/L (ref 98–111)
Creatinine, Ser: 1.3 mg/dL — ABNORMAL HIGH (ref 0.44–1.00)
GFR, Estimated: 44 mL/min — ABNORMAL LOW (ref >60)
Glucose, Bld: 115 mg/dL — ABNORMAL HIGH (ref 70–99)
Potassium: 4.1 mmol/L (ref 3.5–5.1)
Sodium: 135 mmol/L (ref 135–145)

## 2021-04-22 LAB — CBC
HCT: 30.2 % — ABNORMAL LOW (ref 36.0–46.0)
Hemoglobin: 9.6 g/dL — ABNORMAL LOW (ref 12.0–15.0)
MCH: 28.2 pg (ref 26.0–34.0)
MCHC: 31.8 g/dL (ref 30.0–36.0)
MCV: 88.6 fL (ref 80.0–100.0)
Platelets: 203 10*3/uL (ref 150–400)
RBC: 3.41 MIL/uL — ABNORMAL LOW (ref 3.87–5.11)
RDW: 13.8 % (ref 11.5–15.5)
WBC: 14.8 10*3/uL — ABNORMAL HIGH (ref 4.0–10.5)
nRBC: 0 % (ref 0.0–0.2)

## 2021-04-22 MED ORDER — OXYBUTYNIN CHLORIDE 5 MG PO TABS: 5.0000 mg | ORAL_TABLET | Freq: Three times a day (TID) | ORAL | 0 refills | Status: DC | PRN

## 2021-04-22 MED ORDER — OXYCODONE-ACETAMINOPHEN 5-325 MG PO TABS: 1.0000 | ORAL_TABLET | ORAL | 0 refills | Status: DC | PRN

## 2021-04-22 NOTE — Plan of Care (Signed)
  Problem: Education: Goal: Knowledge of General Education information will improve Description: Including pain rating scale, medication(s)/side effects and non-pharmacologic comfort measures Outcome: Completed/Met   Problem: Clinical Measurements: Goal: Ability to maintain clinical measurements within normal limits will improve Outcome: Completed/Met Goal: Will remain free from infection Outcome: Completed/Met Goal: Diagnostic test results will improve Outcome: Completed/Met Goal: Respiratory complications will improve Outcome: Completed/Met Goal: Cardiovascular complication will be avoided Outcome: Completed/Met   Problem: Activity: Goal: Risk for activity intolerance will decrease Outcome: Completed/Met   Problem: Nutrition: Goal: Adequate nutrition will be maintained Outcome: Completed/Met   Problem: Elimination: Goal: Will not experience complications related to bowel motility Outcome: Completed/Met   Problem: Pain Managment: Goal: General experience of comfort will improve Outcome: Completed/Met   

## 2021-04-22 NOTE — Progress Notes (Signed)
Pt discharged home with daughter in stable condition. Discharge instructions given. Pt and daughter verbalized understanding. Scripts sent to pharmacy of choice. No immediate questions or concerns at this time.

## 2021-04-22 NOTE — Telephone Encounter (Signed)
Spoke with her daughter, Sharyn Lull, and discussed wound care and dressing changes.  I also informed her that Denny Peon will call her from our office to set up her ureteroscopy in 6 weeks.

## 2021-04-23 ENCOUNTER — Other Ambulatory Visit: Payer: Self-pay | Admitting: Urology

## 2021-04-23 ENCOUNTER — Encounter: Payer: Self-pay | Admitting: Urology

## 2021-04-23 ENCOUNTER — Other Ambulatory Visit: Payer: Self-pay | Admitting: Family Medicine

## 2021-04-23 MED ORDER — OXYBUTYNIN CHLORIDE 5 MG PO TABS: 5.0000 mg | ORAL_TABLET | Freq: Three times a day (TID) | ORAL | 0 refills | Status: DC | PRN

## 2021-04-25 LAB — CALCULI, WITH PHOTOGRAPH (CLINICAL LAB)
Carbonate Apatite: 40 %
Mg NH4 PO4 (Struvite): 60 %
Weight Calculi: 7745 mg

## 2021-04-27 ENCOUNTER — Telehealth: Payer: Self-pay

## 2021-04-27 MED ORDER — NYSTATIN 100000 UNIT/ML MT SUSP: 5.0000 mL | Freq: Four times a day (QID) | OROMUCOSAL | 1 refills | Status: DC

## 2021-04-27 NOTE — Telephone Encounter (Signed)
Patient advised, samples left upfront. Voiced understanding.

## 2021-04-27 NOTE — Telephone Encounter (Signed)
Patient left message on triage line stating the bladder spasm medications are not helping and now she has thrush on her tongue. Please advise.

## 2021-04-27 NOTE — Telephone Encounter (Signed)
Patient's daughter called stating that patient is experiencing very bad bladder spasms. She has been utilizing the oxybutinin and oxycodone to help with her pain with out much relief. She also states that patient is very constipated she has not had a bowel movement since surgery last Monday. Patient has tried colace and miralax with no success. Instructions were given to try a laxative such as dulcolax since the stool softeners were not helping and then try a fleets enema if necessary. Patient should also increase water intake and stop oxybutinin and switch to OTC pain medication if possible to help with constipation. It was explained that both oxycodone and oxybutinin have constipation as a side effect. The constipation may also be causing the increase of discomfort with bladder spasms. Patient denies fever, chills nausea, vomiting and no other urinary symptoms. Ok to switch patient to Myrbetriq samples to help with bladder spasm discomfort?

## 2021-04-27 NOTE — Telephone Encounter (Signed)
Yes, okay for her to pick up Myrbetriq samples 50 mg x 1 month as her bladder spasms are likely coming from her stent.  Also, she mentioned that she has thrush again.  I am sending her nystatin swish to the pharmacy as well, please let her know.  Hollice Espy, MD

## 2021-04-28 ENCOUNTER — Telehealth: Payer: Self-pay | Admitting: Urology

## 2021-04-28 NOTE — Telephone Encounter (Signed)
Per Dr. Erlene Quan Patient is to be scheduled for Right Ureteroscopy, Laser Lithotripsy,Ureteral Stent Exchange  [name] was contacted and possible surgical dates were discussed, 06/08/21 was agreed upon for surgery. Patient was instructed that Dr. Erlene Quan will require them to provide a pre-op UA & CX prior to surgery. This was ordered and scheduled drop off appointment was made for 05/25/21 @ 10:00.   Patient was directed to call [phone] between 1-3pm the day before surgery to find out surgical arrival time.  Instructions were given not to eat or drink from midnight on the night before surgery and have a driver for the day of surgery. On the surgery day patient was instructed to enter through the [place] entrance of [facility] report the Same Day Surgery desk.   Pre-Admit Testing will be in contact via phone to set up an interview with the anesthesia team to review your history and medications prior to surgery.   Reminder of this information was sent via mychart to the patient.   Patient is to hold anticoag's per Dr. Erlene Quan.

## 2021-04-29 ENCOUNTER — Other Ambulatory Visit: Payer: Self-pay

## 2021-04-29 DIAGNOSIS — N200 Calculus of kidney: Secondary | ICD-10-CM

## 2021-04-29 NOTE — Progress Notes (Signed)
[facility] Posting Form   Surgery Date/Time: Date: 05/25/2021  Surgeon: Dr. Hollice Espy, MD  Surgery Location: Day Surgery  Inpt ( No  )   Outpt (Yes)   Obs ( No  )   Diagnosis: Right Kidney Stone N20.0  -CPT: [phone]  Surgery: Right Ureteroscopy, laser lithotripsy and stent exchange  Stop Anticoagulations: Yes  Cardiac/Medical/Pulmonary Clearance needed: No Clearance needed  *Orders entered into EPIC  Date: 04/29/2021    *Case booked in [place]  Date: 04/29/21  *Notified pt of Surgery: Date: 04/29/21  PRE-OP UA & CX: Yes   *Placed into Prior Authorization Work Que Date: 04/29/21   Assistant/laser/rep:No

## 2021-04-29 NOTE — Progress Notes (Signed)
Surgical Physician Order Form  ** Scheduling expectation : 6 weeks from PCNL  *Length of Case:  Normal  *Clearance needed: no  *Anticoagulation Instructions: Hold all anticoagulants  *Aspirin Instructions: N/A  *Post-op visit Date/Instructions:   TBD  *Diagnosis:  Right Kidney Stones  *Procedure: Ureteroscopy w/laser lithotripsy & stent placement/exchange (10272)  -Admit type: OUTpatient  -Anesthesia: General  -VTE Prophylaxis Standing Order SCD's       Other:   -Standing Lab Orders Per Anesthesia    Lab other: Urinalysis and Urine Culture  -Standing Test orders EKG/Chest x-ray per Anesthesia       Test other:   - Medications:     Ancef 2gm IV   Other Instructions:

## 2021-04-29 NOTE — Telephone Encounter (Signed)
Orders placed for surgery and sent to Pre-admit.

## 2021-05-25 ENCOUNTER — Other Ambulatory Visit: Payer: Self-pay

## 2021-05-25 ENCOUNTER — Other Ambulatory Visit: Payer: PPO

## 2021-05-25 ENCOUNTER — Telehealth: Payer: Self-pay | Admitting: Urology

## 2021-05-25 DIAGNOSIS — N200 Calculus of kidney: Secondary | ICD-10-CM | POA: Diagnosis not present

## 2021-05-25 LAB — URINALYSIS, COMPLETE
Bilirubin, UA: NEGATIVE
Glucose, UA: NEGATIVE
Ketones, UA: NEGATIVE
Nitrite, UA: NEGATIVE
Specific Gravity, UA: 1.015 (ref 1.005–1.030)
Urobilinogen, Ur: 0.2 mg/dL (ref 0.2–1.0)
pH, UA: 7 (ref 5.0–7.5)

## 2021-05-25 LAB — MICROSCOPIC EXAMINATION
Epithelial Cells (non renal): >10 /hpf — ABNORMAL HIGH (ref 0–10)
RBC, Urine: >30 /hpf — ABNORMAL HIGH (ref 0–2)
WBC, UA: >30 /hpf — ABNORMAL HIGH (ref 0–5)

## 2021-05-25 NOTE — Telephone Encounter (Signed)
Patient came if for labs, but would like to know if she could get either a refill or more samples of the Myrbetriq - I know I spelled that all wrong.

## 2021-05-25 NOTE — Telephone Encounter (Addendum)
Patient given one month worth of samples

## 2021-05-28 LAB — CULTURE, URINE COMPREHENSIVE

## 2021-05-29 ENCOUNTER — Other Ambulatory Visit: Payer: Self-pay

## 2021-05-29 ENCOUNTER — Encounter
Admission: RE | Admit: 2021-05-29 | Discharge: 2021-05-29 | Disposition: A | Discharge status: Home / Self Care | Payer: PPO | Source: Ambulatory Visit | Attending: Urology | Admitting: Urology

## 2021-05-29 NOTE — Patient Instructions (Addendum)
Your procedure is scheduled on: Monday 06/01/21 Report to the Registration Desk on the 1st floor of the [facility]. To find out your arrival time, please call [phone] between 1PM - 3PM on: Friday 05/29/21  REMEMBER: Instructions that are not followed completely may result in serious medical risk, up to and including death; or upon the discretion of your surgeon and anesthesiologist your surgery may need to be rescheduled.  Do not eat food or drink after midnight the night before surgery.  No gum chewing, lozengers or hard candies.  TAKE THESE MEDICATIONS THE MORNING OF SURGERY WITH A SIP OF WATER famotidine (PEPCID) 20 MG tablet (take one the night before and one on the morning of surgery - helps to prevent nausea after surgery.)  One week prior to surgery: Stop as of today Stop Anti-inflammatories (NSAIDS) such as Advil, Aleve, Ibuprofen, Motrin, Naproxen, Naprosyn and Aspirin based products such as Excedrin, Goodys Powder, BC Powder. Stop ANY OVER THE COUNTER supplements until after surgery such as Calcium Carb-Cholecalciferol (CALCIUM 600/VITAMIN D3 PO), vitamin B-12 (CYANOCOBALAMIN) 500 MCG tablet.  You may however, continue to take Tylenol if needed for pain up until the day of surgery.  No Alcohol for 24 hours before or after surgery.  No Smoking including e-cigarettes for 24 hours prior to surgery.  No chewable tobacco products for at least 6 hours prior to surgery.  No nicotine patches on the day of surgery.  Do not use any "recreational" drugs for at least a week prior to your surgery.  Please be advised that the combination of cocaine and anesthesia may have negative outcomes, up to and including death. If you test positive for cocaine, your surgery will be cancelled.  On the morning of surgery brush your teeth with toothpaste and water, you may rinse your mouth with mouthwash if you wish. Do not swallow any toothpaste or mouthwash.  Do not wear jewelry, make-up,  hairpins, clips or nail polish.  Do not wear lotions, powders, or perfumes.   Do not shave body from the neck down 48 hours prior to surgery just in case you cut yourself which could leave a site for infection.   dentures may not be worn into surgery.  Do not bring valuables to the hospital. [facility] is not responsible for any missing/lost belongings or valuables.   Notify your doctor if there is any change in your medical condition (cold, fever, infection).  Wear comfortable clothing (specific to your surgery type) to the hospital.  After surgery, you can help prevent lung complications by doing breathing exercises.  Take deep breaths and cough every 1-2 hours.  If you are being discharged the day of surgery, you will not be allowed to drive home. You will need a responsible adult (18 years or older) to drive you home and stay with you that night.   If you are taking public transportation, you will need to have a responsible adult (18 years or older) with you. Please confirm with your physician that it is acceptable to use public transportation.   Please call the [facility] Dept. at [phone] if you have any questions about these instructions.  Surgery Visitation Policy:  Patients undergoing a surgery or procedure may have one family member or support person with them as long as that person is not COVID-19 positive or experiencing its symptoms.  That person may remain in the waiting area during the procedure and may rotate out with other people.  Inpatient Visitation:  Visiting hours are 7 a.m. to 8 p.m. Up to two visitors ages 16+ are allowed at one time in a patient room. The visitors may rotate out with other people during the day. Visitors must check out when they leave, or other visitors will not be allowed. One designated support person may remain overnight. The visitor must pass COVID-19 screenings, use hand sanitizer when entering and exiting the  patient's room and wear a mask at all times, including in the patient's room. Patients must also wear a mask when staff or their visitor are in the room. Masking is required regardless of vaccination status.

## 2021-06-01 ENCOUNTER — Inpatient Hospital Stay: Admission: RE | Admit: 2021-06-01 | Payer: PPO | Source: Ambulatory Visit

## 2021-06-01 ENCOUNTER — Other Ambulatory Visit: Payer: Self-pay

## 2021-06-01 ENCOUNTER — Ambulatory Visit: Payer: PPO

## 2021-06-01 ENCOUNTER — Encounter
Admission: RE | Disposition: A | Discharge status: Home / Self Care | Payer: Self-pay | Source: Ambulatory Visit | Attending: Urology

## 2021-06-01 ENCOUNTER — Encounter: Payer: Self-pay | Admitting: Urology

## 2021-06-01 ENCOUNTER — Ambulatory Visit
Admission: RE | Admit: 2021-06-01 | Discharge: 2021-06-01 | Disposition: A | Discharge status: Home / Self Care | Payer: PPO | Source: Ambulatory Visit | Attending: Urology | Admitting: Urology

## 2021-06-01 DIAGNOSIS — Z8616 Personal history of COVID-19: Secondary | ICD-10-CM | POA: Diagnosis not present

## 2021-06-01 DIAGNOSIS — N200 Calculus of kidney: Secondary | ICD-10-CM | POA: Diagnosis not present

## 2021-06-01 DIAGNOSIS — N210 Calculus in bladder: Secondary | ICD-10-CM | POA: Diagnosis not present

## 2021-06-01 DIAGNOSIS — N201 Calculus of ureter: Secondary | ICD-10-CM | POA: Insufficient documentation

## 2021-06-01 HISTORY — PX: CYSTOSCOPY W/ RETROGRADES: SHX1426

## 2021-06-01 HISTORY — PX: CYSTOSCOPY/URETEROSCOPY/HOLMIUM LASER/STENT PLACEMENT: SHX6546

## 2021-06-01 SURGERY — CYSTOSCOPY/URETEROSCOPY/HOLMIUM LASER/STENT PLACEMENT
Anesthesia: General | Laterality: Right

## 2021-06-01 MED ORDER — FENTANYL CITRATE (PF) 100 MCG/2ML IJ SOLN
INTRAMUSCULAR | Status: AC
  Filled 2021-06-01: qty 2

## 2021-06-01 MED ORDER — ONDANSETRON HCL 4 MG/2ML IJ SOLN
INTRAMUSCULAR | Status: AC
  Filled 2021-06-01: qty 2

## 2021-06-01 MED ORDER — ACETAMINOPHEN 10 MG/ML IV SOLN: 1000.0000 mg | Freq: Once | INTRAVENOUS | Status: DC | PRN

## 2021-06-01 MED ORDER — DEXAMETHASONE SODIUM PHOSPHATE 10 MG/ML IJ SOLN
INTRAMUSCULAR | Status: DC | PRN
  Administered 2021-06-01: 10 mg via INTRAVENOUS

## 2021-06-01 MED ORDER — LIDOCAINE HCL (PF) 2 % IJ SOLN
INTRAMUSCULAR | Status: AC
  Filled 2021-06-01: qty 5

## 2021-06-01 MED ORDER — PROPOFOL 10 MG/ML IV BOLUS
INTRAVENOUS | Status: DC | PRN
  Administered 2021-06-01: 100 mg via INTRAVENOUS

## 2021-06-01 MED ORDER — CHLORHEXIDINE GLUCONATE 0.12 % MT SOLN
OROMUCOSAL | Status: AC
  Administered 2021-06-01: 15 mL via OROMUCOSAL
  Filled 2021-06-01: qty 15

## 2021-06-01 MED ORDER — ORAL CARE MOUTH RINSE: 15.0000 mL | Freq: Once | OROMUCOSAL | Status: AC

## 2021-06-01 MED ORDER — OXYCODONE HCL 5 MG/5ML PO SOLN: 5.0000 mg | Freq: Once | ORAL | Status: DC | PRN

## 2021-06-01 MED ORDER — PROPOFOL 10 MG/ML IV BOLUS
INTRAVENOUS | Status: AC
  Filled 2021-06-01: qty 20

## 2021-06-01 MED ORDER — PHENYLEPHRINE HCL (PRESSORS) 10 MG/ML IV SOLN
INTRAVENOUS | Status: DC | PRN
  Administered 2021-06-01 (×3): 40 ug via INTRAVENOUS
  Administered 2021-06-01: 80 ug via INTRAVENOUS
  Administered 2021-06-01: 40 ug via INTRAVENOUS

## 2021-06-01 MED ORDER — ROCURONIUM BROMIDE 100 MG/10ML IV SOLN
INTRAVENOUS | Status: DC | PRN
  Administered 2021-06-01: 40 mg via INTRAVENOUS

## 2021-06-01 MED ORDER — CEFAZOLIN SODIUM-DEXTROSE 2-4 GM/100ML-% IV SOLN
INTRAVENOUS | Status: AC
  Filled 2021-06-01: qty 100

## 2021-06-01 MED ORDER — CEFAZOLIN SODIUM-DEXTROSE 2-4 GM/100ML-% IV SOLN
2.0000 g | INTRAVENOUS | Status: AC
  Administered 2021-06-01: 2 g via INTRAVENOUS

## 2021-06-01 MED ORDER — SUGAMMADEX SODIUM 200 MG/2ML IV SOLN
INTRAVENOUS | Status: DC | PRN
  Administered 2021-06-01: 200 mg via INTRAVENOUS

## 2021-06-01 MED ORDER — DEXAMETHASONE SODIUM PHOSPHATE 10 MG/ML IJ SOLN
INTRAMUSCULAR | Status: AC
  Filled 2021-06-01: qty 1

## 2021-06-01 MED ORDER — LACTATED RINGERS IV SOLN: INTRAVENOUS | Status: DC

## 2021-06-01 MED ORDER — TAMSULOSIN HCL 0.4 MG PO CAPS: 0.4000 mg | ORAL_CAPSULE | Freq: Every day | ORAL | 0 refills | Status: DC

## 2021-06-01 MED ORDER — ACETAMINOPHEN 10 MG/ML IV SOLN
INTRAVENOUS | Status: DC | PRN
  Administered 2021-06-01: 1000 mg via INTRAVENOUS

## 2021-06-01 MED ORDER — OXYCODONE-ACETAMINOPHEN 5-325 MG PO TABS: 1.0000 | ORAL_TABLET | ORAL | 0 refills | Status: DC | PRN

## 2021-06-01 MED ORDER — LIDOCAINE HCL (CARDIAC) PF 100 MG/5ML IV SOSY
PREFILLED_SYRINGE | INTRAVENOUS | Status: DC | PRN
  Administered 2021-06-01: 60 mg via INTRAVENOUS

## 2021-06-01 MED ORDER — FENTANYL CITRATE (PF) 100 MCG/2ML IJ SOLN
INTRAMUSCULAR | Status: DC | PRN
  Administered 2021-06-01: 25 ug via INTRAVENOUS
  Administered 2021-06-01: 50 ug via INTRAVENOUS
  Administered 2021-06-01: 25 ug via INTRAVENOUS

## 2021-06-01 MED ORDER — CHLORHEXIDINE GLUCONATE 0.12 % MT SOLN: 15.0000 mL | Freq: Once | OROMUCOSAL | Status: AC

## 2021-06-01 MED ORDER — OXYBUTYNIN CHLORIDE 5 MG PO TABS: 5.0000 mg | ORAL_TABLET | Freq: Three times a day (TID) | ORAL | 0 refills | Status: DC | PRN

## 2021-06-01 MED ORDER — PROMETHAZINE HCL 25 MG/ML IJ SOLN: 6.2500 mg | INTRAMUSCULAR | Status: DC | PRN

## 2021-06-01 MED ORDER — SODIUM CHLORIDE 0.9 % IR SOLN
Status: DC | PRN
  Administered 2021-06-01: 3000 mL

## 2021-06-01 MED ORDER — FENTANYL CITRATE (PF) 100 MCG/2ML IJ SOLN: 25.0000 ug | INTRAMUSCULAR | Status: DC | PRN

## 2021-06-01 MED ORDER — ROCURONIUM BROMIDE 10 MG/ML (PF) SYRINGE
PREFILLED_SYRINGE | INTRAVENOUS | Status: AC
  Filled 2021-06-01: qty 10

## 2021-06-01 MED ORDER — OXYCODONE HCL 5 MG PO TABS: 5.0000 mg | ORAL_TABLET | Freq: Once | ORAL | Status: DC | PRN

## 2021-06-01 MED ORDER — IOPAMIDOL (ISOVUE-200) INJECTION 41%
INTRAVENOUS | Status: DC | PRN
  Administered 2021-06-01: 10 mL via INTRAVENOUS

## 2021-06-01 SURGICAL SUPPLY — 27 items
BAG DRAIN CYSTO-URO LG1000N (MISCELLANEOUS) ×3 IMPLANT
BASKET ZERO TIP 1.9FR (BASKET) ×3 IMPLANT
BRUSH SCRUB EZ 1% IODOPHOR (MISCELLANEOUS) ×3 IMPLANT
CATH URET FLEX-TIP 2 LUMEN 10F (CATHETERS) IMPLANT
CATH URETL OPEN 5X70 (CATHETERS) ×3 IMPLANT
CNTNR SPEC 2.5X3XGRAD LEK (MISCELLANEOUS)
CONT SPEC 4OZ STER OR WHT (MISCELLANEOUS)
CONTAINER SPEC 2.5X3XGRAD LEK (MISCELLANEOUS) IMPLANT
DRAPE UTILITY 15X26 TOWEL STRL (DRAPES) ×3 IMPLANT
GAUZE 4X4 16PLY ~~LOC~~+RFID DBL (SPONGE) ×6 IMPLANT
GLOVE SURG ENC MOIS LTX SZ6.5 (GLOVE) ×3 IMPLANT
GOWN STRL REUS W/ TWL LRG LVL3 (GOWN DISPOSABLE) ×4 IMPLANT
GOWN STRL REUS W/TWL LRG LVL3 (GOWN DISPOSABLE) ×2
GUIDEWIRE GREEN .038 145CM (MISCELLANEOUS) IMPLANT
GUIDEWIRE STR DUAL SENSOR (WIRE) ×3 IMPLANT
INFUSOR MANOMETER BAG 3000ML (MISCELLANEOUS) ×3 IMPLANT
IV NS IRRIG 3000ML ARTHROMATIC (IV SOLUTION) ×3 IMPLANT
KIT TURNOVER CYSTO (KITS) ×3 IMPLANT
MANIFOLD NEPTUNE II (INSTRUMENTS) ×3 IMPLANT
PACK CYSTO AR (MISCELLANEOUS) ×3 IMPLANT
SET CYSTO W/LG BORE CLAMP LF (SET/KITS/TRAYS/PACK) ×3 IMPLANT
SHEATH URETERAL 12FRX35CM (MISCELLANEOUS) IMPLANT
STENT URET 6FRX24 CONTOUR (STENTS) ×3 IMPLANT
SURGILUBE 2OZ TUBE FLIPTOP (MISCELLANEOUS) ×3 IMPLANT
TRACTIP FLEXIVA PULSE ID 200 (Laser) ×3 IMPLANT
WATER STERILE IRR 1000ML POUR (IV SOLUTION) ×3 IMPLANT
WATER STERILE IRR 500ML POUR (IV SOLUTION) ×3 IMPLANT

## 2021-06-01 NOTE — Anesthesia Preprocedure Evaluation (Addendum)
Anesthesia Evaluation  Patient identified by MRN, date of birth, ID band Patient awake    Reviewed: Allergy & Precautions, NPO status , Patient's Chart, lab work & pertinent test results  History of Anesthesia Complications Negative for: history of anesthetic complications  Airway Mallampati: II       Dental  (+) Dental Advidsory Given, Poor Dentition, Missing,    Pulmonary neg pulmonary ROS, neg shortness of breath, neg sleep apnea, neg COPD, neg recent URI, Not current smoker,    Pulmonary exam normal        Cardiovascular hypertension, Pt. on medications (-) angina(-) Past MI and (-) CHF Normal cardiovascular exam(-) dysrhythmias (-) Valvular Problems/Murmurs     Neuro/Psych  Headaches, neg Seizures PSYCHIATRIC DISORDERS Depression    GI/Hepatic Neg liver ROS, GERD  Medicated,  Endo/Other  negative endocrine ROSneg diabetes  Renal/GU Renal disease (stones)Staghorn calculus     Musculoskeletal   Abdominal Normal abdominal exam  (+)   Peds  Hematology   Anesthesia Other Findings Past Medical History: 06/2020: COVID-19 No date: Depression     Comment:  after husbands death No date: GERD (gastroesophageal reflux disease) No date: History of chicken pox No date: History of kidney stones No date: History of measles No date: Hypertension No date: Migraine   Reproductive/Obstetrics                            Anesthesia Physical  Anesthesia Plan  ASA: 2  Anesthesia Plan: General   Post-op Pain Management:    Induction: Intravenous  PONV Risk Score and Plan: 2 and Ondansetron, Dexamethasone and Treatment may vary due to age or medical condition  Airway Management Planned: Oral ETT  Additional Equipment:   Intra-op Plan:   Post-operative Plan: Extubation in OR  Informed Consent: I have reviewed the patients History and Physical, chart, labs and discussed the procedure including  the risks, benefits and alternatives for the proposed anesthesia with the patient or authorized representative who has indicated his/her understanding and acceptance.     Dental advisory given  Plan Discussed with: CRNA, Surgeon and Anesthesiologist  Anesthesia Plan Comments:        Anesthesia Quick Evaluation

## 2021-06-01 NOTE — Anesthesia Postprocedure Evaluation (Signed)
Anesthesia Post Note  Patient: Gabrielle Barnes  Procedure(s) Performed: CYSTOSCOPY/URETEROSCOPY/HOLMIUM LASER/STENT exchange (Right)  Patient location during evaluation: PACU Anesthesia Type: General Level of consciousness: awake and alert Pain management: pain level controlled Vital Signs Assessment: post-procedure vital signs reviewed and stable Respiratory status: spontaneous breathing, nonlabored ventilation and respiratory function stable Cardiovascular status: blood pressure returned to baseline and stable Postop Assessment: no apparent nausea or vomiting Anesthetic complications: no   No notable events documented.   Last Vitals:  Vitals:   06/01/21 1212 06/01/21 1248  BP: (!) 166/85 (!) 150/75  Pulse: 78 71  Resp: 16 18  Temp: (!) 36.1 C (!) 36.3 C  SpO2: 98% 96%    Last Pain:  Vitals:   06/01/21 1248  TempSrc:   PainSc: 0-No pain                 [place] Ouch

## 2021-06-01 NOTE — Transfer of Care (Signed)
Immediate Anesthesia Transfer of Care Note  Patient: Gabrielle Barnes  Procedure(s) Performed: CYSTOSCOPY/URETEROSCOPY/HOLMIUM LASER/STENT exchange (Right)  Patient Location: PACU  Anesthesia Type:General  Level of Consciousness: awake and drowsy  Airway & Oxygen Therapy: Patient Spontanous Breathing and Patient connected to face mask oxygen  Post-op Assessment: Report given to RN and Post -op Vital signs reviewed and stable  Post vital signs: stable  Last Vitals:  Vitals Value Taken Time  BP 144/72 06/01/21 1126  Temp    Pulse 66 06/01/21 1130  Resp 15 06/01/21 1130  SpO2 100 % 06/01/21 1130  Vitals shown include unvalidated device data.  Last Pain:  Vitals:   06/01/21 0936  TempSrc: Tympanic  PainSc: 0-No pain      Patients Stated Pain Goal: 0 (16/10/96 0454)  Complications: No notable events documented.

## 2021-06-01 NOTE — Discharge Instructions (Addendum)
You have a ureteral stent in place.  This is a tube that extends from your kidney to your bladder.  This may cause urinary bleeding, burning with urination, and urinary frequency.  Please call our office or present to the ED if you develop fevers >101 or pain which is not able to be controlled with oral pain medications.  You may be given either Flomax and/ or ditropan to help with bladder spasms and stent pain in addition to pain medications.    Your stent is on a string.  It is taped to your left inner thigh.  I like you to try to keep this for at least a week if possible.  Next Monday, you may untape the stent string and pulled gently until the entire stent is removed.  You have any difficulties doing this, please contact our office and we will guide to assist you.  If the stent is inadvertently dislodged or removed prematurely, let us know during business hours.  [facility] [address], [place], [place] [address] [phone]   AMBULATORY SURGERY  DISCHARGE INSTRUCTIONS   The drugs that you were given will stay in your system until tomorrow so for the next 24 hours you should not:  Drive an automobile Make any legal decisions Drink any alcoholic beverage   You may resume regular meals tomorrow.  Today it is better to start with liquids and gradually work up to solid foods.  You may eat anything you prefer, but it is better to start with liquids, then soup and crackers, and gradually work up to solid foods.   Please notify your doctor immediately if you have any unusual bleeding, trouble breathing, redness and pain at the surgery site, drainage, fever, or pain not relieved by medication.    Additional Instructions:  Please contact your physician with any problems or Same Day Surgery at [phone], Monday through Friday 6 am to 4 pm, or [place] at [facility] number at [phone].

## 2021-06-01 NOTE — Op Note (Signed)
Date of procedure: 06/01/21  Preoperative diagnosis:  History of right staghorn calculus Right nephrolithiasis  Postoperative diagnosis:  Same as above  Procedure: Right ureteroscopy with laser lithotripsy Basket extraction of stone fragment Right retrograde pyelogram Right ureteral stent exchange on tether Interpretation of fluoroscopy less than 30 minutes  Surgeon: Hollice Espy, MD  Anesthesia: General  Complications: None  Intraoperative findings: 4 or 5 nonobstructing calyceal stones treated.  Small ureteral calculus also identified and treated in proximal ureter.  Completely cleared of stone burden on the right other than some residual dust.  Stent replaced on tether.  EBL: Minimal  Specimens: None  Drains: 6 x 24 French double-J ureteral stent on right with tether  Indication: Gabrielle Barnes is a 70 y.o. patient with bilateral staghorn nephrolithiasis status post multiple stones procedures including most recently right PCNL who returns today for staged management to clear her residual stone burden.  After reviewing the management options for treatment, she elected to proceed with the above surgical procedure(s). We have discussed the potential benefits and risks of the procedure, side effects of the proposed treatment, the likelihood of the patient achieving the goals of the procedure, and any potential problems that might occur during the procedure or recuperation. Informed consent has been obtained.  Description of procedure:  The patient was taken to the operating room and general anesthesia was induced.  The patient was placed in the dorsal lithotomy position, prepped and draped in the usual sterile fashion, and preoperative antibiotics were administered. A preoperative time-out was performed.   A 21 French the scope was advanced per urethra into the bladder.  Attention was turned to the right ureteral orifice from which a ureteral stent was seen emanating.  The distal  coil of the stent was grasped and brought to the level of the urethral meatus.  It was then cannulated using a sensor wire up to the level of the kidney.  I used a dual-lumen access sheath to introduce a second Super Stiff wire up to the level of the kidney.  On scout imaging, multiple stones, at least 4 or 5 larger calyceal stones measuring up to 1 cm were identified.  I advanced a [name] 12/14 French ureteral access sheath which went easily to the proximal ureter.  Then used a flexible digital dual-lumen ureteroscope, 8 French diameter to advance to the level of the renal pelvis.  There is a stone encountered within the renal pelvis and that I treated each of the calyceal stones using scout imaging to guide my way to each of the stones.  Most of the stones were relatively soft today was able to use a 242 micro laser fiber using dusting settings of 0.3 J and 80 Hz to dust the stones.  A few of the stones were slightly harder and I ended up using a 1.9 French tipless nitinol basket to remove.  I did have a little bit of difficulty navigating to an upper pole calyx which had a slight infundibular stenosis, the laser was used to open up this infundibulum but I was able to access it and obliterate the stone.  In the end, no radiographic stones remained.  There was a large amount of stone dust debris however none of this was much larger than tip of the laser fiber.  I injected contrast through the scope for a final retrograde pyelogram which showed some dilation of the collecting system but no overt filling defects or contrast extravasation.  I then backed the scope down  the length of the ureter.  There was a stone in the mid proximal ureter which was able to be extracted using a basket.  There were no other significant residual stones remaining although I did feel some granularity removing the last portion of the scope and as such, I advanced a semirigid ureteroscope into the distal ureter all the way up to the mid  ureter and another small fragment was removed via basket.  Finally, a 6 x 24 French double-J ureteral stent was advanced over the wire up to the level of the kidney.  This created a large hook over the lower pole and a curl in the bladder.  I left the stent on a tether which was affixed to the patient's left inner thigh using vessel Tegaderm.  She was cleaned and dried, repositioned in supine position, reversed of anesthesia, and taken the PACU in stable condition.  Plan: We will have her keep her stent for about a week given the amount of dust and debris.  I have her follow-up in 4 weeks thereafter with renal ultrasound prior.     Hollice Espy, M.D.

## 2021-06-01 NOTE — Anesthesia Procedure Notes (Addendum)
Procedure Name: Intubation Date/Time: 06/01/2021 10:02 AM Performed by: Natasha Mead, CRNA Pre-anesthesia Checklist: Patient identified, Emergency Drugs available, Suction available and Patient being monitored Patient Re-evaluated:Patient Re-evaluated prior to induction Oxygen Delivery Method: Circle system utilized Preoxygenation: Pre-oxygenation with 100% oxygen Induction Type: IV induction Ventilation: Mask ventilation without difficulty Laryngoscope Size: McGraph and 4 Grade View: Grade I Tube type: Oral Tube size: 6.5 mm Number of attempts: 1 Airway Equipment and Method: Stylet and Oral airway Placement Confirmation: ETT inserted through vocal cords under direct vision, positive ETCO2 and breath sounds checked- equal and bilateral Secured at: 21 cm Tube secured with: Tape Dental Injury: Teeth and Oropharynx as per pre-operative assessment

## 2021-06-01 NOTE — Progress Notes (Signed)
Patient able to void without event, reviewed discharge instructions with patient and daughter Darlin Drop over the phone. Daughter can pick up patient up until 1300ish. Patient in chair, tolerating water and crackers per request. Denies pain.  Discussed at length with patient removal of stent next Monday, and bladder spasm medications. Verbalizes understanding of plan of care.

## 2021-06-01 NOTE — H&P (Signed)
06/01/21   [facility] CTAB  See H&P as below.  No changes in her medical history.  Since this initial H&P was performed, she underwent right PCNL with dual access which was technically uncomplicated.  She returns today for definitive management of all residual stone burden.  All questions were answered.  Preoperative urine culture negative.   Gabrielle Barnes 01-Sep-1950 [id number]   Referring provider: Rusty Aus, MD [facility] [place],  [place] [address]          Chief Complaint  Patient presents with   Nephrolithiasis      Discuss stone management       HPI: 70 year old female with bilateral staghorn's, chronic UTIs who is now status post treatment of her left-sided stone burden with PCNL and ureteroscopy x2.  She presents today for discussion of management of her right-sided full staghorn calculus.  She notes today that she is scheduled for renal ultrasound tomorrow which is her 70th birthday.  This is ordered by her primary care physician due to elevation in her creatinine to 1.6 from baseline of 1.  Since trended back to 1.2 which is reassuring.  She has no UTI symptoms today but waxes and wanes with the symptoms.  Urinalysis is chronically positive.  She does have intermittent right flank pain from her stone.  She also notes some low left-sided back pain overlying her sacrum exacerbated with movement.  She is anxious to move forward with the goal of becoming stone free.  She is accompanied today by her daughter.     PMH:        Past Medical History:  Diagnosis Date   COVID-19 06/2020   Depression      after husbands death   GERD (gastroesophageal reflux disease)     History of chicken pox     History of kidney stones     History of measles     Hypertension     Migraine        Surgical History:          Past Surgical History:  Procedure Laterality Date   ABDOMINAL HYSTERECTOMY       APPENDECTOMY       CESAREAN SECTION        colonoscopy       CYSTOSCOPY WITH HOLMIUM LASER LITHOTRIPSY   08/18/2020    Procedure: HOLMIUM LASER LITHOTRIPSY;  Surgeon: Hollice Espy, MD;  Location: [facility] OR[facility];  Service: Urology;;   CYSTOSCOPY/URETEROSCOPY/HOLMIUM LASER/STENT PLACEMENT Left 09/15/2020    Procedure: CYSTOSCOPY/URETEROSCOPY/HOLMIUM LASER/STENT Exchange;  Surgeon: Hollice Espy, MD;  Location: [facility];  Service: Urology;  Laterality: Left;   CYSTOSCOPY/URETEROSCOPY/HOLMIUM LASER/STENT PLACEMENT Left 10/16/2020    Procedure: CYSTOSCOPY/URETEROSCOPY/HOLMIUM LASER/STENT Exchange;  Surgeon: Hollice Espy, MD;  Location: [facility];  Service: Urology;  Laterality: Left;   IR NEPHROSTOMY PLACEMENT LEFT   08/18/2020   NEPHROLITHOTOMY Left 08/18/2020    Procedure: NEPHROLITHOTOMY PERCUTANEOUS;  Surgeon: Hollice Espy, MD;  Location: [facility];  Service: Urology;  Laterality: Left;  possibly appropriate for [place] Right 06/13/2020    Procedure: XI ROBOTIC LAPAROSCOPIC ASSISTED APPENDECTOMY, RIGHT COLECTOMY;  Surgeon: Ronny Bacon, MD;  Location: [facility];  Service: General;  Laterality: Right;      Home Medications:  Allergies as of 02/18/2021         Reactions    Keflex [cephalexin] Nausea Only            Medication List  Accurate as of February 18, 2021 11:59 PM. If you have any questions, ask your nurse or doctor.              STOP taking these medications     cephALEXin 500 MG capsule Commonly known as: Keflex Stopped by: Hollice Espy, MD    losartan-hydrochlorothiazide 50-12.5 MG tablet Commonly known as: HYZAAR Stopped by: Hollice Espy, MD    ondansetron 4 MG tablet Commonly known as: Zofran Stopped by: Hollice Espy, MD           TAKE these medications     alendronate 70 MG tablet Commonly known as: FOSAMAX Take 1 tablet (70 mg total) by mouth once a week. Take with a full glass of water on an empty stomach. What changed: when to  take this    amLODipine 5 MG tablet Commonly known as: NORVASC Take 5 mg by mouth daily.    CALCIUM 600/VITAMIN D3 PO Take 1 tablet by mouth daily.    diphenoxylate-atropine 2.5-0.025 MG tablet Commonly known as: LOMOTIL Take 1 tablet by mouth 4 (four) times daily as needed for diarrhea or loose stools.    docusate sodium 100 MG capsule Commonly known as: COLACE Take 1 capsule (100 mg total) by mouth 2 (two) times daily.    dorzolamide-timolol 22.3-6.8 MG/ML ophthalmic solution Commonly known as: COSOPT Place 1 drop into both eyes 2 (two) times daily.    famotidine 20 MG tablet Commonly known as: PEPCID TAKE 1 TABLET BY MOUTH TWICE A DAY What changed:  how much to take when to take this reasons to take this    ibuprofen 800 MG tablet Commonly known as: ADVIL Take 1 tablet (800 mg total) by mouth every 8 (eight) hours as needed.    lovastatin 40 MG tablet Commonly known as: MEVACOR Take 40 mg by mouth 2 (two) times daily.    magic mouthwash (nystatin, hydrocortisone, diphenhydrAMINE) suspension Swish and spit 5 mLs 3 (three) times daily as needed for mouth pain.    traZODone 100 MG tablet Commonly known as: DESYREL TAKE 1 TABLET BY MOUTH EVERY NIGHT AT BEDTIME             Allergies:         Allergies  Allergen Reactions   Keflex [Cephalexin] Nausea Only      Family History:          Family History  Problem Relation Age of Onset   Dementia Mother     Stroke Father     Hypertension Father     Cancer Neg Hx     Diabetes Neg Hx        Social History:  reports that she has never smoked. She has never used smokeless tobacco. She reports that she does not drink alcohol and does not use drugs.     Physical Exam: BP (!) 160/75   Pulse 68   Ht 5' (1.524 m)   Wt 139 lb (63 kg)   BMI 27.15 kg/m   Constitutional:  Alert and oriented, No acute distress. HEENT: [place] AT, moist mucus membranes.  Trachea midline, no masses. Cardiovascular: No clubbing,  cyanosis, or edema. Respiratory: Normal respiratory effort, no increased work of breathing. Skin: No rashes, bruises or suspicious lesions. Neurologic: Grossly intact, no focal deficits, moving all 4 extremities. Psychiatric: Normal mood and affect.   Laboratory Data: Creatinine 0.6 - 1.1 mg/dL 1.2 High        Urinalysis  Results for orders placed or performed in visit on 02/18/21  Microscopic Examination    Urine  Result Value Ref Range    WBC, UA 0-5 0 - 5 /hpf    RBC >30 (A) 0 - 2 /hpf    Epithelial Cells (non renal) 0-10 0 - 10 /hpf    Bacteria, UA Few None seen/Few  Urinalysis, Complete  Result Value Ref Range    Specific Gravity, UA 1.020 1.005 - 1.030    pH, UA 5.5 5.0 - 7.5    Color, UA Yellow Yellow    Appearance Ur Hazy (A) Clear    Leukocytes,UA Trace (A) Negative    Protein,UA Negative Negative/Trace    Glucose, UA Negative Negative    Ketones, UA Negative Negative    RBC, UA 3+ (A) Negative    Bilirubin, UA Negative Negative    Urobilinogen, Ur 0.2 0.2 - 1.0 mg/dL    Nitrite, UA Negative Negative    Microscopic Examination See below:        Pertinent Imaging: RUS pending tomorrow     Assessment & Plan:     1. Right Staghorn calculus Now that her left side has been addressed, we will plan to move forward with her right-sided full staghorn calculus.  I recommended right PCNL, this time with multiple access sites in order to try to reduce the number of procedures.  Her infundibular anatomy on this side seems to more favorable.  We reviewed the risk including risk of bleeding, infection, damage to structures, possible need for further procedures, ureteral stent, and blood transfusion amongst others.  All questions answered  Preoperative UA urine culture.  We will follow-up with her renal ultrasound was scheduled for tomorrow to ensure that her left-sided kidney is not obstructed and healthy prior to moving forward with her right. - Urinalysis,  Complete - CULTURE, URINE COMPREHENSIVE   2. Acute kidney injury ([place]) Bump in creatinine in early June up to 1.6, now trending back down to 1.2 which is reassuring  Follow-up renal ultrasound     Hollice Espy, MD   [facility] [address], [place], [place] [address] [phone]

## 2021-06-02 ENCOUNTER — Encounter: Payer: Self-pay | Admitting: Urology

## 2021-06-02 ENCOUNTER — Other Ambulatory Visit: Payer: Self-pay

## 2021-06-02 DIAGNOSIS — N200 Calculus of kidney: Secondary | ICD-10-CM

## 2021-06-23 ENCOUNTER — Ambulatory Visit: Payer: PPO

## 2021-06-25 ENCOUNTER — Other Ambulatory Visit: Payer: Self-pay

## 2021-06-25 ENCOUNTER — Ambulatory Visit
Admission: RE | Admit: 2021-06-25 | Discharge: 2021-06-25 | Disposition: A | Discharge status: Home / Self Care | Payer: PPO | Source: Ambulatory Visit | Attending: Urology | Admitting: Urology

## 2021-06-25 DIAGNOSIS — N200 Calculus of kidney: Secondary | ICD-10-CM | POA: Diagnosis not present

## 2021-06-30 ENCOUNTER — Ambulatory Visit: Payer: PPO | Admitting: Urology

## 2021-07-06 NOTE — Progress Notes (Signed)
07/07/21 2:56 PM   Gabrielle Barnes 1950/08/26 [id number]  Referring provider:  Rusty Aus, MD [facility] [place],  [place] [address] Chief Complaint  Patient presents with   Nephrolithiasis     HPI: Gabrielle Barnes is a 70 y.o.female with a personal history of bilateral staghorn's, chronic UTIs who is now status post treatment of her left-sided stone burden with PCNL and ureteroscopy x2, who presents today for 4 week post-op follow-up with RUS.   She is s/p right ureteroscopy, right retrograde pyelogram, and right ureteral stent on 06/01/2021.  This was preceded by dual access right PCNL.  Intraoperative findings showed 4 or 5 nonobstructing calyceal stones treated.  Small ureteral calculus also identified and treated in proximal ureter.  Completely cleared of stone burden on the right other than some residual dust.   Stone analysis revealed 40% carbonate apatite and 60% struvite magnesium ammonium phosphate.   06/25/2021 RUS revealed multiple nonobstructive right nephrolithiasis measuring up to 1.6 cm and a couple of nonobstructive left nephrolithiasis measuring 0.7 and 0.8 cm. Previously identified staghorn calculus not visualized.   KUB stones up to 7 mm bilaterally, possibly in the tracts/  She is doing well today. She is accompanied by a family member.     PMH: Past Medical History:  Diagnosis Date   COVID-19 06/2020   Depression    after husbands death   GERD (gastroesophageal reflux disease)    History of chicken pox    History of kidney stones    History of measles    Hypertension    Migraine     Surgical History: Past Surgical History:  Procedure Laterality Date   ABDOMINAL HYSTERECTOMY     APPENDECTOMY     CESAREAN SECTION     x2   colonoscopy     CYSTOSCOPY W/ RETROGRADES  06/01/2021   Procedure: CYSTOSCOPY WITH RETROGRADE PYELOGRAM;  Surgeon: Hollice Espy, MD;  Location: [facility];  Service: Urology;;    CYSTOSCOPY WITH HOLMIUM LASER LITHOTRIPSY  08/18/2020   Procedure: HOLMIUM LASER LITHOTRIPSY;  Surgeon: Hollice Espy, MD;  Location: [facility];  Service: Urology;;   CYSTOSCOPY/URETEROSCOPY/HOLMIUM LASER/STENT PLACEMENT Left 09/15/2020   Procedure: CYSTOSCOPY/URETEROSCOPY/HOLMIUM LASER/STENT Exchange;  Surgeon: Hollice Espy, MD;  Location: [facility];  Service: Urology;  Laterality: Left;   CYSTOSCOPY/URETEROSCOPY/HOLMIUM LASER/STENT PLACEMENT Left 10/16/2020   Procedure: CYSTOSCOPY/URETEROSCOPY/HOLMIUM LASER/STENT Exchange;  Surgeon: Hollice Espy, MD;  Location: [facility];  Service: Urology;  Laterality: Left;   CYSTOSCOPY/URETEROSCOPY/HOLMIUM LASER/STENT PLACEMENT Right 06/01/2021   Procedure: CYSTOSCOPY/URETEROSCOPY/HOLMIUM LASER/STENT exchange;  Surgeon: Hollice Espy, MD;  Location: [facility];  Service: Urology;  Laterality: Right;   HOLMIUM LASER APPLICATION  3/66/4403   Procedure: HOLMIUM LASER APPLICATION;  Surgeon: Hollice Espy, MD;  Location: [facility];  Service: Urology;;   IR NEPHROSTOMY PLACEMENT LEFT  08/18/2020   IR URETERAL STENT RIGHT NEW ACCESS W/O SEP NEPHROSTOMY CATH  04/20/2021   NEPHROLITHOTOMY Left 08/18/2020   Procedure: NEPHROLITHOTOMY PERCUTANEOUS;  Surgeon: Hollice Espy, MD;  Location: [facility];  Service: Urology;  Laterality: Left;  possibly appropriate for [facility] 3B   NEPHROLITHOTOMY Right 04/20/2021   Procedure: NEPHROLITHOTOMY PERCUTANEOUS;  Surgeon: Hollice Espy, MD;  Location: [facility];  Service: Urology;  Laterality: Right;   XI ROBOTIC LAPAROSCOPIC ASSISTED APPENDECTOMY Right 06/13/2020   Procedure: XI ROBOTIC LAPAROSCOPIC ASSISTED APPENDECTOMY, RIGHT COLECTOMY;  Surgeon: Ronny Bacon, MD;  Location: [facility];  Service: General;  Laterality: Right;    Home Medications:  Allergies as of  07/07/2021       Reactions   Keflex [cephalexin] Nausea Only        Medication List        Accurate as of July 07, 2021  2:56 PM. If you have any  questions, ask your nurse or doctor.          STOP taking these medications    mirabegron ER 50 MG Tb24 tablet Commonly known as: MYRBETRIQ Stopped by: Hollice Espy, MD   nystatin 100000 UNIT/ML suspension Commonly known as: MYCOSTATIN Stopped by: Hollice Espy, MD   oxybutynin 5 MG tablet Commonly known as: DITROPAN Stopped by: Hollice Espy, MD   oxyCODONE-acetaminophen 5-325 MG tablet Commonly known as: PERCOCET/ROXICET Stopped by: Hollice Espy, MD       TAKE these medications    acetaminophen 500 MG tablet Commonly known as: TYLENOL Take 1,000 mg by mouth every 6 (six) hours as needed for moderate pain.   alendronate 70 MG tablet Commonly known as: FOSAMAX Take 1 tablet (70 mg total) by mouth once a week. Take with a full glass of water on an empty stomach.   amLODipine 5 MG tablet Commonly known as: NORVASC Take 5 mg by mouth every evening.   CALCIUM 600/VITAMIN D3 PO Take 1-2 tablets by mouth See admin instructions. Take 2 tablets by mouth in the morning and 1 tablet at night   dorzolamide-timolol 22.3-6.8 MG/ML ophthalmic solution Commonly known as: COSOPT Place 1 drop into both eyes 2 (two) times daily.   famotidine 20 MG tablet Commonly known as: PEPCID TAKE 1 TABLET BY MOUTH TWICE A DAY   lovastatin 40 MG tablet Commonly known as: MEVACOR Take 80 mg by mouth at bedtime.   tamsulosin 0.4 MG Caps capsule Commonly known as: Flomax Take 1 capsule (0.4 mg total) by mouth daily.   traZODone 100 MG tablet Commonly known as: DESYREL TAKE 1 TABLET BY MOUTH EVERY NIGHT AT BEDTIME   vitamin B-12 500 MCG tablet Commonly known as: CYANOCOBALAMIN Take 500 mcg by mouth in the morning and at bedtime.        Allergies:  Allergies  Allergen Reactions   Keflex [Cephalexin] Nausea Only    Family History: Family History  Problem Relation Age of Onset   Dementia Mother    Stroke Father    Hypertension Father    Cancer Neg Hx    Diabetes  Neg Hx     Social History:  reports that she has never smoked. She has never used smokeless tobacco. She reports that she does not drink alcohol and does not use drugs.   Physical Exam: BP (!) 160/87   Pulse 77   Ht 5' (1.524 m)   Wt 134 lb (60.8 kg)   BMI 26.17 kg/m   Constitutional:  Alert and oriented, No acute distress. HEENT: [place] AT, moist mucus membranes.  Trachea midline, no masses. Cardiovascular: No clubbing, cyanosis, or edema. Respiratory: Normal respiratory effort, no increased work of breathing. Skin: No rashes, bruises or suspicious lesions. Neurologic: Grossly intact, no focal deficits, moving all 4 extremities. Psychiatric: Normal mood and affect.  Laboratory Data:  Lab Results  Component Value Date   CREATININE 1.30 (H) 04/22/2021    Pertinent Imaging: CLINICAL DATA:  Right nephrolithiasis.   EXAM: RENAL / URINARY TRACT ULTRASOUND COMPLETE   COMPARISON:  Ultrasound renal 02/19/2021, CT abdomen pelvis 05/02/2020   FINDINGS: Right Kidney:   Renal measurements: 10.9 x 5.2 x 5.4 cm = volume: 160 mL. Multiple calcified stones measuring  up to 16 mm. Previously identified staghorn calculus not visualized. Echogenicity within normal limits. No mass or hydronephrosis visualized.   Left Kidney:   Renal measurements: 8.8 x 4.6 x 4.2 cm = volume: 90 mL. A 7 mm and 8 mm calcified stone. Echogenicity within normal limits. No mass or hydronephrosis visualized.   Urinary bladder:   Appears normal for degree of bladder distention.   Other:   None.   IMPRESSION: 1. Multiple nonobstructive right nephrolithiasis measuring up to 1.6 cm. 2. Couple of nonobstructive left nephrolithiasis measuring 0.7 and 0.8 cm.     Electronically Signed   By: Iven Finn M.D.   On: 06/26/2021 18:04  Renal ultrasound was personally reviewed today.  I also compared it to KUB today which shows multiple bilateral stones up to 7 mm, possibly within the tracks especially  in the right upper and lower poles.  Left-sided stone burden may be in a lower pole calyx.  Assessment & Plan:    Bilateral Staghorn calculi - S/p  B PCNL and mutliple bilateral URS - Recent RUS showed no evidence of staghorn calculus visual hydronephrosis - Discussed stone analysis - We discussed general stone prevention techniques including drinking plenty water with goal of producing 2.5 L urine daily, increased citric acid intake, avoidance of high oxalate containing foods, and decreased salt intake.  Information about dietary recommendations given today.   He benefit from cranberry supplements as these are likely infection based stones.  Also encouraged to citric acid. -KUB today for baseline to use for future comparison, stones may be partially embedded and PCNL tracts.  KUB in 6 months  I,Kailey Littlejohn,acting as a scribe for Hollice Espy, MD.,have documented all relevant documentation on the behalf of Hollice Espy, MD,as directed by  Hollice Espy, MD while in the presence of Hollice Espy, MD.  I have reviewed the above documentation for accuracy and completeness, and I agree with the above.   Hollice Espy, MD  [facility] [address], [place], [place] [address] [phone]

## 2021-07-07 ENCOUNTER — Ambulatory Visit (INDEPENDENT_AMBULATORY_CARE_PROVIDER_SITE_OTHER): Payer: PPO | Admitting: Urology

## 2021-07-07 ENCOUNTER — Ambulatory Visit
Admission: RE | Admit: 2021-07-07 | Discharge: 2021-07-07 | Disposition: A | Discharge status: Home / Self Care | Payer: PPO | Source: Ambulatory Visit | Attending: Urology | Admitting: Urology

## 2021-07-07 ENCOUNTER — Other Ambulatory Visit: Payer: Self-pay

## 2021-07-07 ENCOUNTER — Ambulatory Visit
Admission: RE | Admit: 2021-07-07 | Discharge: 2021-07-07 | Disposition: A | Discharge status: Home / Self Care | Payer: PPO | Attending: Urology | Admitting: Urology

## 2021-07-07 ENCOUNTER — Encounter: Payer: Self-pay | Admitting: Urology

## 2021-07-07 VITALS — BP 160/87 | HR 77 | Ht 60.0 in | Wt 134.0 lb

## 2021-07-07 DIAGNOSIS — N200 Calculus of kidney: Secondary | ICD-10-CM

## 2021-07-07 DIAGNOSIS — Z87442 Personal history of urinary calculi: Secondary | ICD-10-CM

## 2021-07-17 DIAGNOSIS — E538 Deficiency of other specified B group vitamins: Secondary | ICD-10-CM | POA: Diagnosis not present

## 2021-07-17 DIAGNOSIS — M818 Other osteoporosis without current pathological fracture: Secondary | ICD-10-CM | POA: Diagnosis not present

## 2021-07-17 DIAGNOSIS — E782 Mixed hyperlipidemia: Secondary | ICD-10-CM | POA: Diagnosis not present

## 2021-07-24 DIAGNOSIS — N200 Calculus of kidney: Secondary | ICD-10-CM | POA: Diagnosis not present

## 2021-07-24 DIAGNOSIS — M818 Other osteoporosis without current pathological fracture: Secondary | ICD-10-CM | POA: Diagnosis not present

## 2021-07-24 DIAGNOSIS — N1831 Chronic kidney disease, stage 3a: Secondary | ICD-10-CM | POA: Diagnosis not present

## 2021-07-24 DIAGNOSIS — E538 Deficiency of other specified B group vitamins: Secondary | ICD-10-CM | POA: Diagnosis not present

## 2021-07-24 DIAGNOSIS — E782 Mixed hyperlipidemia: Secondary | ICD-10-CM | POA: Diagnosis not present

## 2021-09-08 DIAGNOSIS — H40003 Preglaucoma, unspecified, bilateral: Secondary | ICD-10-CM | POA: Diagnosis not present

## 2021-11-04 IMAGING — CR DG LUMBAR SPINE COMPLETE 4+V
1 series · 5 of 5 positions shown · non-contrast
Comparison: None.

CLINICAL DATA: Low back pain.  Symptoms beginning 1 week ago.

EXAM:
LUMBAR SPINE - COMPLETE 4+ VIEW

[Series 1: dg lumbar spine complete 4 +v · 0.14mm/px · 5 of 5 slices shown]
[im 1/5]
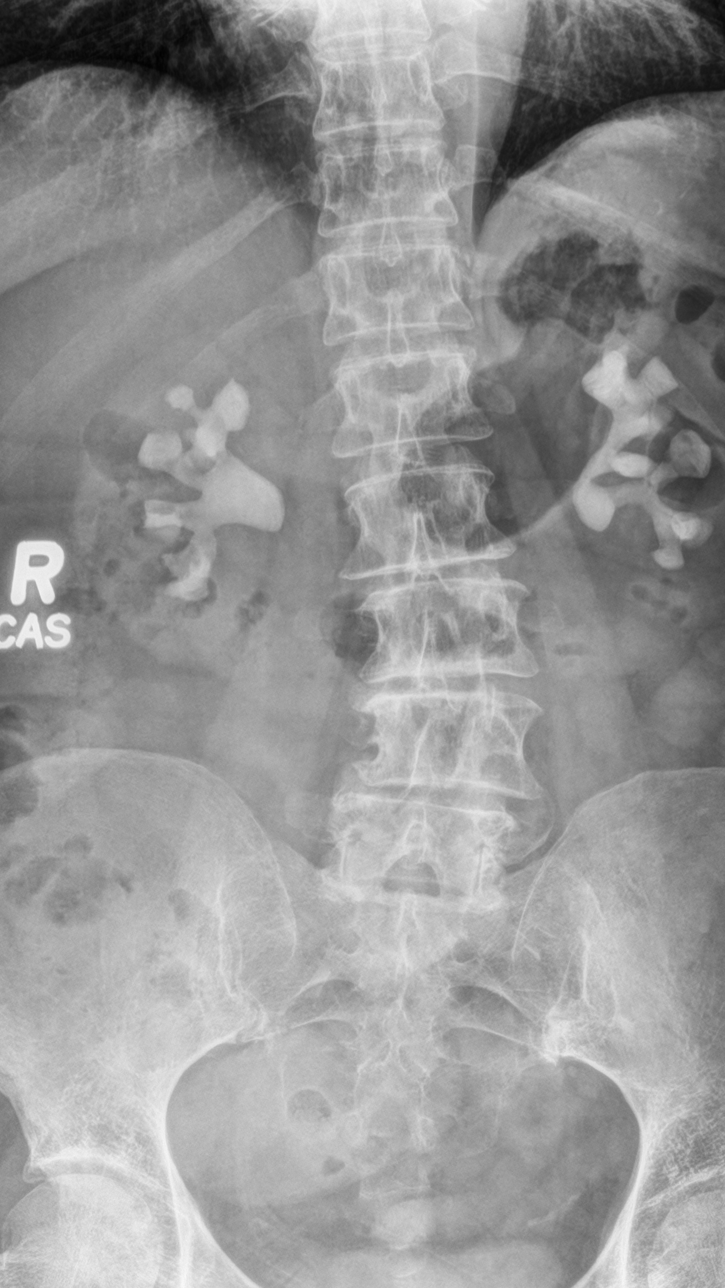
[im 2/5]
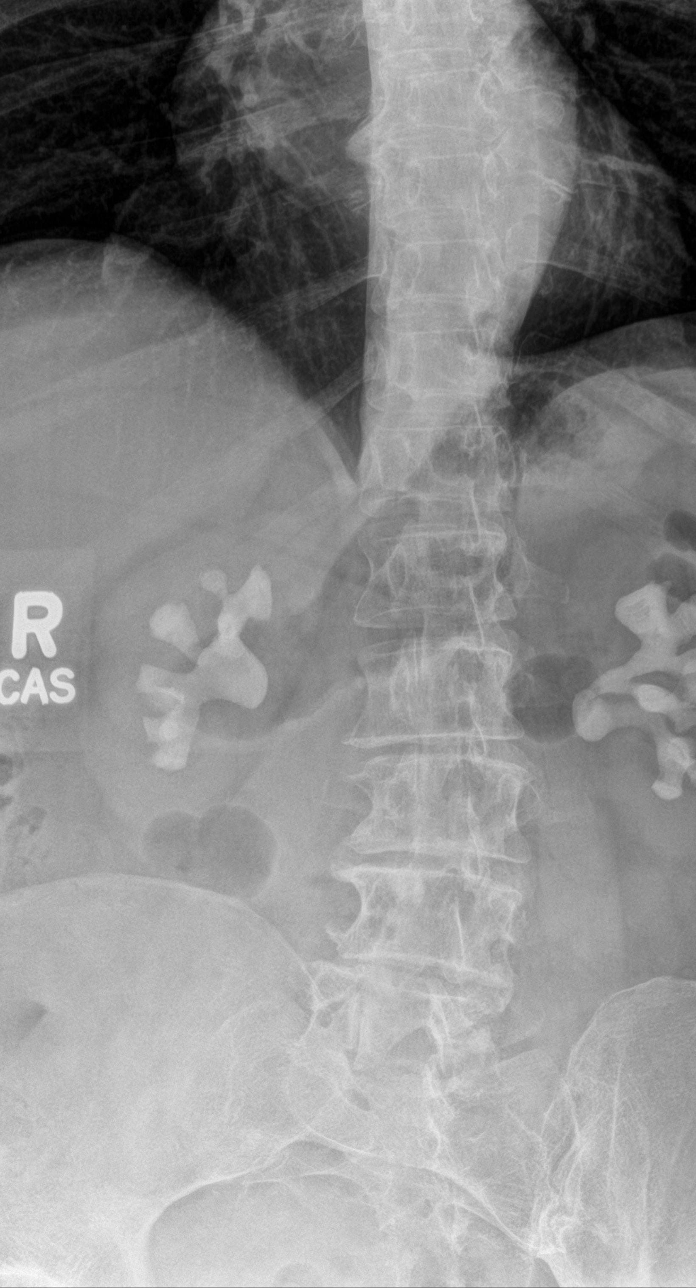
[im 3/5]
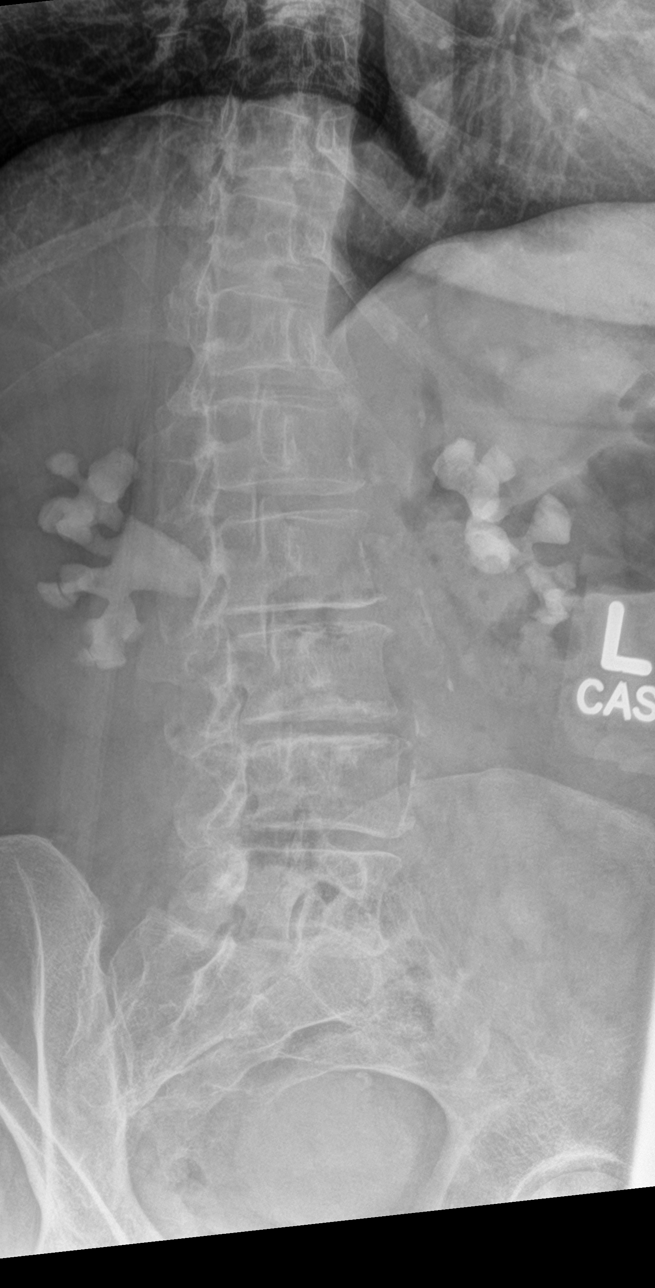
[im 4/5]
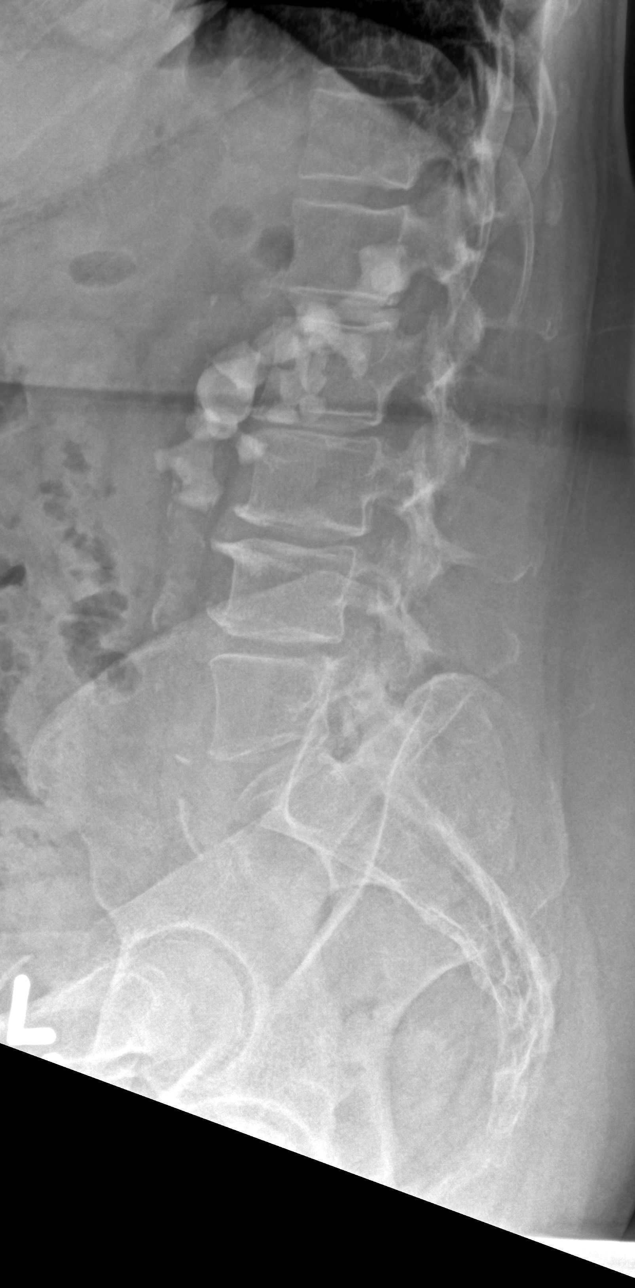
[im 5/5]
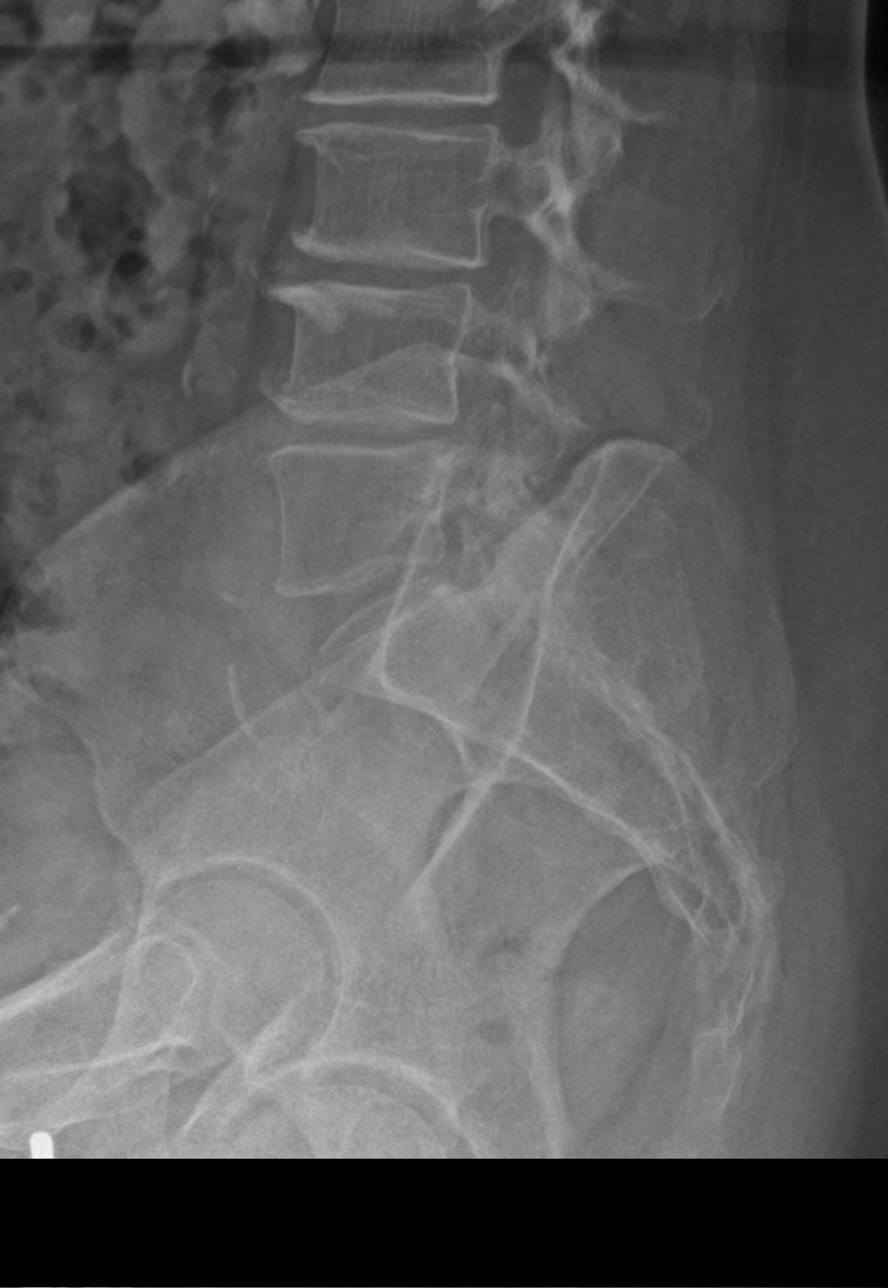

[5 of 5 positions shown; findings below may reference images not displayed]

FINDINGS: Five lumbar type vertebral bodies. Minimal convex left lumbar spine
curvature. Sacroiliac joints are symmetric. Aortic atherosclerosis.
Maintenance of vertebral body height and alignment. Relatively mild
facet arthropathy for age most apparent at L5-S1. Hyperattenuation
within both renal collecting systems.
IMPRESSION: Relatively mild lumbar spondylosis, without acute osseous
abnormality.

Hyperattenuation within both renal collecting systems. Correlate
with recent contrast administration. If no recent contrast
administration, findings would be suspicious for bilateral staghorn
type calculi.

## 2021-11-25 IMAGING — CT CT ABD-PEL WO/W CM
4 of 9 series · 13 of 46 positions shown, 18 images · IV contrast (omnipaque)
Comparison: None.

CLINICAL DATA: Bilateral renal staghorn calculi.

EXAM:
CT ABDOMEN AND PELVIS WITHOUT AND WITH CONTRAST
TECHNIQUE: Multidetector CT imaging of the abdomen and pelvis was performed
following the standard protocol before and following the bolus
administration of intravenous contrast.
CONTRAST:  100mL OMNIPAQUE IOHEXOL 300 MG/ML  SOLN

[Series 8: sag without without pre 2.00 sag · sagittal · non-contrast · 0.54mm/px · 1 of 209 slices shown]
[im 123/209  soft-tissue]
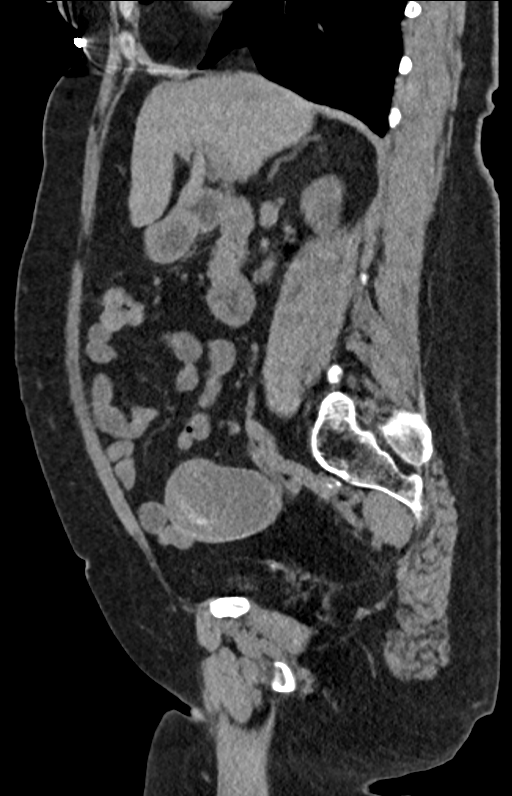

[Series 10: axial with hematuria with 5.00 · axial · 0.82mm/px · z∈[-1443,-1123]mm · 7 of 86 slices shown, 12 images]
[im 11/86  soft-tissue]
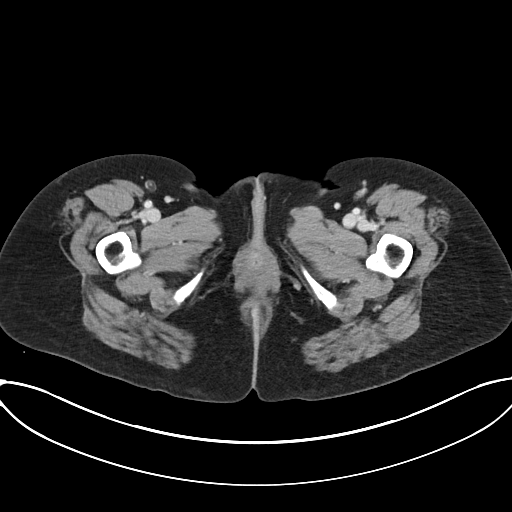
[im 11/86  bone]
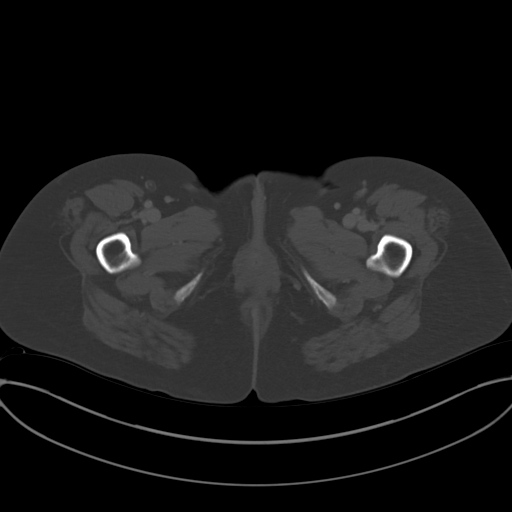
[im 22/86  soft-tissue]
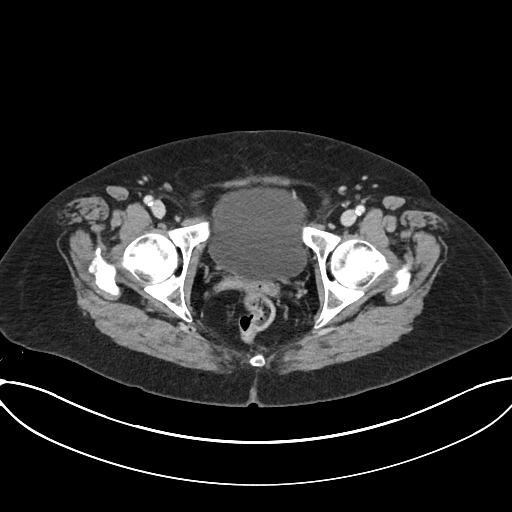
[im 32/86  soft-tissue]
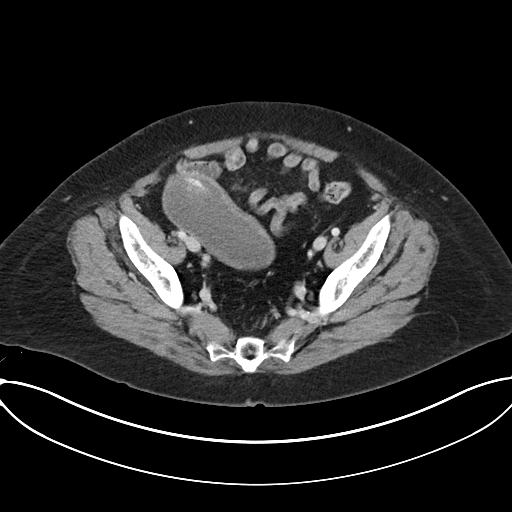
[im 43/86  soft-tissue]
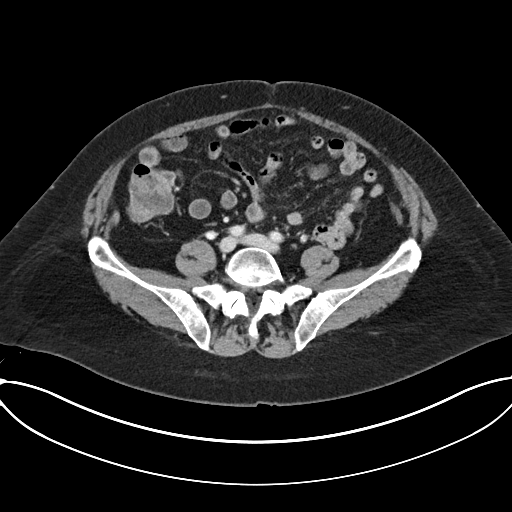
[im 43/86  lung]
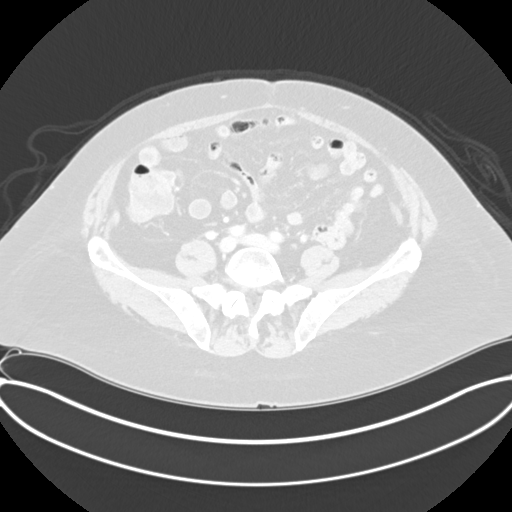
[im 54/86  soft-tissue]
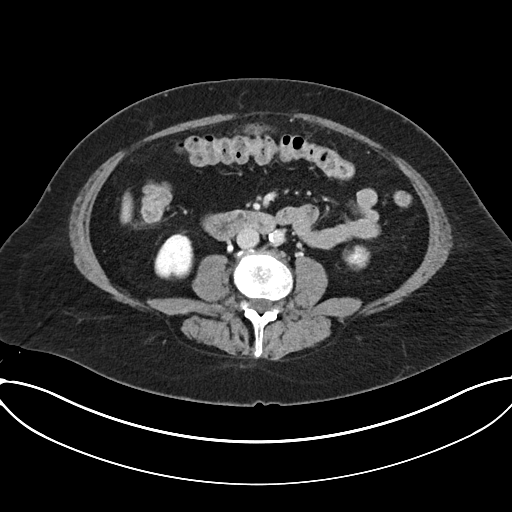
[im 54/86  lung]
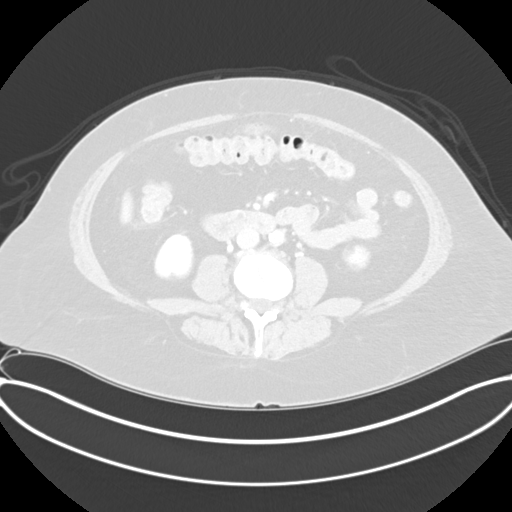
[im 64/86  soft-tissue]
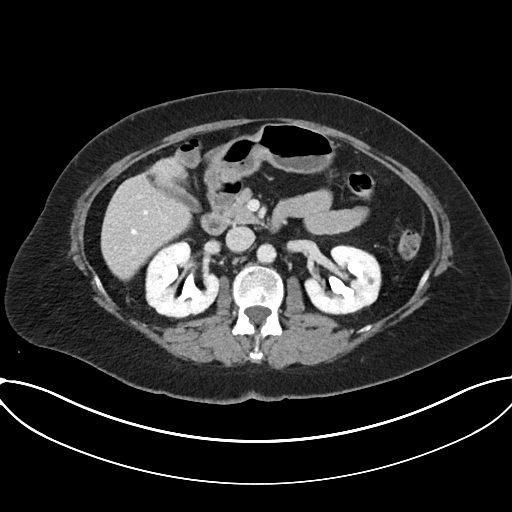
[im 64/86  lung]
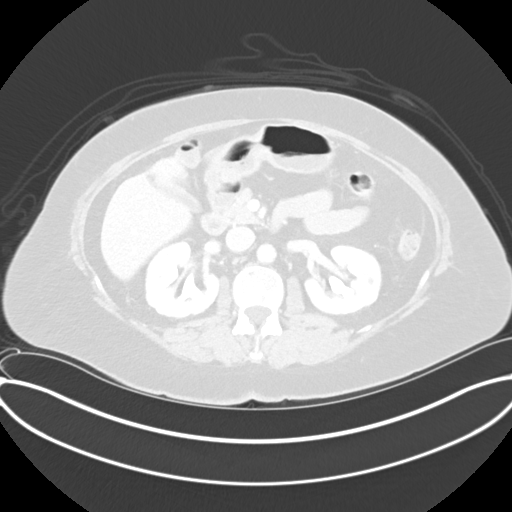
[im 75/86  soft-tissue]
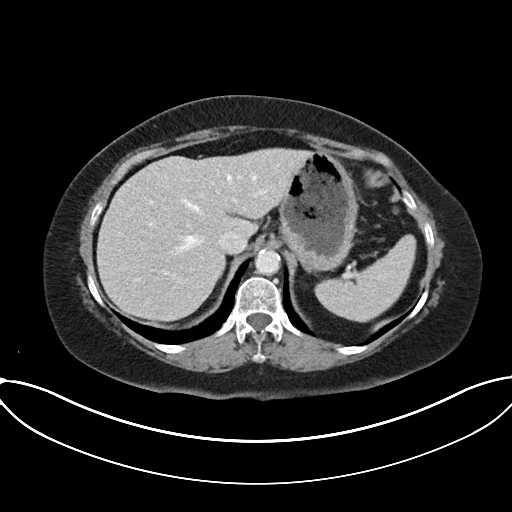
[im 75/86  lung]
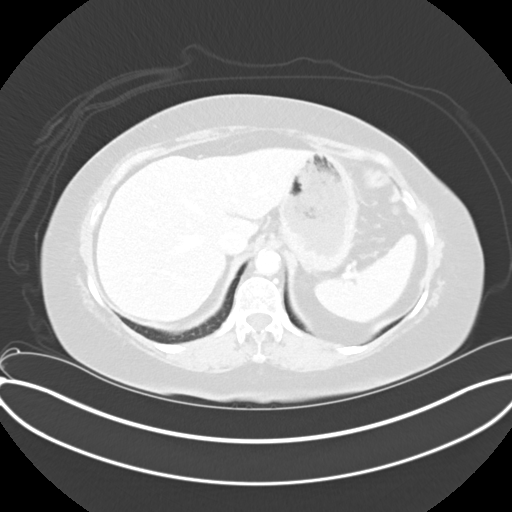

[Series 17: axial delay delay prone 5.00 · axial · delayed · 0.81mm/px · z∈[-1477,-1422]mm · 2 of 89 slices shown]
[im 12/89  soft-tissue]
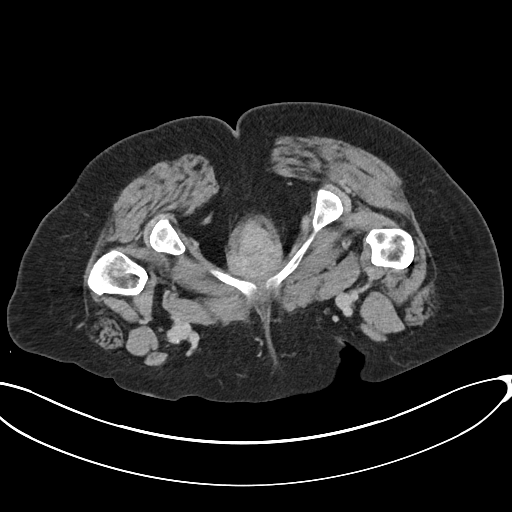
[im 23/89  soft-tissue]
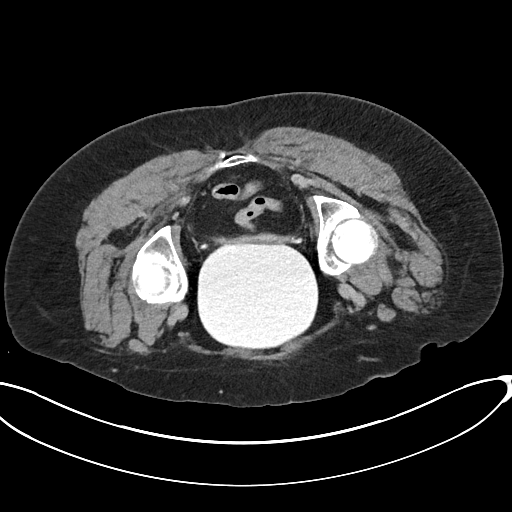

[Series 20: cor delay delay prone 2.00 cor · coronal · delayed · 0.81mm/px · 3 of 128 slices shown]
[im 43/128  soft-tissue]
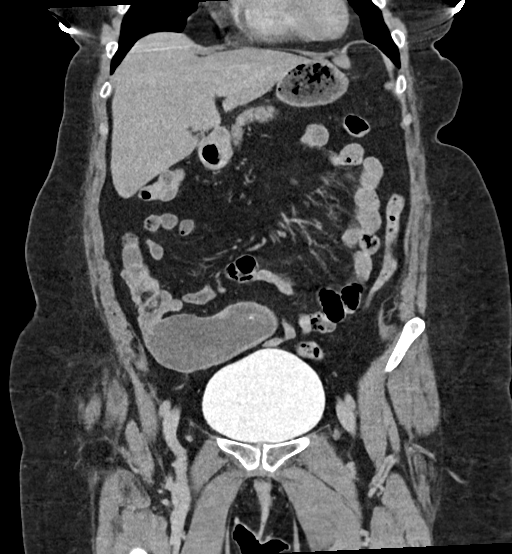
[im 57/128  soft-tissue]
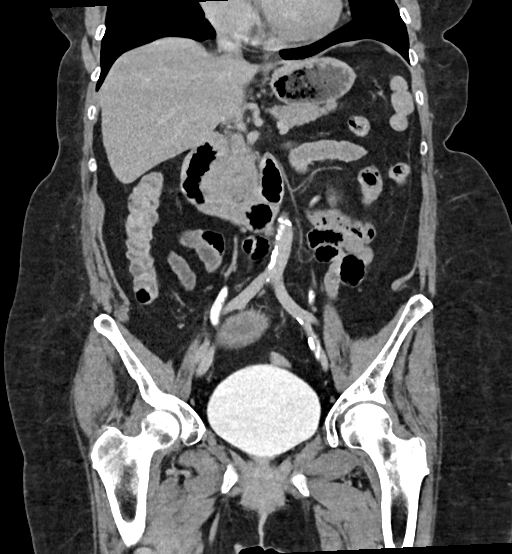
[im 71/128  soft-tissue]
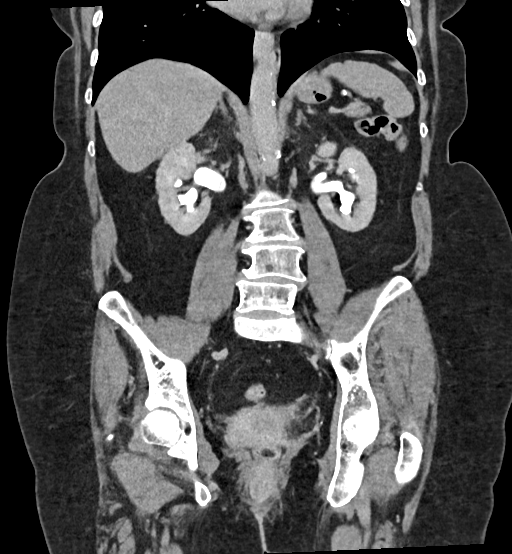

[13 of 46 positions shown; findings below may reference images not displayed]

FINDINGS: Lower Chest: No acute findings.

Hepatobiliary: No hepatic masses identified. Gallbladder is
unremarkable. No evidence of biliary ductal dilatation.

Pancreas:  No mass or inflammatory changes.

Spleen: Within normal limits in size and appearance.

Adrenals/Urinary Tract: Staghorn calculi are seen throughout the
renal collecting systems bilaterally. No evidence of ureteral
calculi or hydronephrosis. No masses seen involving the kidneys,
ureters, or bladder.

Stomach/Bowel: A large mucocele of the appendix is seen which
measures 11.4 x 4.7 cm. No associated soft tissue masses visualized
by CT. No evidence of bowel obstruction, inflammatory process or
abnormal fluid collections.

Vascular/Lymphatic: No pathologically enlarged lymph nodes. No
abdominal aortic aneurysm. Aortic atherosclerosis noted.

Reproductive: Prior hysterectomy noted. Adnexal regions are
unremarkable in appearance.

Other:  None.

Musculoskeletal:  No suspicious bone lesions identified.

34
IMPRESSION: Bilateral staghorn calculi throughout the renal collecting systems.
No evidence of ureteral calculi or hydronephrosis.

No radiographic evidence of urinary tract neoplasm.

Large mucocele of the appendix. Surgical consultation is
recommended.

Aortic Atherosclerosis ([id number]).

## 2022-01-06 ENCOUNTER — Ambulatory Visit
Admission: RE | Admit: 2022-01-06 | Discharge: 2022-01-06 | Disposition: A | Discharge status: Home / Self Care | Payer: PPO | Source: Ambulatory Visit | Attending: Urology | Admitting: Urology

## 2022-01-06 ENCOUNTER — Ambulatory Visit
Admission: RE | Admit: 2022-01-06 | Discharge: 2022-01-06 | Disposition: A | Discharge status: Home / Self Care | Payer: PPO | Attending: Urology | Admitting: Urology

## 2022-01-06 ENCOUNTER — Ambulatory Visit: Payer: PPO | Admitting: Urology

## 2022-01-06 VITALS — BP 170/102 | HR 65 | Ht 60.0 in | Wt 134.0 lb

## 2022-01-06 DIAGNOSIS — Z9889 Other specified postprocedural states: Secondary | ICD-10-CM | POA: Diagnosis not present

## 2022-01-06 DIAGNOSIS — Z87442 Personal history of urinary calculi: Secondary | ICD-10-CM

## 2022-01-06 DIAGNOSIS — M4186 Other forms of scoliosis, lumbar region: Secondary | ICD-10-CM | POA: Diagnosis not present

## 2022-01-06 DIAGNOSIS — N200 Calculus of kidney: Secondary | ICD-10-CM

## 2022-01-06 DIAGNOSIS — M47816 Spondylosis without myelopathy or radiculopathy, lumbar region: Secondary | ICD-10-CM | POA: Diagnosis not present

## 2022-01-06 NOTE — Progress Notes (Signed)
01/06/2022 4:09 PM   Gabrielle Barnes 03-16-1951 [id number]  Referring provider:  Rusty Aus, MD [facility] [place],  [place] [address] Chief Complaint  Patient presents with   Nephrolithiasis     HPI: Gabrielle Barnes is a 71 y.o.female with a personal history of bilateral staghorn's, chronic UTIs who is now status post treatment of her left-sided stone burden with PCNL and ureteroscopy x2, who presents today for 6 month follow-up with KUB.   Her most recent stone procedure in the form of right ureteroscopy, right retrograde pyelogram, and right ureteral stent was on 06/01/2021.  This was preceded by dual access right PCNL.   Stone analysis revealed 40% carbonate apatite and 60% struvite magnesium ammonium phosphate.   KUB was personally reviewed and interpreted today. It visualized 3 stones in her left kidney measuing up to about 6 mm and 3-4 in right kidney similar size to previous KUB overasll it is stable.   She reports that she has right flank pain which she treated with cranberry tablets and juice and her symptoms resolved.  He is complaining today about some fatigue.  PMH: Past Medical History:  Diagnosis Date   COVID-19 06/2020   Depression    after husbands death   GERD (gastroesophageal reflux disease)    History of chicken pox    History of kidney stones    History of measles    Hypertension    Migraine     Surgical History: Past Surgical History:  Procedure Laterality Date   ABDOMINAL HYSTERECTOMY     APPENDECTOMY     CESAREAN SECTION     x2   colonoscopy     CYSTOSCOPY W/ RETROGRADES  06/01/2021   Procedure: CYSTOSCOPY WITH RETROGRADE PYELOGRAM;  Surgeon: Hollice Espy, MD;  Location: [facility];  Service: Urology;;   CYSTOSCOPY WITH HOLMIUM LASER LITHOTRIPSY  08/18/2020   Procedure: HOLMIUM LASER LITHOTRIPSY;  Surgeon: Hollice Espy, MD;  Location: [facility];  Service: Urology;;    CYSTOSCOPY/URETEROSCOPY/HOLMIUM LASER/STENT PLACEMENT Left 09/15/2020   Procedure: CYSTOSCOPY/URETEROSCOPY/HOLMIUM LASER/STENT Exchange;  Surgeon: Hollice Espy, MD;  Location: [facility];  Service: Urology;  Laterality: Left;   CYSTOSCOPY/URETEROSCOPY/HOLMIUM LASER/STENT PLACEMENT Left 10/16/2020   Procedure: CYSTOSCOPY/URETEROSCOPY/HOLMIUM LASER/STENT Exchange;  Surgeon: Hollice Espy, MD;  Location: [facility];  Service: Urology;  Laterality: Left;   CYSTOSCOPY/URETEROSCOPY/HOLMIUM LASER/STENT PLACEMENT Right 06/01/2021   Procedure: CYSTOSCOPY/URETEROSCOPY/HOLMIUM LASER/STENT exchange;  Surgeon: Hollice Espy, MD;  Location: [facility];  Service: Urology;  Laterality: Right;   HOLMIUM LASER APPLICATION  2/44/0102   Procedure: HOLMIUM LASER APPLICATION;  Surgeon: Hollice Espy, MD;  Location: [facility];  Service: Urology;;   IR NEPHROSTOMY PLACEMENT LEFT  08/18/2020   IR URETERAL STENT RIGHT NEW ACCESS W/O SEP NEPHROSTOMY CATH  04/20/2021   NEPHROLITHOTOMY Left 08/18/2020   Procedure: NEPHROLITHOTOMY PERCUTANEOUS;  Surgeon: Hollice Espy, MD;  Location: [facility];  Service: Urology;  Laterality: Left;  possibly appropriate for [facility] 3B   NEPHROLITHOTOMY Right 04/20/2021   Procedure: NEPHROLITHOTOMY PERCUTANEOUS;  Surgeon: Hollice Espy, MD;  Location: [facility];  Service: Urology;  Laterality: Right;   XI ROBOTIC LAPAROSCOPIC ASSISTED APPENDECTOMY Right 06/13/2020   Procedure: XI ROBOTIC LAPAROSCOPIC ASSISTED APPENDECTOMY, RIGHT COLECTOMY;  Surgeon: Ronny Bacon, MD;  Location: [facility];  Service: General;  Laterality: Right;    Home Medications:  Allergies as of 01/06/2022       Reactions   Keflex [cephalexin] Nausea Only        Medication List  Accurate as of Jan 06, 2022 11:59 PM. If you have any questions, ask your nurse or doctor.          STOP taking these medications    tamsulosin 0.4 MG Caps capsule Commonly known as: Flomax       TAKE these medications     acetaminophen 500 MG tablet Commonly known as: TYLENOL Take 1,000 mg by mouth every 6 (six) hours as needed for moderate pain.   alendronate 70 MG tablet Commonly known as: FOSAMAX Take 1 tablet (70 mg total) by mouth once a week. Take with a full glass of water on an empty stomach.   amLODipine 5 MG tablet Commonly known as: NORVASC Take 5 mg by mouth every evening.   CALCIUM 600/VITAMIN D3 PO Take 1-2 tablets by mouth See admin instructions. Take 2 tablets by mouth in the morning and 1 tablet at night   Cranberry 250 MG Tabs Take by mouth.   dorzolamide-timolol 22.3-6.8 MG/ML ophthalmic solution Commonly known as: COSOPT Place 1 drop into both eyes 2 (two) times daily.   famotidine 20 MG tablet Commonly known as: PEPCID TAKE 1 TABLET BY MOUTH TWICE A DAY   lovastatin 40 MG tablet Commonly known as: MEVACOR Take 80 mg by mouth at bedtime.   traZODone 100 MG tablet Commonly known as: DESYREL TAKE 1 TABLET BY MOUTH EVERY NIGHT AT BEDTIME   vitamin B-12 500 MCG tablet Commonly known as: CYANOCOBALAMIN Take 500 mcg by mouth in the morning and at bedtime.        Allergies:  Allergies  Allergen Reactions   Keflex [Cephalexin] Nausea Only    Family History: Family History  Problem Relation Age of Onset   Dementia Mother    Stroke Father    Hypertension Father    Cancer Neg Hx    Diabetes Neg Hx     Social History:  reports that she has never smoked. She has never used smokeless tobacco. She reports that she does not drink alcohol and does not use drugs.   Physical Exam: BP (!) 170/102   Pulse 65   Ht 5' (1.524 m)   Wt 134 lb (60.8 kg)   BMI 26.17 kg/m   Constitutional:  Alert and oriented, No acute distress. HEENT: [place] AT, moist mucus membranes.  Trachea midline, no masses. Cardiovascular: No clubbing, cyanosis, or edema. Respiratory: Normal respiratory effort, no increased work of breathing. Skin: No rashes, bruises or suspicious  lesions. Neurologic: Grossly intact, no focal deficits, moving all 4 extremities. Psychiatric: Normal mood and affect.  Laboratory Data:  Lab Results  Component Value Date   CREATININE 1.30 (H) 04/22/2021    Urinalysis Results for orders placed or performed in visit on 01/06/22  Microscopic Examination   Urine  Result Value Ref Range   WBC, UA 11-30 (A) 0 - 5 /hpf   RBC 0-2 0 - 2 /hpf   Epithelial Cells (non renal) 0-10 0 - 10 /hpf   Bacteria, UA Few None seen/Few  Urinalysis, Complete  Result Value Ref Range   Specific Gravity, UA 1.015 1.005 - 1.030   pH, UA 6.0 5.0 - 7.5   Color, UA Yellow Yellow   Appearance Ur Clear Clear   Leukocytes,UA 1+ (A) Negative   Protein,UA Negative Negative/Trace   Glucose, UA Negative Negative   Ketones, UA Negative Negative   RBC, UA Negative Negative   Bilirubin, UA Negative Negative   Urobilinogen, Ur 0.2 0.2 - 1.0 mg/dL   Nitrite, UA  Negative Negative   Microscopic Examination See below:      Pertinent Imaging: KUB was personally reviewed and interpreted see HPI.    Assessment & Plan:   Right nephrolithasis  - S/p  B PCNL and mutliple bilateral URS - KUB was reviewed today and it shows stable stone burden bilaterally  -Plan to continue to monitor at this point in time, she refers to avoid another surgery in the near future - Continue general stone prevention techniques including drinking plenty water with goal of producing 2.5 L urine daily, increased citric acid intake, avoidance of high oxalate containing foods, and decreased salt intake.  Information about dietary recommendations given today.  - Continue cranberry tablets as her stone was primarily struvite, infection related -Urine culture rule out infection although she is relatively asymptomatic other than fatigue, no specific urinary symptoms  Follow-up 1 year with KUB  Conley Rolls as a scribe for Hollice Espy, MD.,have documented all relevant documentation  on the behalf of Hollice Espy, MD,as directed by  Hollice Espy, MD while in the presence of Hollice Espy, MD.  I have reviewed the above documentation for accuracy and completeness, and I agree with the above.   Hollice Espy, MD   [facility] [address] [place], [place] [address] [phone]

## 2022-01-07 LAB — URINALYSIS, COMPLETE
Bilirubin, UA: NEGATIVE
Glucose, UA: NEGATIVE
Ketones, UA: NEGATIVE
Nitrite, UA: NEGATIVE
Protein,UA: NEGATIVE
RBC, UA: NEGATIVE
Specific Gravity, UA: 1.015 (ref 1.005–1.030)
Urobilinogen, Ur: 0.2 mg/dL (ref 0.2–1.0)
pH, UA: 6 (ref 5.0–7.5)

## 2022-01-07 LAB — MICROSCOPIC EXAMINATION

## 2022-01-09 LAB — CULTURE, URINE COMPREHENSIVE

## 2022-01-12 ENCOUNTER — Telehealth: Payer: Self-pay | Admitting: *Deleted

## 2022-01-12 NOTE — Telephone Encounter (Addendum)
Left VM to return call   ----- Message from Hollice Espy, MD sent at 01/12/2022  9:02 AM EDT ----- Please let Gabrielle Barnes know that I ended up sending her urine for culture based on the presence of some white blood cells when she was in the office.  It did end up growing a lower colony count of Enterococcus.  Because she has not tolerated antibiotics particularly well in the past, I think it is reasonable to abstain from treating because at the time she was asymptomatic this but if she has any lower urinary tract symptoms, fevers or chills, will treat with Macrobid twice daily for 10 days.  Hollice Espy, MD

## 2022-01-13 NOTE — Telephone Encounter (Signed)
Patient informed, voiced understanding.  °

## 2022-01-13 NOTE — Telephone Encounter (Signed)
-----   Message from Hollice Espy, MD sent at 01/12/2022  9:02 AM EDT ----- Please let Ms. Schear know that I ended up sending her urine for culture based on the presence of some white blood cells when she was in the office.  It did end up growing a lower colony count of Enterococcus.  Because she has not tolerated antibiotics particularly well in the past, I think it is reasonable to abstain from treating because at the time she was asymptomatic this but if she has any lower urinary tract symptoms, fevers or chills, will treat with Macrobid twice daily for 10 days.  Hollice Espy, MD

## 2022-01-21 DIAGNOSIS — E538 Deficiency of other specified B group vitamins: Secondary | ICD-10-CM | POA: Diagnosis not present

## 2022-01-21 DIAGNOSIS — E782 Mixed hyperlipidemia: Secondary | ICD-10-CM | POA: Diagnosis not present

## 2022-01-28 DIAGNOSIS — E538 Deficiency of other specified B group vitamins: Secondary | ICD-10-CM | POA: Diagnosis not present

## 2022-01-28 DIAGNOSIS — Z0000 Encounter for general adult medical examination without abnormal findings: Secondary | ICD-10-CM | POA: Diagnosis not present

## 2022-01-28 DIAGNOSIS — M818 Other osteoporosis without current pathological fracture: Secondary | ICD-10-CM | POA: Diagnosis not present

## 2022-01-28 DIAGNOSIS — N1831 Chronic kidney disease, stage 3a: Secondary | ICD-10-CM | POA: Diagnosis not present

## 2022-01-28 DIAGNOSIS — E782 Mixed hyperlipidemia: Secondary | ICD-10-CM | POA: Diagnosis not present

## 2022-01-28 DIAGNOSIS — Z1212 Encounter for screening for malignant neoplasm of rectum: Secondary | ICD-10-CM | POA: Diagnosis not present

## 2022-02-12 DIAGNOSIS — Z1212 Encounter for screening for malignant neoplasm of rectum: Secondary | ICD-10-CM | POA: Diagnosis not present

## 2022-02-17 ENCOUNTER — Other Ambulatory Visit: Payer: Self-pay | Admitting: Internal Medicine

## 2022-02-17 DIAGNOSIS — Z1231 Encounter for screening mammogram for malignant neoplasm of breast: Secondary | ICD-10-CM

## 2022-02-18 LAB — COLOGUARD: COLOGUARD: NEGATIVE

## 2022-02-18 LAB — EXTERNAL GENERIC LAB PROCEDURE: COLOGUARD: NEGATIVE

## 2022-03-11 ENCOUNTER — Ambulatory Visit
Admission: RE | Admit: 2022-03-11 | Discharge: 2022-03-11 | Disposition: A | Discharge status: Home / Self Care | Payer: PPO | Source: Ambulatory Visit | Attending: Internal Medicine | Admitting: Internal Medicine

## 2022-03-11 DIAGNOSIS — Z1231 Encounter for screening mammogram for malignant neoplasm of breast: Secondary | ICD-10-CM | POA: Insufficient documentation

## 2022-03-16 ENCOUNTER — Inpatient Hospital Stay
Admission: RE | Admit: 2022-03-16 | Discharge: 2022-03-16 | Disposition: A | Discharge status: Home / Self Care | Payer: Self-pay | Source: Ambulatory Visit | Attending: *Deleted | Admitting: *Deleted

## 2022-03-16 ENCOUNTER — Other Ambulatory Visit: Payer: Self-pay | Admitting: *Deleted

## 2022-03-16 DIAGNOSIS — Z1231 Encounter for screening mammogram for malignant neoplasm of breast: Secondary | ICD-10-CM

## 2022-04-06 DIAGNOSIS — H40003 Preglaucoma, unspecified, bilateral: Secondary | ICD-10-CM | POA: Diagnosis not present

## 2022-07-23 DIAGNOSIS — E538 Deficiency of other specified B group vitamins: Secondary | ICD-10-CM | POA: Diagnosis not present

## 2022-07-23 DIAGNOSIS — E782 Mixed hyperlipidemia: Secondary | ICD-10-CM | POA: Diagnosis not present

## 2022-08-03 DIAGNOSIS — M818 Other osteoporosis without current pathological fracture: Secondary | ICD-10-CM | POA: Diagnosis not present

## 2022-08-03 DIAGNOSIS — N1831 Chronic kidney disease, stage 3a: Secondary | ICD-10-CM | POA: Diagnosis not present

## 2022-08-03 DIAGNOSIS — E782 Mixed hyperlipidemia: Secondary | ICD-10-CM | POA: Diagnosis not present

## 2022-08-03 DIAGNOSIS — N200 Calculus of kidney: Secondary | ICD-10-CM | POA: Diagnosis not present

## 2022-08-03 DIAGNOSIS — L570 Actinic keratosis: Secondary | ICD-10-CM | POA: Diagnosis not present

## 2022-08-03 DIAGNOSIS — E538 Deficiency of other specified B group vitamins: Secondary | ICD-10-CM | POA: Diagnosis not present

## 2022-09-14 IMAGING — US US RENAL
1 series · 14 of 25 positions shown · non-contrast
Comparison: Abdominal radiograph dated 11/18/2020.

CLINICAL DATA: 70-year-old female with acute renal insufficiency.

EXAM:
RENAL / URINARY TRACT ULTRASOUND COMPLETE

[Series 1: us renal · 52 acquisitions, 14 frames shown]
[im 1/52]
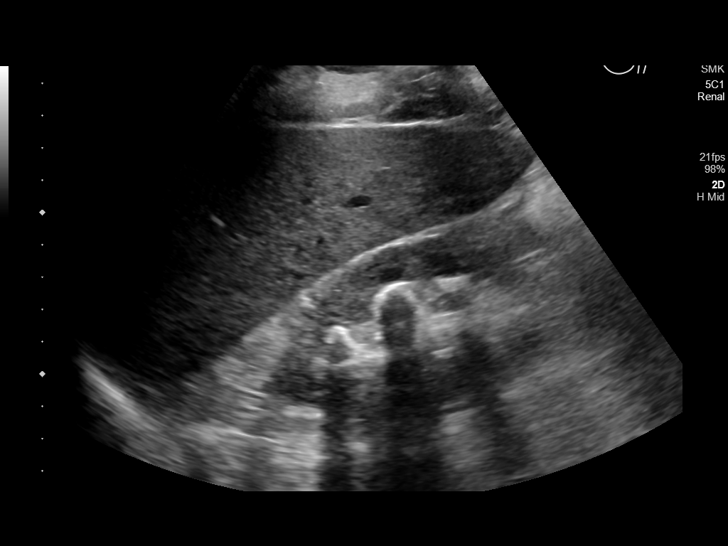
[im 5/52]
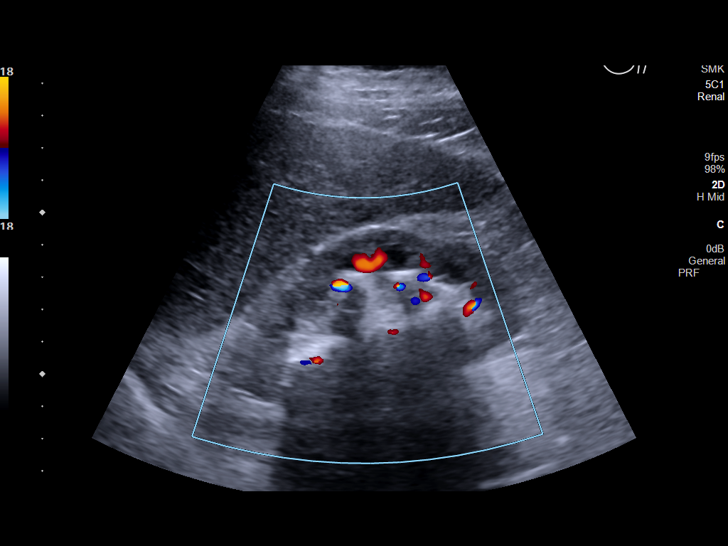
[im 9/52]
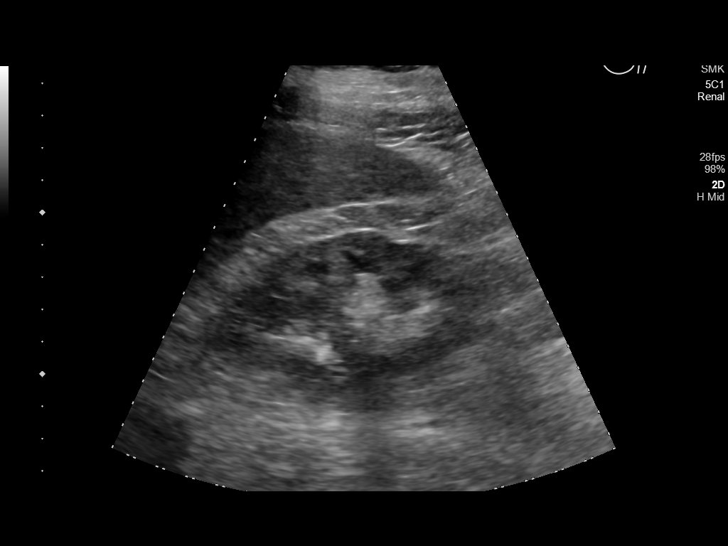
[im 13/52]
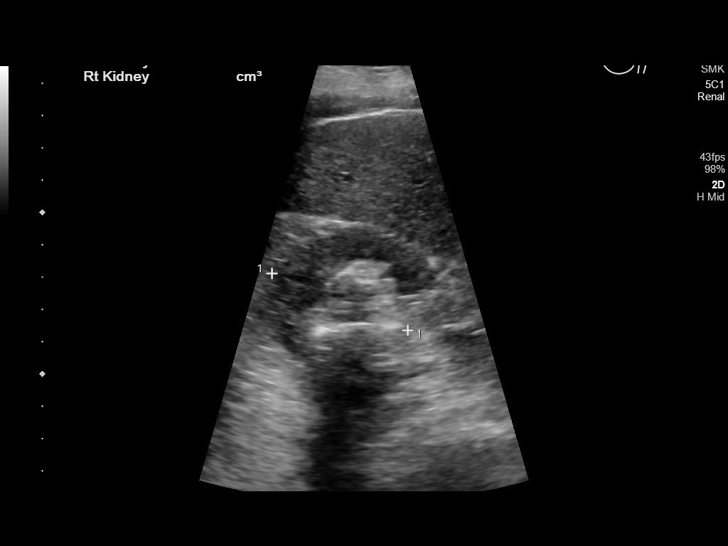
[im 18/52]
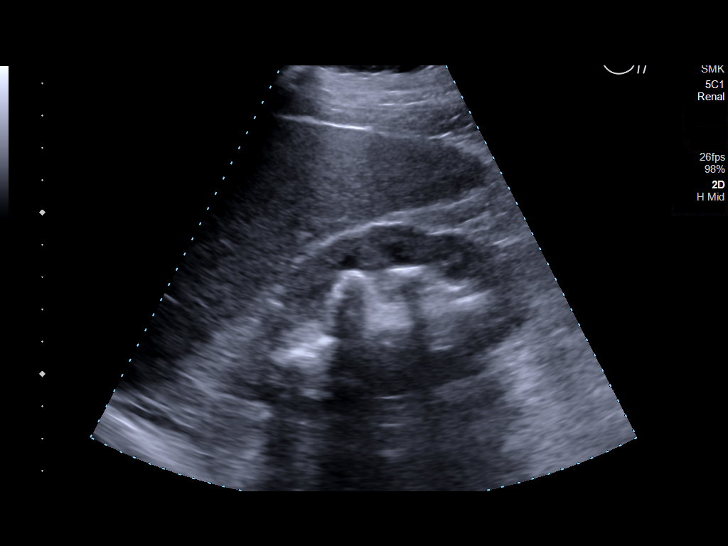
[im 20/52]
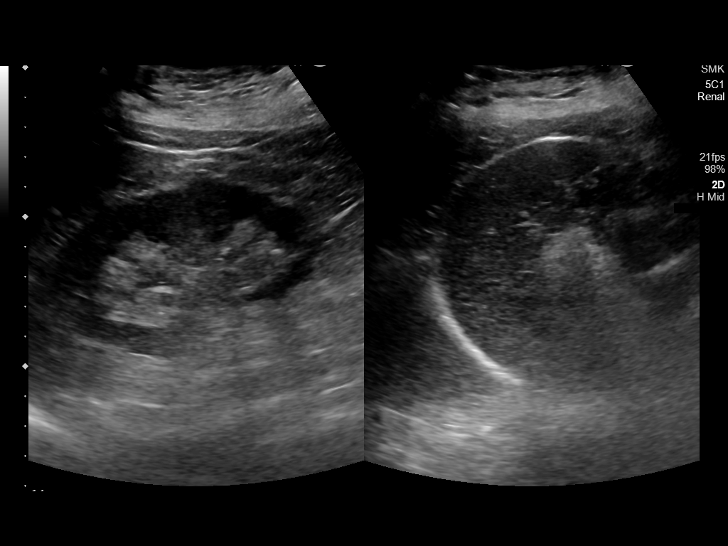
[im 24/52]
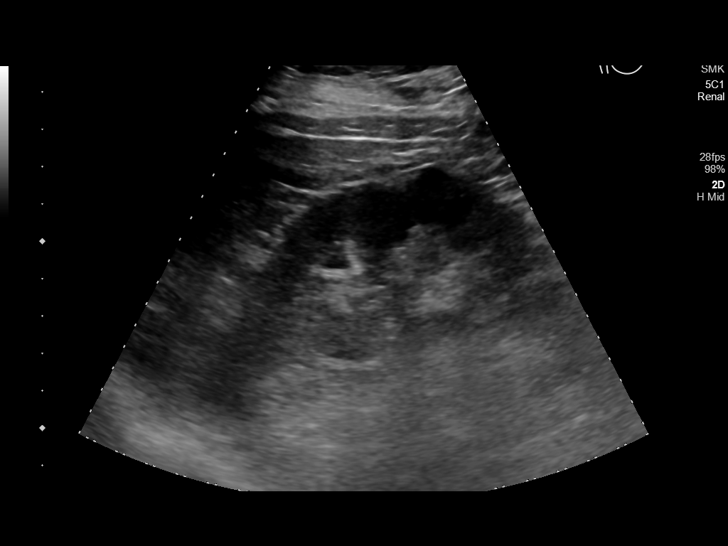
[im 28/52]
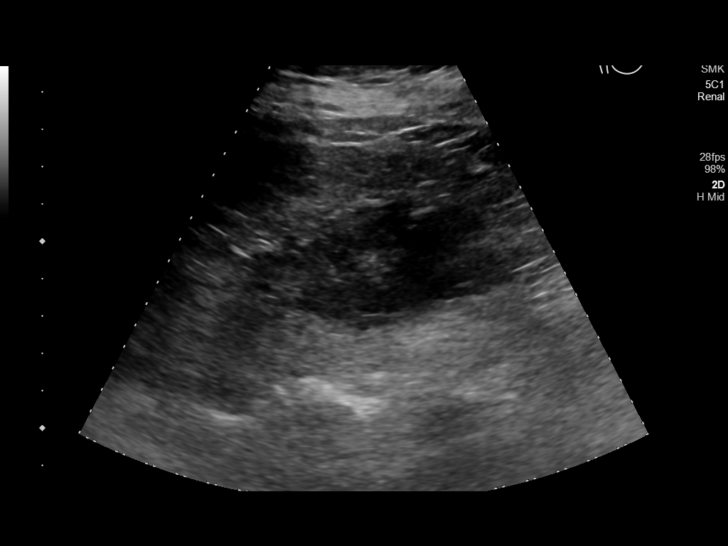
[im 32/52]
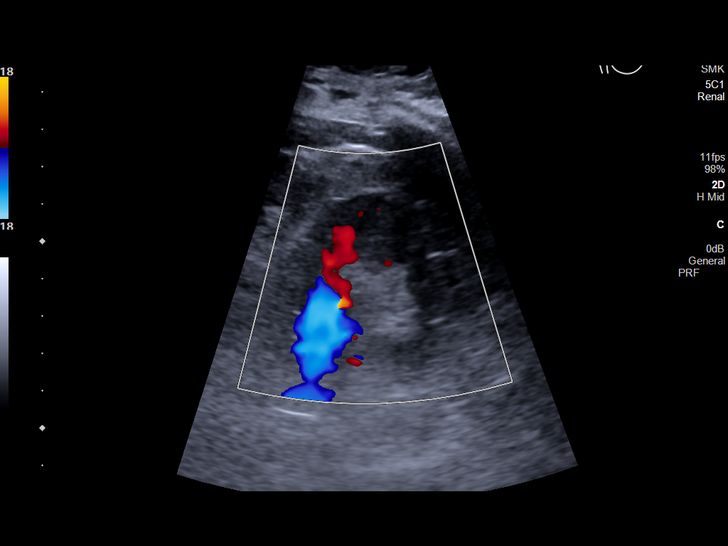
[im 35/52]
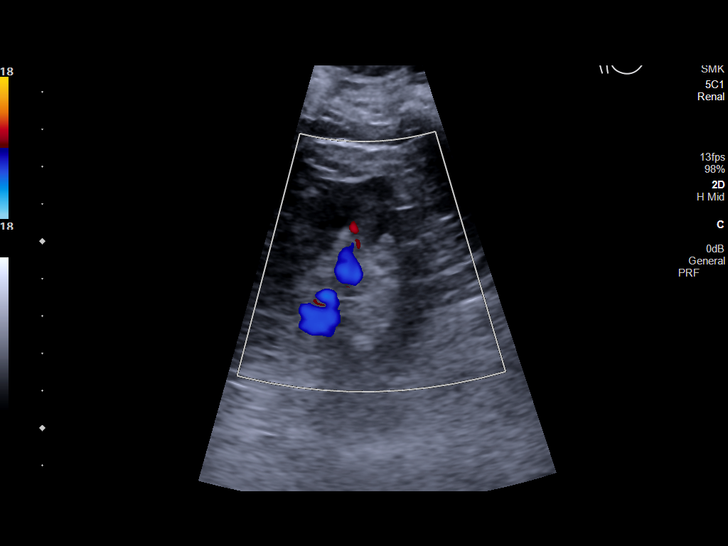
[im 39/52]
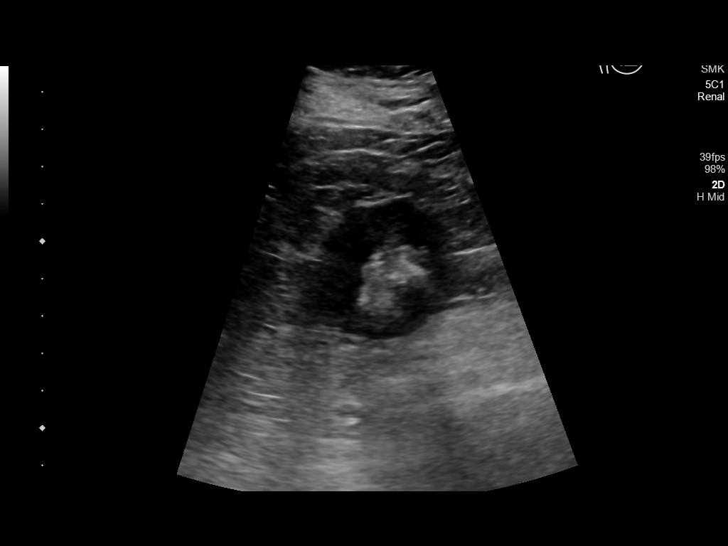
[im 43/52]
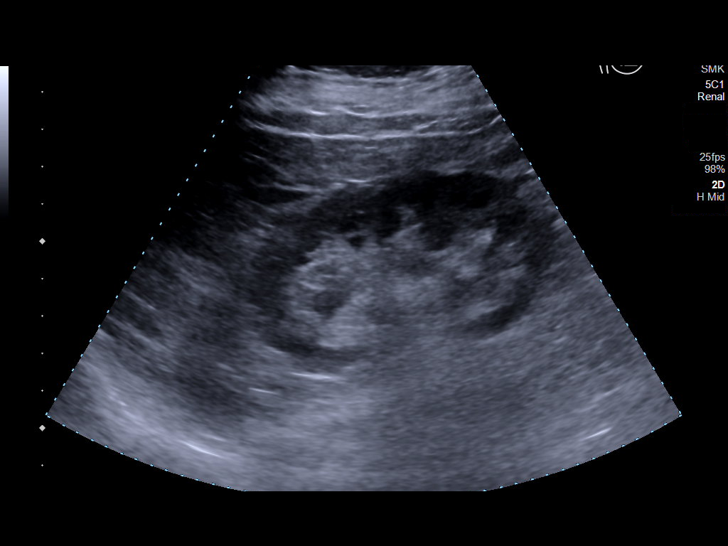
[im 47/52]
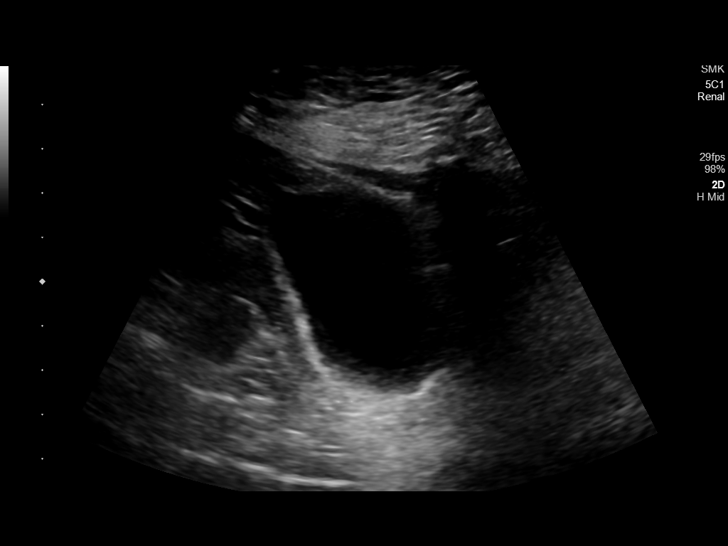
[im 52/52]
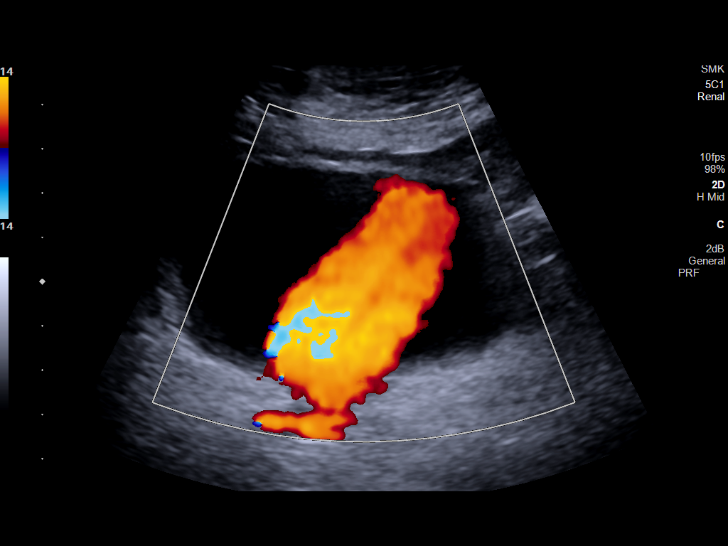

[14 of 25 positions shown; findings below may reference images not displayed]

FINDINGS: Right Kidney:

Renal measurements: 10.2 x 5.4 x 4.6 cm = volume: 132 mL. Normal
echogenicity. There is a staghorn calculus filling the collecting
systems and renal pelvis. No hydronephrosis.

Left Kidney:

Renal measurements: 9.5 x 4.7 x 4.2 cm = volume: 98 mL. No
hydronephrosis or shadowing stone. Normal echogenicity. Two small
cysts measuring up to 1 cm in the interpolar left kidney.

Bladder:

Appears normal for degree of bladder distention. Bilateral ureteral
jets visualized.

Other:

None.
IMPRESSION: 1. Large staghorn calculus filling the right renal collecting system
and pelvis. No hydronephrosis.
2. No hydronephrosis or nephrolithiasis on the left.

## 2022-10-06 DIAGNOSIS — H2513 Age-related nuclear cataract, bilateral: Secondary | ICD-10-CM | POA: Diagnosis not present

## 2022-10-06 DIAGNOSIS — H40003 Preglaucoma, unspecified, bilateral: Secondary | ICD-10-CM | POA: Diagnosis not present

## 2023-01-05 ENCOUNTER — Ambulatory Visit
Admission: RE | Admit: 2023-01-05 | Discharge: 2023-01-05 | Disposition: A | Discharge status: Home / Self Care | Payer: PPO | Attending: Urology | Admitting: Urology

## 2023-01-05 ENCOUNTER — Ambulatory Visit
Admission: RE | Admit: 2023-01-05 | Discharge: 2023-01-05 | Disposition: A | Discharge status: Home / Self Care | Payer: PPO | Source: Ambulatory Visit | Attending: Urology | Admitting: Urology

## 2023-01-05 ENCOUNTER — Ambulatory Visit (INDEPENDENT_AMBULATORY_CARE_PROVIDER_SITE_OTHER): Payer: PPO | Admitting: Urology

## 2023-01-05 VITALS — BP 167/88 | HR 67 | Ht 60.0 in | Wt 151.0 lb

## 2023-01-05 DIAGNOSIS — N200 Calculus of kidney: Secondary | ICD-10-CM | POA: Insufficient documentation

## 2023-01-05 NOTE — Progress Notes (Signed)
Gabrielle Barnes,acting as a scribe for Gabrielle Scotland, MD.,have documented all relevant documentation on the behalf of Gabrielle Scotland, MD,as directed by  Gabrielle Scotland, MD while in the presence of Gabrielle Scotland, MD.  01/05/2023 12:50 PM   Gabrielle Barnes 04-Apr-1951 [id number]  Referring provider: Danella Penton, MD [phone] [facility] [address] [facility] [place],  [place] [address]  Chief Complaint  Patient presents with   Follow-up   Nephrolithiasis    HPI: 72 year-old female who returns for annual follow up with a KUB. She has a personal history of bilateral staghorn calculi and recurrent UTI's. She underwent multiple staged procedures including right PCNL followed by ureteroscopy in 2022. Stone composition is 40% carbonate apatite and 60% struvite.   KUB was personally reviewed today and is awaiting radiologic interpretation. She has bilateral stone burden, which is essentially unchanged from last year, possibly slightly larger on the left, but the right is stable. The largest stone, the left lower side measures about 8 mm in diameter.   Her most recent creatinine in 07/2022 was 1.3 which is stable since at least 2022.  Today, she reports that she is taking cranberry tablets.  There are no documented UTI's this year.    PMH: Past Medical History:  Diagnosis Date   COVID-19 06/2020   Depression    after husbands death   GERD (gastroesophageal reflux disease)    History of chicken pox    History of kidney stones    History of measles    Hypertension    Migraine     Surgical History: Past Surgical History:  Procedure Laterality Date   ABDOMINAL HYSTERECTOMY     APPENDECTOMY     CESAREAN SECTION     x2   colonoscopy     CYSTOSCOPY W/ RETROGRADES  06/01/2021   Procedure: CYSTOSCOPY WITH RETROGRADE PYELOGRAM;  Surgeon: Gabrielle Scotland, MD;  Location: [facility];  Service: Urology;;   CYSTOSCOPY WITH HOLMIUM LASER LITHOTRIPSY  08/18/2020   Procedure:  HOLMIUM LASER LITHOTRIPSY;  Surgeon: Gabrielle Scotland, MD;  Location: [facility];  Service: Urology;;   CYSTOSCOPY/URETEROSCOPY/HOLMIUM LASER/STENT PLACEMENT Left 09/15/2020   Procedure: CYSTOSCOPY/URETEROSCOPY/HOLMIUM LASER/STENT Exchange;  Surgeon: Gabrielle Scotland, MD;  Location: [facility];  Service: Urology;  Laterality: Left;   CYSTOSCOPY/URETEROSCOPY/HOLMIUM LASER/STENT PLACEMENT Left 10/16/2020   Procedure: CYSTOSCOPY/URETEROSCOPY/HOLMIUM LASER/STENT Exchange;  Surgeon: Gabrielle Scotland, MD;  Location: [facility];  Service: Urology;  Laterality: Left;   CYSTOSCOPY/URETEROSCOPY/HOLMIUM LASER/STENT PLACEMENT Right 06/01/2021   Procedure: CYSTOSCOPY/URETEROSCOPY/HOLMIUM LASER/STENT exchange;  Surgeon: Gabrielle Scotland, MD;  Location: [facility];  Service: Urology;  Laterality: Right;   HOLMIUM LASER APPLICATION  04/20/2021   Procedure: HOLMIUM LASER APPLICATION;  Surgeon: Gabrielle Scotland, MD;  Location: [facility];  Service: Urology;;   IR NEPHROSTOMY PLACEMENT LEFT  08/18/2020   IR URETERAL STENT RIGHT NEW ACCESS W/O SEP NEPHROSTOMY CATH  04/20/2021   NEPHROLITHOTOMY Left 08/18/2020   Procedure: NEPHROLITHOTOMY PERCUTANEOUS;  Surgeon: Gabrielle Scotland, MD;  Location: [facility];  Service: Urology;  Laterality: Left;  possibly appropriate for [facility]   NEPHROLITHOTOMY Right 04/20/2021   Procedure: NEPHROLITHOTOMY PERCUTANEOUS;  Surgeon: Gabrielle Scotland, MD;  Location: [facility];  Service: Urology;  Laterality: Right;   XI ROBOTIC LAPAROSCOPIC ASSISTED APPENDECTOMY Right 06/13/2020   Procedure: XI ROBOTIC LAPAROSCOPIC ASSISTED APPENDECTOMY, RIGHT COLECTOMY;  Surgeon: Campbell Lerner, MD;  Location: [facility];  Service: General;  Laterality: Right;    Home Medications:  Allergies as of 01/05/2023       Reactions  Keflex [cephalexin] Nausea Only        Medication List        Accurate as of Jan 05, 2023 12:50 PM. If you have any questions, ask your nurse or doctor.          STOP taking these  medications    acetaminophen 500 MG tablet Commonly known as: TYLENOL   alendronate 70 MG tablet Commonly known as: FOSAMAX       TAKE these medications    amLODipine 5 MG tablet Commonly known as: NORVASC Take 5 mg by mouth every evening.   CALCIUM 600/VITAMIN D3 PO Take 1-2 tablets by mouth See admin instructions. Take 2 tablets by mouth in the morning and 1 tablet at night   Cranberry 250 MG Tabs Take by mouth.   cyanocobalamin 500 MCG tablet Commonly known as: VITAMIN B12 Take 500 mcg by mouth in the morning and at bedtime.   dorzolamide-timolol 2-0.5 % ophthalmic solution Commonly known as: COSOPT Place 1 drop into both eyes 2 (two) times daily.   famotidine 20 MG tablet Commonly known as: PEPCID TAKE 1 TABLET BY MOUTH TWICE A DAY   lovastatin 40 MG tablet Commonly known as: MEVACOR Take 80 mg by mouth at bedtime.   traZODone 100 MG tablet Commonly known as: DESYREL TAKE 1 TABLET BY MOUTH EVERY NIGHT AT BEDTIME        Allergies:  Allergies  Allergen Reactions   Keflex [Cephalexin] Nausea Only    Family History: Family History  Problem Relation Age of Onset   Dementia Mother    Stroke Father    Hypertension Father    Cancer Neg Hx    Diabetes Neg Hx    Breast cancer Neg Hx     Social History:  reports that she has never smoked. She has never used smokeless tobacco. She reports that she does not drink alcohol and does not use drugs.   Physical Exam: BP (!) 167/88   Pulse 67   Ht 5' (1.524 m)   Wt 151 lb (68.5 kg)   BMI 29.49 kg/m   Constitutional:  Alert and oriented, No acute distress. HEENT: [place] AT, moist mucus membranes.  Trachea midline, no masses. Skin: Irritated mole below the scar, with some white on the surface. Neurologic: Grossly intact, no focal deficits, moving all 4 extremities. Psychiatric: Normal mood and affect.  Pertinent Imaging  KUB was personally reviewed. Radiologic interpretation is pending.   Assessment &  Plan:    1. Bilateral staghorn calculi - Stone burden unchanged from last year, with the largest stone on the left measuring 8mm. - Continue monitoring with annual KUB. - Encourage hydration and cranberry tablets to prevent UTIs.   Return in about 1 year (around 01/05/2024) for repeat KUB.   [facility] [address], [address] [place], [place] [address] [phone]

## 2023-01-05 NOTE — Progress Notes (Unsigned)
Gabrielle Barnes presents for an office/procedure visit. BP today is 167/88  . She is complaint with BP medication. Greater than 140/90. Provider  notified. Pt advised to follow up with PCP. Pt voiced understanding.

## 2023-01-31 DIAGNOSIS — M818 Other osteoporosis without current pathological fracture: Secondary | ICD-10-CM | POA: Diagnosis not present

## 2023-01-31 DIAGNOSIS — E538 Deficiency of other specified B group vitamins: Secondary | ICD-10-CM | POA: Diagnosis not present

## 2023-01-31 DIAGNOSIS — E782 Mixed hyperlipidemia: Secondary | ICD-10-CM | POA: Diagnosis not present

## 2023-02-07 DIAGNOSIS — E538 Deficiency of other specified B group vitamins: Secondary | ICD-10-CM | POA: Diagnosis not present

## 2023-02-07 DIAGNOSIS — Z0000 Encounter for general adult medical examination without abnormal findings: Secondary | ICD-10-CM | POA: Diagnosis not present

## 2023-02-07 DIAGNOSIS — E782 Mixed hyperlipidemia: Secondary | ICD-10-CM | POA: Diagnosis not present

## 2023-02-07 DIAGNOSIS — R739 Hyperglycemia, unspecified: Secondary | ICD-10-CM | POA: Diagnosis not present

## 2023-02-07 DIAGNOSIS — L570 Actinic keratosis: Secondary | ICD-10-CM | POA: Diagnosis not present

## 2023-02-07 DIAGNOSIS — N1831 Chronic kidney disease, stage 3a: Secondary | ICD-10-CM | POA: Diagnosis not present

## 2023-02-07 DIAGNOSIS — M818 Other osteoporosis without current pathological fracture: Secondary | ICD-10-CM | POA: Diagnosis not present

## 2023-02-23 DIAGNOSIS — M8588 Other specified disorders of bone density and structure, other site: Secondary | ICD-10-CM | POA: Diagnosis not present

## 2023-04-13 DIAGNOSIS — H40003 Preglaucoma, unspecified, bilateral: Secondary | ICD-10-CM | POA: Diagnosis not present

## 2023-04-13 DIAGNOSIS — H2513 Age-related nuclear cataract, bilateral: Secondary | ICD-10-CM | POA: Diagnosis not present

## 2023-08-01 IMAGING — CR DG ABDOMEN 1V
1 series · 1 of 1 positions shown · non-contrast
Comparison: Study of 07/07/2021.

CLINICAL DATA: Follow-up for kidney stones.

EXAM:
ABDOMEN - 1 VIEW

[abdomen kub]
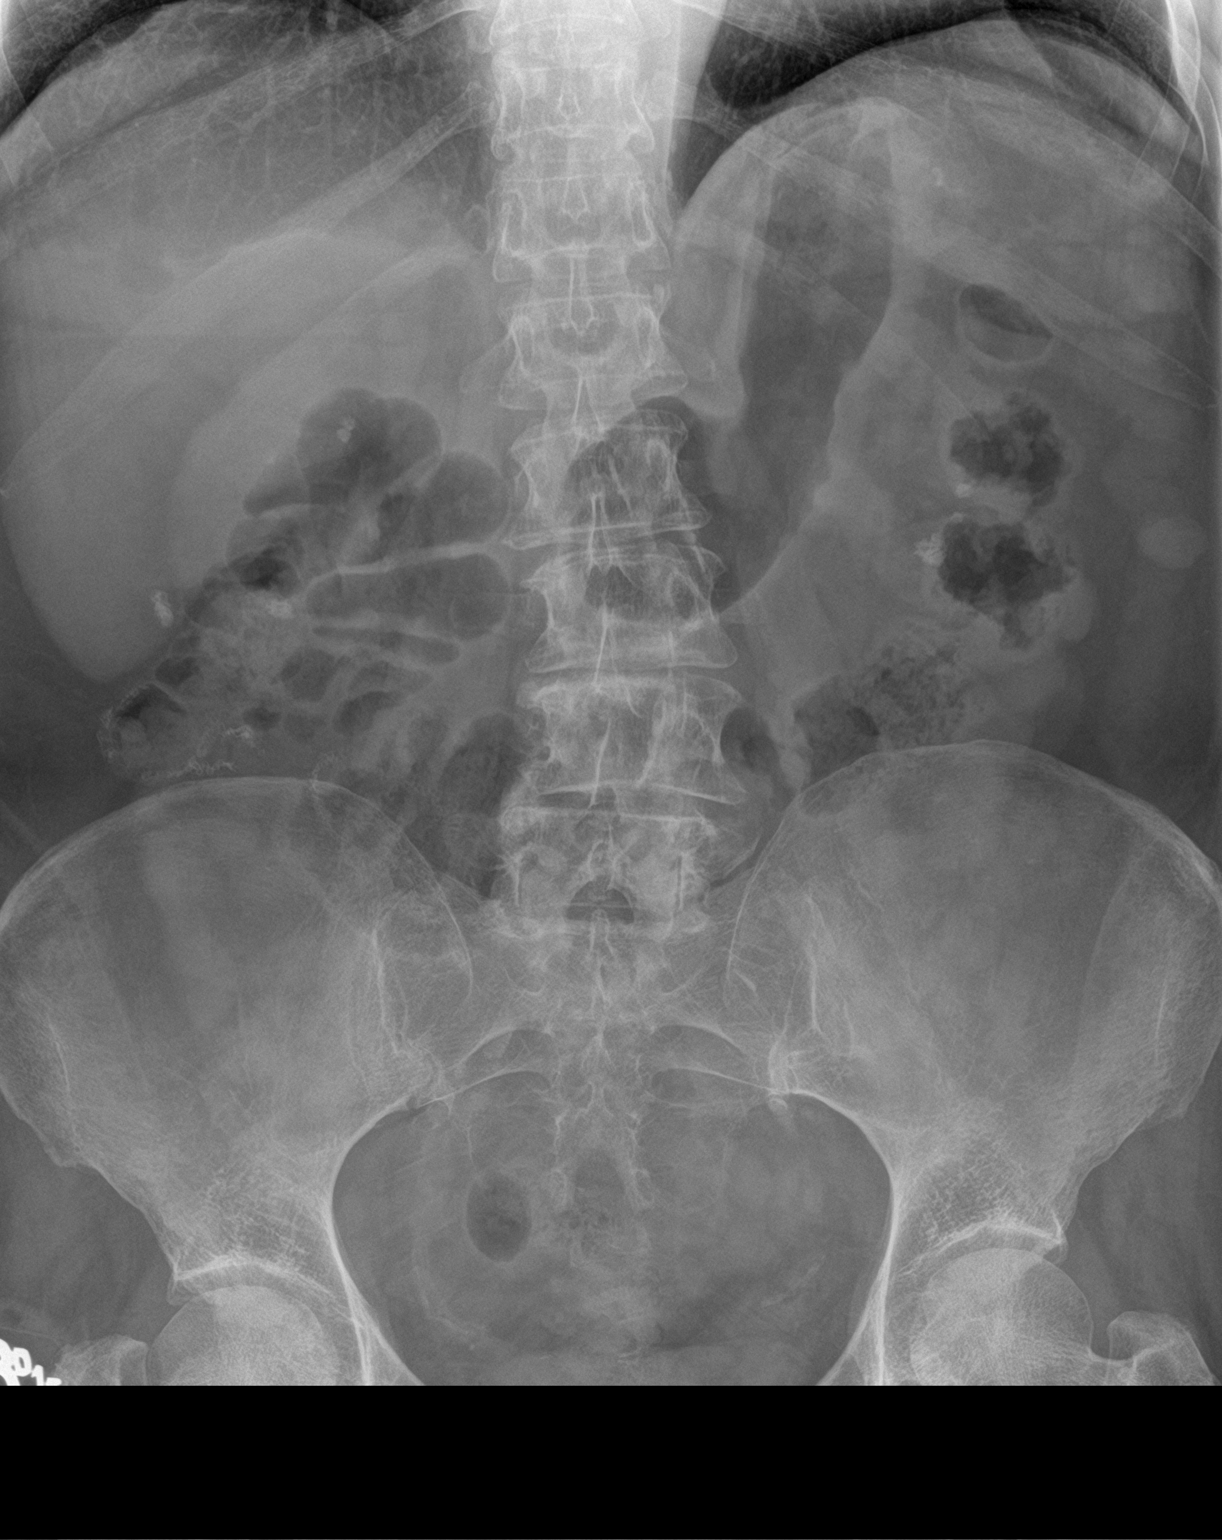

[1 of 1 positions shown; findings below may reference images not displayed]

FINDINGS: The bowel pattern is normal. There are intestinal postsurgical
changes in the right mid to lower abdomen.

Multiple bilateral intrarenal stones are noted on the
right-greater-than-left right ranging from 3 mm up to 8 mm in size
and on the left from 4 mm up to 10 mm.

No new calcification is seen. No calcification is evident along the
plane of either ureter.

There is no supine evidence of free air. The visualized lung bases
are clear. Scoliosis and degenerative changes lumbar spine.
IMPRESSION: No essential change in bilateral nephrolithiasis. No stone is seen
along the plane of either ureter. No new abnormality.

## 2023-08-12 DIAGNOSIS — E538 Deficiency of other specified B group vitamins: Secondary | ICD-10-CM | POA: Diagnosis not present

## 2023-08-12 DIAGNOSIS — E782 Mixed hyperlipidemia: Secondary | ICD-10-CM | POA: Diagnosis not present

## 2023-08-12 DIAGNOSIS — R739 Hyperglycemia, unspecified: Secondary | ICD-10-CM | POA: Diagnosis not present

## 2023-08-17 DIAGNOSIS — L570 Actinic keratosis: Secondary | ICD-10-CM | POA: Diagnosis not present

## 2023-08-17 DIAGNOSIS — N200 Calculus of kidney: Secondary | ICD-10-CM | POA: Diagnosis not present

## 2023-08-17 DIAGNOSIS — E538 Deficiency of other specified B group vitamins: Secondary | ICD-10-CM | POA: Diagnosis not present

## 2023-08-17 DIAGNOSIS — M818 Other osteoporosis without current pathological fracture: Secondary | ICD-10-CM | POA: Diagnosis not present

## 2023-08-17 DIAGNOSIS — E782 Mixed hyperlipidemia: Secondary | ICD-10-CM | POA: Diagnosis not present

## 2023-08-17 DIAGNOSIS — N1831 Chronic kidney disease, stage 3a: Secondary | ICD-10-CM | POA: Diagnosis not present

## 2023-08-17 DIAGNOSIS — R739 Hyperglycemia, unspecified: Secondary | ICD-10-CM | POA: Diagnosis not present

## 2023-10-17 DIAGNOSIS — H40003 Preglaucoma, unspecified, bilateral: Secondary | ICD-10-CM | POA: Diagnosis not present

## 2023-11-15 DIAGNOSIS — D2261 Melanocytic nevi of right upper limb, including shoulder: Secondary | ICD-10-CM | POA: Diagnosis not present

## 2023-11-15 DIAGNOSIS — L821 Other seborrheic keratosis: Secondary | ICD-10-CM | POA: Diagnosis not present

## 2023-11-15 DIAGNOSIS — D225 Melanocytic nevi of trunk: Secondary | ICD-10-CM | POA: Diagnosis not present

## 2023-11-15 DIAGNOSIS — R208 Other disturbances of skin sensation: Secondary | ICD-10-CM | POA: Diagnosis not present

## 2023-11-15 DIAGNOSIS — L820 Inflamed seborrheic keratosis: Secondary | ICD-10-CM | POA: Diagnosis not present

## 2023-11-15 DIAGNOSIS — D235 Other benign neoplasm of skin of trunk: Secondary | ICD-10-CM | POA: Diagnosis not present

## 2023-11-15 DIAGNOSIS — D2262 Melanocytic nevi of left upper limb, including shoulder: Secondary | ICD-10-CM | POA: Diagnosis not present

## 2023-11-15 DIAGNOSIS — L538 Other specified erythematous conditions: Secondary | ICD-10-CM | POA: Diagnosis not present

## 2023-11-15 DIAGNOSIS — D485 Neoplasm of uncertain behavior of skin: Secondary | ICD-10-CM | POA: Diagnosis not present

## 2023-12-23 ENCOUNTER — Other Ambulatory Visit: Payer: Self-pay

## 2023-12-23 DIAGNOSIS — N200 Calculus of kidney: Secondary | ICD-10-CM

## 2023-12-28 ENCOUNTER — Ambulatory Visit: Payer: Self-pay | Admitting: Urology

## 2023-12-28 ENCOUNTER — Ambulatory Visit
Admission: RE | Admit: 2023-12-28 | Discharge: 2023-12-28 | Disposition: A | Discharge status: Home / Self Care | Attending: Urology | Admitting: Urology

## 2023-12-28 ENCOUNTER — Ambulatory Visit
Admission: RE | Admit: 2023-12-28 | Discharge: 2023-12-28 | Disposition: A | Discharge status: Home / Self Care | Source: Ambulatory Visit | Attending: Urology | Admitting: Urology

## 2023-12-28 VITALS — BP 167/91 | HR 64 | Ht 60.0 in | Wt 154.0 lb

## 2023-12-28 DIAGNOSIS — N200 Calculus of kidney: Secondary | ICD-10-CM | POA: Insufficient documentation

## 2023-12-28 DIAGNOSIS — N2889 Other specified disorders of kidney and ureter: Secondary | ICD-10-CM | POA: Diagnosis not present

## 2023-12-28 NOTE — Progress Notes (Signed)
 Gabrielle Barnes,acting as a scribe for Dustin Gimenez, MD.,have documented all relevant documentation on the behalf of Dustin Gimenez, MD,as directed by  Dustin Gimenez, MD while in the presence of Dustin Gimenez, MD.  12/28/23 12:38 PM   Gabrielle Barnes 08/15/1950 [id number]  Referring provider: Sari Cunning, MD [phone] [facility] [address] [facility] [place],  [place] [address]  Chief Complaint  Patient presents with   Follow-up    Staghorn calculus    HPI:  73 year old female with a personal history of kidney stones presents today for an annual follow-up.   She has a personal history of bilateral staghorn calculi and recurrent UTI's. She underwent multiple staged procedures including right and left PCNL followed by ureteroscopy in 2022.    Stone composition is 40% carbonate apatite and 60% struvite.   Over the past year, she has not experienced any stone episodes or infections.   Her recent X-ray shows stable findings compared to last year, with three stones in the right kidney and some embedded stones in the left lower pole. There is a slight increase in size by about a millimeter, but nothing significant.   She has undergone five kidney surgeries in the past and is keen to avoid further interventions unless necessary.   PMH: Past Medical History:  Diagnosis Date   COVID-19 06/2020   Depression    after husbands death   GERD (gastroesophageal reflux disease)    History of chicken pox    History of kidney stones    History of measles    Hypertension    Migraine     Surgical History: Past Surgical History:  Procedure Laterality Date   ABDOMINAL HYSTERECTOMY     APPENDECTOMY     CESAREAN SECTION     x2   colonoscopy     CYSTOSCOPY W/ RETROGRADES  06/01/2021   Procedure: CYSTOSCOPY WITH RETROGRADE PYELOGRAM;  Surgeon: Dustin Gimenez, MD;  Location: [facility];  Service: Urology;;   CYSTOSCOPY WITH HOLMIUM LASER LITHOTRIPSY  08/18/2020    Procedure: HOLMIUM LASER LITHOTRIPSY;  Surgeon: Dustin Gimenez, MD;  Location: [facility];  Service: Urology;;   CYSTOSCOPY/URETEROSCOPY/HOLMIUM LASER/STENT PLACEMENT Left 09/15/2020   Procedure: CYSTOSCOPY/URETEROSCOPY/HOLMIUM LASER/STENT Exchange;  Surgeon: Dustin Gimenez, MD;  Location: [facility];  Service: Urology;  Laterality: Left;   CYSTOSCOPY/URETEROSCOPY/HOLMIUM LASER/STENT PLACEMENT Left 10/16/2020   Procedure: CYSTOSCOPY/URETEROSCOPY/HOLMIUM LASER/STENT Exchange;  Surgeon: Dustin Gimenez, MD;  Location: [facility];  Service: Urology;  Laterality: Left;   CYSTOSCOPY/URETEROSCOPY/HOLMIUM LASER/STENT PLACEMENT Right 06/01/2021   Procedure: CYSTOSCOPY/URETEROSCOPY/HOLMIUM LASER/STENT exchange;  Surgeon: Dustin Gimenez, MD;  Location: [facility];  Service: Urology;  Laterality: Right;   HOLMIUM LASER APPLICATION  04/20/2021   Procedure: HOLMIUM LASER APPLICATION;  Surgeon: Dustin Gimenez, MD;  Location: [facility];  Service: Urology;;   IR NEPHROSTOMY PLACEMENT LEFT  08/18/2020   IR URETERAL STENT RIGHT NEW ACCESS W/O SEP NEPHROSTOMY CATH  04/20/2021   NEPHROLITHOTOMY Left 08/18/2020   Procedure: NEPHROLITHOTOMY PERCUTANEOUS;  Surgeon: Dustin Gimenez, MD;  Location: [facility];  Service: Urology;  Laterality: Left;  possibly appropriate for [facility] 3B   NEPHROLITHOTOMY Right 04/20/2021   Procedure: NEPHROLITHOTOMY PERCUTANEOUS;  Surgeon: Dustin Gimenez, MD;  Location: [facility];  Service: Urology;  Laterality: Right;   XI ROBOTIC LAPAROSCOPIC ASSISTED APPENDECTOMY Right 06/13/2020   Procedure: XI ROBOTIC LAPAROSCOPIC ASSISTED APPENDECTOMY, RIGHT COLECTOMY;  Surgeon: Flynn Hylan, MD;  Location: [facility];  Service: General;  Laterality: Right;    Home Medications:  Allergies as of  12/28/2023       Reactions   Keflex  [cephalexin ] Nausea Only        Medication List        Accurate as of Dec 28, 2023 12:38 PM. If you have any questions, ask your nurse or doctor.          alendronate   70 MG tablet Commonly known as: FOSAMAX  Take 70 mg by mouth once a week.   amLODipine  5 MG tablet Commonly known as: NORVASC  Take 5 mg by mouth every evening.   CALCIUM 600/VITAMIN D3 PO Take 1-2 tablets by mouth See admin instructions. Take 2 tablets by mouth in the morning and 1 tablet at night   Cranberry 250 MG Tabs Take by mouth.   cyanocobalamin  500 MCG tablet Commonly known as: VITAMIN B12 Take 500 mcg by mouth in the morning and at bedtime.   dorzolamide -timolol  2-0.5 % ophthalmic solution Commonly known as: COSOPT  Place 1 drop into both eyes 2 (two) times daily.   famotidine  20 MG tablet Commonly known as: PEPCID  TAKE 1 TABLET BY MOUTH TWICE A DAY   lovastatin  40 MG tablet Commonly known as: MEVACOR  Take 80 mg by mouth at bedtime.   NEOMYCIN-POLYMYXIN-HYDROCORTISONE 1 % Soln OTIC solution Commonly known as: CORTISPORIN   temazepam 30 MG capsule Commonly known as: RESTORIL Take 30 mg by mouth at bedtime as needed.   traZODone  100 MG tablet Commonly known as: DESYREL  TAKE 1 TABLET BY MOUTH EVERY NIGHT AT BEDTIME        Allergies:  Allergies  Allergen Reactions   Keflex  [Cephalexin ] Nausea Only    Family History: Family History  Problem Relation Age of Onset   Dementia Mother    Stroke Father    Hypertension Father    Cancer Neg Hx    Diabetes Neg Hx    Breast cancer Neg Hx     Social History:  reports that she has never smoked. She has never used smokeless tobacco. She reports that she does not drink alcohol and does not use drugs.   Physical Exam: BP (!) 167/91   Pulse 64   Ht 5' (1.524 m)   Wt 154 lb (69.9 kg)   BMI 30.08 kg/m   Constitutional:  Alert and oriented, No acute distress. HEENT: [place] AT, moist mucus membranes.  Trachea midline, no masses. Neurologic: Grossly intact, no focal deficits, moving all 4 extremities. Psychiatric: Normal mood and affect.   Pertinent Imaging:   KUB today was personally reviewed. Radiologic  interpretation is still pending.  Compared to her previous x-ray, stones appear relatively stable, some at this point likely intraparenchymal.  Overall dramatic reduction in stone burden.  Assessment & Plan:    1. Stable kidney stones - Imaging reveals three stable stones in the left kidney and some right lower-pole stones. There is no significant change in size or number compared to last year. She reports no episodes of pain or infections this year.  - Continue current management with dietary modifications, including increased fluid intake and citric acid supplementation (lemon in water ). Monitor salt intake. - Schedule follow-up in one year with a KUB X-ray. She will be seen by a PA for the follow-up.  - Instruct her to report any new symptoms such as flank pain or urinary tract infections immediately for earlier intervention if necessary.  Return in about 1 year (around 12/27/2024) for PA visit with KUB.  I have reviewed the above documentation for accuracy and completeness, and I agree with the  above.   Dustin Gimenez, MD   [facility] [address], [address] [place], [place] [address] [phone]

## 2024-01-04 ENCOUNTER — Ambulatory Visit: Payer: PPO | Admitting: Urology

## 2024-02-08 DIAGNOSIS — E538 Deficiency of other specified B group vitamins: Secondary | ICD-10-CM | POA: Diagnosis not present

## 2024-02-08 DIAGNOSIS — N200 Calculus of kidney: Secondary | ICD-10-CM | POA: Diagnosis not present

## 2024-02-08 DIAGNOSIS — R739 Hyperglycemia, unspecified: Secondary | ICD-10-CM | POA: Diagnosis not present

## 2024-02-08 DIAGNOSIS — E782 Mixed hyperlipidemia: Secondary | ICD-10-CM | POA: Diagnosis not present

## 2024-02-15 DIAGNOSIS — E538 Deficiency of other specified B group vitamins: Secondary | ICD-10-CM | POA: Diagnosis not present

## 2024-02-15 DIAGNOSIS — E782 Mixed hyperlipidemia: Secondary | ICD-10-CM | POA: Diagnosis not present

## 2024-02-15 DIAGNOSIS — Z1231 Encounter for screening mammogram for malignant neoplasm of breast: Secondary | ICD-10-CM | POA: Diagnosis not present

## 2024-02-15 DIAGNOSIS — Z1211 Encounter for screening for malignant neoplasm of colon: Secondary | ICD-10-CM | POA: Diagnosis not present

## 2024-02-15 DIAGNOSIS — Z0000 Encounter for general adult medical examination without abnormal findings: Secondary | ICD-10-CM | POA: Diagnosis not present

## 2024-02-15 DIAGNOSIS — M818 Other osteoporosis without current pathological fracture: Secondary | ICD-10-CM | POA: Diagnosis not present

## 2024-02-15 DIAGNOSIS — R739 Hyperglycemia, unspecified: Secondary | ICD-10-CM | POA: Diagnosis not present

## 2024-03-08 DIAGNOSIS — Z1211 Encounter for screening for malignant neoplasm of colon: Secondary | ICD-10-CM | POA: Diagnosis not present

## 2024-03-08 DIAGNOSIS — N1831 Chronic kidney disease, stage 3a: Secondary | ICD-10-CM | POA: Diagnosis not present

## 2024-03-08 DIAGNOSIS — I10 Essential (primary) hypertension: Secondary | ICD-10-CM | POA: Diagnosis not present

## 2024-04-23 DIAGNOSIS — H524 Presbyopia: Secondary | ICD-10-CM | POA: Diagnosis not present

## 2024-04-23 DIAGNOSIS — H2513 Age-related nuclear cataract, bilateral: Secondary | ICD-10-CM | POA: Diagnosis not present

## 2024-04-23 DIAGNOSIS — H40003 Preglaucoma, unspecified, bilateral: Secondary | ICD-10-CM | POA: Diagnosis not present

## 2024-06-11 DIAGNOSIS — L821 Other seborrheic keratosis: Secondary | ICD-10-CM | POA: Diagnosis not present

## 2024-06-11 DIAGNOSIS — D2262 Melanocytic nevi of left upper limb, including shoulder: Secondary | ICD-10-CM | POA: Diagnosis not present

## 2024-06-11 DIAGNOSIS — D485 Neoplasm of uncertain behavior of skin: Secondary | ICD-10-CM | POA: Diagnosis not present

## 2024-06-11 DIAGNOSIS — D2261 Melanocytic nevi of right upper limb, including shoulder: Secondary | ICD-10-CM | POA: Diagnosis not present

## 2024-06-11 DIAGNOSIS — D225 Melanocytic nevi of trunk: Secondary | ICD-10-CM | POA: Diagnosis not present

## 2024-06-11 DIAGNOSIS — L538 Other specified erythematous conditions: Secondary | ICD-10-CM | POA: Diagnosis not present

## 2024-06-11 DIAGNOSIS — R208 Other disturbances of skin sensation: Secondary | ICD-10-CM | POA: Diagnosis not present

## 2024-06-11 DIAGNOSIS — L820 Inflamed seborrheic keratosis: Secondary | ICD-10-CM | POA: Diagnosis not present

## 2024-06-11 DIAGNOSIS — D2271 Melanocytic nevi of right lower limb, including hip: Secondary | ICD-10-CM | POA: Diagnosis not present

## 2024-06-11 DIAGNOSIS — D2272 Melanocytic nevi of left lower limb, including hip: Secondary | ICD-10-CM | POA: Diagnosis not present

## 2024-12-27 ENCOUNTER — Ambulatory Visit: Admitting: Physician Assistant
